# Patient Record
Sex: Male | Born: 1950 | Race: White | Hispanic: No | Marital: Married | Smoking: Former smoker
Health system: Southern US, Community
[De-identification: ages and names within clinical notes are randomized; demographics above are authoritative.]

## PROBLEM LIST (undated history)

## (undated) DIAGNOSIS — R011 Cardiac murmur, unspecified: Secondary | ICD-10-CM

## (undated) DIAGNOSIS — I10 Essential (primary) hypertension: Secondary | ICD-10-CM

## (undated) DIAGNOSIS — H269 Unspecified cataract: Secondary | ICD-10-CM

## (undated) DIAGNOSIS — I4891 Unspecified atrial fibrillation: Secondary | ICD-10-CM

## (undated) DIAGNOSIS — L409 Psoriasis, unspecified: Secondary | ICD-10-CM

## (undated) DIAGNOSIS — I4892 Unspecified atrial flutter: Secondary | ICD-10-CM

## (undated) DIAGNOSIS — G629 Polyneuropathy, unspecified: Secondary | ICD-10-CM

## (undated) DIAGNOSIS — G20 Parkinson's disease: Secondary | ICD-10-CM

## (undated) DIAGNOSIS — G4700 Insomnia, unspecified: Secondary | ICD-10-CM

## (undated) DIAGNOSIS — R600 Localized edema: Secondary | ICD-10-CM

## (undated) DIAGNOSIS — G20C Parkinsonism, unspecified: Secondary | ICD-10-CM

## (undated) DIAGNOSIS — R609 Edema, unspecified: Secondary | ICD-10-CM

## (undated) DIAGNOSIS — Z87440 Personal history of urinary (tract) infections: Secondary | ICD-10-CM

## (undated) DIAGNOSIS — E785 Hyperlipidemia, unspecified: Secondary | ICD-10-CM

## (undated) DIAGNOSIS — K649 Unspecified hemorrhoids: Secondary | ICD-10-CM

## (undated) DIAGNOSIS — N400 Enlarged prostate without lower urinary tract symptoms: Secondary | ICD-10-CM

## (undated) HISTORY — PX: TONSILLECTOMY: SUR1361

## (undated) HISTORY — DX: Parkinsonism, unspecified: G20.C

## (undated) HISTORY — DX: Unspecified atrial fibrillation: I48.91

## (undated) HISTORY — DX: Cardiac murmur, unspecified: R01.1

## (undated) HISTORY — PX: POLYPECTOMY: SHX149

## (undated) HISTORY — DX: Psoriasis, unspecified: L40.9

## (undated) HISTORY — DX: Unspecified atrial flutter: I48.92

## (undated) HISTORY — DX: Essential (primary) hypertension: I10

## (undated) HISTORY — DX: Polyneuropathy, unspecified: G62.9

## (undated) HISTORY — PX: TOOTH EXTRACTION: SUR596

## (undated) HISTORY — DX: Parkinson's disease: G20

## (undated) HISTORY — DX: Personal history of urinary (tract) infections: Z87.440

## (undated) HISTORY — DX: Hyperlipidemia, unspecified: E78.5

---

## 2000-01-01 ENCOUNTER — Encounter: Payer: Self-pay | Admitting: Internal Medicine

## 2003-06-07 ENCOUNTER — Encounter: Admission: RE | Admit: 2003-06-07 | Discharge: 2003-06-07 | Payer: Self-pay | Admitting: Family Medicine

## 2003-06-21 ENCOUNTER — Encounter: Admission: RE | Admit: 2003-06-21 | Discharge: 2003-06-21 | Payer: Self-pay | Admitting: Family Medicine

## 2003-06-28 ENCOUNTER — Encounter: Payer: Self-pay | Admitting: Internal Medicine

## 2003-07-02 ENCOUNTER — Encounter: Payer: Self-pay | Admitting: Internal Medicine

## 2004-11-18 ENCOUNTER — Emergency Department (HOSPITAL_COMMUNITY): Admission: EM | Admit: 2004-11-18 | Discharge: 2004-11-18 | Payer: Self-pay | Admitting: Emergency Medicine

## 2004-12-01 ENCOUNTER — Encounter: Admission: RE | Admit: 2004-12-01 | Discharge: 2004-12-01 | Payer: Self-pay | Admitting: Family Medicine

## 2006-12-03 ENCOUNTER — Ambulatory Visit: Payer: Self-pay | Admitting: Gastroenterology

## 2006-12-20 ENCOUNTER — Ambulatory Visit: Payer: Self-pay | Admitting: Gastroenterology

## 2006-12-20 ENCOUNTER — Encounter: Payer: Self-pay | Admitting: Gastroenterology

## 2006-12-20 ENCOUNTER — Encounter: Payer: Self-pay | Admitting: Internal Medicine

## 2008-02-04 ENCOUNTER — Encounter: Payer: Self-pay | Admitting: Internal Medicine

## 2008-02-04 LAB — CONVERTED CEMR LAB: HDL: 55 mg/dL

## 2008-06-07 ENCOUNTER — Encounter (INDEPENDENT_AMBULATORY_CARE_PROVIDER_SITE_OTHER): Payer: Self-pay | Admitting: *Deleted

## 2008-07-29 ENCOUNTER — Ambulatory Visit: Payer: Self-pay | Admitting: Internal Medicine

## 2008-07-29 DIAGNOSIS — J309 Allergic rhinitis, unspecified: Secondary | ICD-10-CM | POA: Insufficient documentation

## 2008-07-29 DIAGNOSIS — G20 Parkinson's disease: Secondary | ICD-10-CM

## 2008-07-29 DIAGNOSIS — E785 Hyperlipidemia, unspecified: Secondary | ICD-10-CM

## 2008-07-29 DIAGNOSIS — I421 Obstructive hypertrophic cardiomyopathy: Secondary | ICD-10-CM | POA: Insufficient documentation

## 2008-07-30 ENCOUNTER — Telehealth: Payer: Self-pay | Admitting: Internal Medicine

## 2008-07-30 ENCOUNTER — Telehealth (INDEPENDENT_AMBULATORY_CARE_PROVIDER_SITE_OTHER): Payer: Self-pay | Admitting: *Deleted

## 2008-08-12 ENCOUNTER — Telehealth: Payer: Self-pay | Admitting: Internal Medicine

## 2008-08-12 ENCOUNTER — Encounter: Payer: Self-pay | Admitting: Internal Medicine

## 2008-09-25 DIAGNOSIS — I4891 Unspecified atrial fibrillation: Secondary | ICD-10-CM

## 2008-09-25 DIAGNOSIS — I4892 Unspecified atrial flutter: Secondary | ICD-10-CM

## 2008-09-25 HISTORY — DX: Unspecified atrial flutter: I48.92

## 2008-09-25 HISTORY — DX: Unspecified atrial fibrillation: I48.91

## 2008-10-14 ENCOUNTER — Encounter: Payer: Self-pay | Admitting: Internal Medicine

## 2008-10-20 ENCOUNTER — Encounter: Payer: Self-pay | Admitting: Internal Medicine

## 2008-10-22 ENCOUNTER — Emergency Department (HOSPITAL_COMMUNITY): Admission: EM | Admit: 2008-10-22 | Discharge: 2008-10-22 | Payer: Self-pay | Admitting: Emergency Medicine

## 2008-10-22 ENCOUNTER — Ambulatory Visit: Payer: Self-pay | Admitting: Internal Medicine

## 2008-10-22 LAB — CONVERTED CEMR LAB
Glucose, Bld: 149 mg/dL
Hemoglobin: 15.3 g/dL

## 2008-10-28 ENCOUNTER — Encounter: Payer: Self-pay | Admitting: Internal Medicine

## 2008-10-29 ENCOUNTER — Ambulatory Visit: Payer: Self-pay | Admitting: Internal Medicine

## 2008-11-03 ENCOUNTER — Encounter: Payer: Self-pay | Admitting: Internal Medicine

## 2008-11-03 ENCOUNTER — Ambulatory Visit: Payer: Self-pay

## 2008-11-08 ENCOUNTER — Ambulatory Visit: Payer: Self-pay | Admitting: Internal Medicine

## 2008-11-08 DIAGNOSIS — I4891 Unspecified atrial fibrillation: Secondary | ICD-10-CM

## 2008-11-08 DIAGNOSIS — I4892 Unspecified atrial flutter: Secondary | ICD-10-CM

## 2008-11-17 ENCOUNTER — Ambulatory Visit: Payer: Self-pay

## 2008-12-22 ENCOUNTER — Ambulatory Visit: Payer: Self-pay | Admitting: Internal Medicine

## 2008-12-24 ENCOUNTER — Ambulatory Visit: Payer: Self-pay | Admitting: Cardiology

## 2008-12-27 ENCOUNTER — Telehealth: Payer: Self-pay | Admitting: Cardiology

## 2009-01-03 ENCOUNTER — Ambulatory Visit: Payer: Self-pay | Admitting: Cardiology

## 2009-01-03 ENCOUNTER — Encounter: Payer: Self-pay | Admitting: Cardiology

## 2009-01-03 LAB — CONVERTED CEMR LAB
Chloride: 107 meq/L (ref 96–112)
GFR calc non Af Amer: 72.85 mL/min (ref >60)
Glucose, Bld: 116 mg/dL — ABNORMAL HIGH (ref 70–99)
Potassium: 3.9 meq/L (ref 3.5–5.1)
Sodium: 143 meq/L (ref 135–145)

## 2009-02-01 ENCOUNTER — Ambulatory Visit: Payer: Self-pay

## 2009-02-01 ENCOUNTER — Ambulatory Visit: Payer: Self-pay | Admitting: Cardiology

## 2009-02-09 ENCOUNTER — Ambulatory Visit: Payer: Self-pay | Admitting: Cardiology

## 2009-02-14 ENCOUNTER — Ambulatory Visit: Payer: Self-pay | Admitting: Internal Medicine

## 2009-02-18 ENCOUNTER — Ambulatory Visit: Payer: Self-pay | Admitting: Internal Medicine

## 2009-02-22 ENCOUNTER — Encounter (INDEPENDENT_AMBULATORY_CARE_PROVIDER_SITE_OTHER): Payer: Self-pay | Admitting: *Deleted

## 2009-02-22 LAB — CONVERTED CEMR LAB
Cholesterol: 155 mg/dL (ref 0–200)
LDL Cholesterol: 96 mg/dL (ref 0–99)
Triglycerides: 83 mg/dL (ref 0.0–149.0)

## 2009-02-25 ENCOUNTER — Ambulatory Visit: Payer: Self-pay | Admitting: Internal Medicine

## 2009-03-16 ENCOUNTER — Ambulatory Visit: Payer: Self-pay | Admitting: Internal Medicine

## 2009-04-11 ENCOUNTER — Ambulatory Visit: Payer: Self-pay | Admitting: Family

## 2009-04-11 DIAGNOSIS — Z87898 Personal history of other specified conditions: Secondary | ICD-10-CM

## 2009-04-12 ENCOUNTER — Encounter: Payer: Self-pay | Admitting: Family

## 2009-04-18 ENCOUNTER — Ambulatory Visit: Payer: Self-pay | Admitting: Internal Medicine

## 2009-05-10 ENCOUNTER — Telehealth: Payer: Self-pay | Admitting: Internal Medicine

## 2009-07-29 ENCOUNTER — Ambulatory Visit: Payer: Self-pay | Admitting: Cardiology

## 2009-09-01 ENCOUNTER — Telehealth (INDEPENDENT_AMBULATORY_CARE_PROVIDER_SITE_OTHER): Payer: Self-pay | Admitting: *Deleted

## 2009-09-08 ENCOUNTER — Encounter: Payer: Self-pay | Admitting: Internal Medicine

## 2009-11-07 ENCOUNTER — Telehealth: Payer: Self-pay | Admitting: Cardiology

## 2010-02-14 ENCOUNTER — Ambulatory Visit: Payer: Self-pay | Admitting: Cardiology

## 2010-03-06 ENCOUNTER — Telehealth (INDEPENDENT_AMBULATORY_CARE_PROVIDER_SITE_OTHER): Payer: Self-pay | Admitting: *Deleted

## 2010-03-13 ENCOUNTER — Ambulatory Visit: Payer: Self-pay | Admitting: Internal Medicine

## 2010-04-04 ENCOUNTER — Ambulatory Visit: Payer: Self-pay | Admitting: Internal Medicine

## 2010-04-11 ENCOUNTER — Ambulatory Visit: Payer: Self-pay | Admitting: Internal Medicine

## 2010-04-17 LAB — CONVERTED CEMR LAB
AST: 32 units/L (ref 0–37)
Albumin: 4.1 g/dL (ref 3.5–5.2)
Alkaline Phosphatase: 48 units/L (ref 39–117)
CO2: 25 meq/L (ref 19–32)
Eosinophils Relative: 3.6 % (ref 0.0–5.0)
Glucose, Bld: 109 mg/dL — ABNORMAL HIGH (ref 70–99)
HDL: 49.8 mg/dL (ref >39.00)
Monocytes Relative: 9.9 % (ref 3.0–12.0)
Neutrophils Relative %: 54.8 % (ref 43.0–77.0)
PSA: 1.21 ng/mL (ref 0.10–4.00)
Platelets: 121 10*3/uL — ABNORMAL LOW (ref 150.0–400.0)
Potassium: 4 meq/L (ref 3.5–5.1)
Sodium: 138 meq/L (ref 135–145)
Total CHOL/HDL Ratio: 3
Total Protein: 6.1 g/dL (ref 6.0–8.3)
VLDL: 14.8 mg/dL (ref 0.0–40.0)
WBC: 5.3 10*3/uL (ref 4.5–10.5)

## 2010-06-27 NOTE — Assessment & Plan Note (Signed)
Summary: 6 MO F/U/CY  Medications Added NORPACE CR 150 MG XR12H-CAP (DISOPYRAMIDE PHOSPHATE) Take one capsule by mouth twice a day AMOXICILLIN 500 MG CAPS (AMOXICILLIN) 4 tabs pre dental work BETAMETHASONE DIPROPIONATE 0.05 % CREA (BETAMETHASONE DIPROPIONATE) as needed      Allergies Added: NKDA  Visit Type:  6 mo f/u Primary Provider:  Nolon Rod. Paz MD  CC:  no cardiac complaints today.  History of Present Illness: Jeff [name] comes in today for continued evaluation and management of his hypertrophic cardiomyopathy, history of atrial fibrillation/flutter.  He's had no breakthroughs since his last visit. The Norpace is no nausea. He has had no exertional palpitations, presyncope, or chest pain.  We performed an exercise treadmill in September. He had good exercise tolerance, appropriate increase in heart rate and blood pressure, and no arrhythmias.  Current Medications (verified): 1)  Selseb 2.25 % Sham (Selenium Sulf-Pyrithione-Urea) .... As Directed 2)  Westcort 0.2 % Oint (Hydrocortisone Valerate) .... Apply As Directed (Dr Earlene Plater) 3)  Simvastatin 40 Mg Tabs (Simvastatin) .Marland Kitchen.. 1 By Mouth At Bedtime 4)  Singulair 10 Mg Tabs (Montelukast Sodium) .... Once Daily 5)  Metoprolol Succinate 25 Mg Xr24h-Tab (Metoprolol Succinate) .... Take One Tablet By Mouth Daily 6)  Norpace Cr 150 Mg Xr12h-Cap (Disopyramide Phosphate) .... Take One Capsule By Mouth Twice A Day 7)  Aspirin Ec 325 Mg Tbec (Aspirin) .... Take One Tablet By Mouth Daily 8)  Amoxicillin 500 Mg Caps (Amoxicillin) .... 4 Tabs Pre Dental Work 9)  Betamethasone Dipropionate 0.05 % Crea (Betamethasone Dipropionate) .... As Needed  Allergies (verified): No Known Drug Allergies  Past History:  Past Medical History: Last updated: 04/18/2009 Hyperlipidemia hypertryphic obstructive cardio-myopathy ( Dx aprox 2000, was seen @  [facility],  had a ECHO, was offered a cath but declined) h/o Afib-A, h/o A Flutter ---09-2008 h/p  psoriais Allergic rhinitis  Past Surgical History: Last updated: 07/29/2008 Tonsillectomy  Family History: Last updated: 01/27/2009 CAD - no heart murmur--brother  HTN - no DM - no stroke - no colon Ca -no prostate Ca -no M - living F - living Alz  Social History: Last updated: 07/29/2008 Occupation: Runner, 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) , now works at [employer] and [employer] a bike  in [place] since 1974  Married no children  Risk Factors: Alcohol Use: 1 (07/29/2008)  Risk Factors: Smoking Status: current (07/29/2008)  Review of Systems       negative history of present illness  Vital Signs:  Patient profile:   60 year old male Height:      75.5 inches Weight:      223 pounds BMI:     27.60 Pulse rate:   76 / minute Pulse rhythm:   regular BP sitting:   92 / 60  (left arm)  Vitals Entered By: Jeff [name], CMA (July 29, 2009 10:16 AM)  Physical Exam  General:  Well developed, well nourished, in no acute distress. Head:  normocephalic and atraumatic Eyes:  PERRLA/EOM intact; conjunctiva and lids normal. Neck:  Neck supple, no JVD. No masses, thyromegaly or abnormal cervical nodes. Chest Tationa Stech:  no deformities or breast masses noted Lungs:  Clear bilaterally to auscultation and percussion. Heart:  PMI dynamic, systolic murmur along left sternal border, regular rate and rhythm, S2 split Abdomen:  Bowel sounds positive; abdomen soft and non-tender without masses, organomegaly, or hernias noted. No hepatosplenomegaly. Msk:  Back normal, normal gait. Muscle strength and tone normal. Pulses:  pulses normal in all 4 extremities Extremities:  No clubbing or cyanosis. Neurologic:  Alert and oriented x 3. Skin:  Intact without lesions or rashes. Psych:  Normal affect.   Impression & Recommendations:  Problem # 1:  ATRIAL FIBRILLATION (ICD-427.31) Assessment Improved  His updated medication list for this  problem includes:    Metoprolol Succinate 25 Mg Xr24h-tab (Metoprolol succinate) .Marland Kitchen... Take one tablet by mouth daily    Norpace Cr 150 Mg Xr12h-cap (Disopyramide phosphate) .Marland Kitchen... Take one capsule by mouth twice a day    Aspirin Ec 325 Mg Tbec (Aspirin) .Marland Kitchen... Take one tablet by mouth daily  Problem # 2:  ATRIAL FLUTTER (ICD-427.32) Assessment: Improved  His updated medication list for this problem includes:    Metoprolol Succinate 25 Mg Xr24h-tab (Metoprolol succinate) .Marland Kitchen... Take one tablet by mouth daily    Norpace Cr 150 Mg Xr12h-cap (Disopyramide phosphate) .Marland Kitchen... Take one capsule by mouth twice a day    Aspirin Ec 325 Mg Tbec (Aspirin) .Marland Kitchen... Take one tablet by mouth daily  Problem # 3:  HYPERTROPHIC OBSTRUCTIVE CARDIOMYOPATHY (ICD-425.1) Assessment: Unchanged  His updated medication list for this problem includes:    Metoprolol Succinate 25 Mg Xr24h-tab (Metoprolol succinate) .Marland Kitchen... Take one tablet by mouth daily    Norpace Cr 150 Mg Xr12h-cap (Disopyramide phosphate) .Marland Kitchen... Take one capsule by mouth twice a day    Aspirin Ec 325 Mg Tbec (Aspirin) .Marland Kitchen... Take one tablet by mouth daily  Patient Instructions: 1)  Your physician recommends that you schedule a follow-up appointment in: 6 months with dr Melaya Hoselton due sept 2)  Your physician recommends that you continue on your current medications as directed. Please refer to the Current Medication list given to you today.

## 2010-06-27 NOTE — Assessment & Plan Note (Signed)
Summary: cpx/kn   Vital Signs:  Patient profile:   60 year old male Height:      75.5 inches Weight:      225.13 pounds Pulse rate:   78 / minute Pulse rhythm:   regular BP sitting:   116 / 80  (left arm) Cuff size:   large  Vitals Entered By: Army Fossa CMA (April 04, 2010 1:15 PM) CC: CPX, not fasting Comments c/o right bicep pain x 3 weeks Tremors in right hand tingle in left leg [facility] [address] declines TD    History of Present Illness: CPX  R biceps pain x 3 weeks after he tried to reach across   tremor R hand is  increasing  tingling R leg , started a few months ago, symptom is now extending to the knee and is almost constant. Tingling described as the feeling one have  after a leg get sleepy  Preventive Screening-Counseling & Management  Caffeine-Diet-Exercise     Does Patient Exercise: yes     Times/week: 2  Current Medications (verified): 1)  Selseb 2.25 % Sham (Selenium Sulf-Pyrithione-Urea) .... As Directed 2)  Westcort 0.2 % Oint (Hydrocortisone Valerate) .... Apply As Directed (Dr Earlene Plater) 3)  Simvastatin 40 Mg Tabs (Simvastatin) .Marland Kitchen.. 1 By Mouth At Bedtime 4)  Singulair 10 Mg Tabs (Montelukast Sodium) .... Once Daily 5)  Metoprolol Succinate 25 Mg Xr24h-Tab (Metoprolol Succinate) .... Take One Tablet By Mouth Daily 6)  Norpace Cr 150 Mg Xr12h-Cap (Disopyramide Phosphate) .... Take One Capsule By Mouth Twice A Day 7)  Aspirin Ec 325 Mg Tbec (Aspirin) .... Take One Tablet By Mouth Daily 8)  Amoxicillin 500 Mg Caps (Amoxicillin) .... 4 Tabs Pre Dental Work 9)  Betamethasone Dipropionate 0.05 % Crea (Betamethasone Dipropionate) .... As Needed 10)  Dhs Zinc Shampoo  Allergies (verified): No Known Drug Allergies  Past History:  Past Medical History: Reviewed history from 04/18/2009 and no changes required. Hyperlipidemia hypertryphic obstructive cardio-myopathy ( Dx aprox 2000, was seen @  [facility],  had a ECHO, was offered a cath but  declined) h/o Afib-A, h/o A Flutter ---09-2008 h/p psoriais Allergic rhinitis  Past Surgical History: Reviewed history from 07/29/2008 and no changes required. Tonsillectomy  Family History: Reviewed history from 01/27/2009 and no changes required. 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 Resp:  Denies cough and shortness of breath. GI:  Denies bloody stools, diarrhea, nausea, and vomiting. GU:  Denies dysuria, hematuria, urinary frequency, and urinary hesitancy. Neuro:  no back pain No bladder or bowel incontinence No headaches No visual disturbances. Psych:  Denies anxiety and depression.  Physical Exam  General:  alert, well-developed, and well-nourished.   Lungs:  Normal respiratory effort, chest expands symmetrically. Lungs are clear to auscultation, no crackles or wheezes. Heart:  grade 2-3 systolic murmur noted Abdomen:  soft, non-tender, no distention, no masses, no guarding, and no rigidity.   Rectal:  No external abnormalities noted. Normal sphincter tone. No rectal masses or tenderness. Prostate:  Prostate  gland firm and smooth, no enlargement, nodularity, tenderness, mass, asymmetry or induration. Msk:  inspection and palpation of the upper extremities muscles normal Extremities:  No  lower  extremity edema Neurologic:  alert & oriented X3, cranial nerves II-XII intact (chronic, slt weaker L up aper eyelid ), strength normal in all extremities, sensation intact to light touch, sensation intact to  pinprick, gait normal, and DTRs symmetrical , slr decrease ankle jerk B  Psych:  not anxious appearing and not depressed appearing.     Impression & Recommendations:  Problem # 1:  ROUTINE GENERAL MEDICAL EXAM@HEALTH  CARE FACL (ICD-V70.0) Td  ~ 2009  (patient no sure) had a flu shot  Colonoscopy:   Date:  11/24/202007   Results:  polyp  Next colonoscopy per GI  diet, exercise discussed Labs  Problem # 2:  ESSENTIAL AND OTHER SPECIFIED FORMS OF TREMOR (ICD-333.1) tremor getting worse has an unexplained paresthesia in the  R  leg Plan   referral to neurology ([place])  Orders: Neurology Referral (Neuro)  Problem # 3:  right arm sprain rest, Motrin  Complete Medication List: 1)  Selseb 2.25 % Sham (Selenium sulf-pyrithione-urea) .... As directed 2)  Westcort 0.2 % Oint (Hydrocortisone valerate) .... Apply as directed (dr Earlene Plater) 3)  Simvastatin 40 Mg Tabs (Simvastatin) .Marland Kitchen.. 1 by mouth at bedtime 4)  Singulair 10 Mg Tabs (Montelukast sodium) .... Once daily 5)  Metoprolol Succinate 25 Mg Xr24h-tab (Metoprolol succinate) .... Take one tablet by mouth daily 6)  Norpace Cr 150 Mg Xr12h-cap (Disopyramide phosphate) .... Take one capsule by mouth twice a day 7)  Aspirin Ec 325 Mg Tbec (Aspirin) .... Take one tablet by mouth daily 8)  Amoxicillin 500 Mg Caps (Amoxicillin) .... 4 tabs pre dental work 9)  Betamethasone Dipropionate 0.05 % Crea (Betamethasone dipropionate) .... As needed 10)  Dhs Zinc Shampoo   Patient Instructions: 1)  come back fasting 2)  FLP, CBC, BMP, LFTs, TSH, PSA--- dx-- v70 3)  B12, folic acid, VDRL--- dx  paresthesia 4)  Please schedule a follow-up appointment in 6 months .  Prescriptions: SELSEB 2.25 % SHAM (SELENIUM SULF-PYRITHIONE-UREA) as directed  #120 Millilit x 5   Entered by:   Army Fossa CMA   Authorized by:   Nolon Rod. Paz MD   Signed by:   Nolon Rod. Paz MD on 04/05/2010   Method used:   Electronically to        [id number]  (retail)       [address] [place], [place]  [address]       Ph: [phone]       Fax: [phone]   RxID:   [id number] SIMVASTATIN 40 MG TABS (SIMVASTATIN) 1 by mouth at bedtime  #90 Each x 2   Entered by:   Army Fossa CMA   Authorized by:   Nolon Rod. Paz MD   Signed by:   Nolon Rod. Paz MD on 04/05/2010   Method used:   Electronically to        [id number]* (retail)       [address] [place], [place]  [address]       Ph: [phone]       Fax: [phone]   RxID:   [id number]    Orders Added: 1)  Neurology Referral [Neuro] 2)  Est. Patient age 53-64 [96]  +   Risk Factors:  Exercise:  yes    Times per week:  2

## 2010-06-27 NOTE — Progress Notes (Signed)
Summary: appt--lmom 10/12  Phone Note Outgoing Call   Summary of Call: Please call pt and have pt schedule a CPX.  Initial call taken by: Army Fossa CMA,  March 06, 2010 4:48 PM  Follow-up for Phone Call        LMOM TO CALL BACK TO SCHEDULE CPX.Marland KitchenMarland KitchenJerolyn [name]  March 08, 2010 12:16 PM  CPX SCHED FOR TUES 11/8 WITH DR PAZ.Marland KitchenMarland KitchenJerolyn [name]  March 21, 2010 9:15 AM

## 2010-06-27 NOTE — Progress Notes (Signed)
Summary: Prior Auth APPROVEDfor  singulair--[employer]  Phone Note Refill Request Message from:  Pharmacy on [facility] on [address] Fax #: [phone]  Refills Requested: Medication #1:  SINGULAIR 10 MG TABS once daily   Dosage confirmed as above?Dosage Confirmed   Supply Requested: 3 months   Last Refilled: 07/19/2009 Prior Auth [id number] ID#: [id number]  Next Appointment Scheduled: none Initial call taken by: Harold Barban,  September 01, 2009 11:56 AM  Follow-up for Phone Call        prior auth in process Case [id number] .Kandice Hams  September 02, 2009 3:55 PM  Left msg for pt to call  verify has every tried any other meds for allergies .Kandice Hams  September 06, 2009 9:44 AM  patient return called says he has tried zyrtec and allegra in the past and they didn't help says singulair worked best and if any additional infromation needed to give patient a call........Marland KitchenDoristine Devoid  September 07, 2009 4:34 PM    Additional Follow-up for Phone Call Additional follow up Details #1::        Prior auth APPROVED for singulair  from 08/18/09 until 09/08/2010.  Kandice Hams  September 09, 2009 9:22 AM  Additional Follow-up by: Kandice Hams,  September 09, 2009 9:22 AM

## 2010-06-27 NOTE — Assessment & Plan Note (Signed)
Summary: flu shot/kn   Nurse Visit  CC: flu shot./kb   Allergies: No Known Drug Allergies  Orders Added: 1)  Admin 1st Vaccine [90471] 2)  Flu Vaccine 62yrs + [66440]            Flu Vaccine Consent Questions     Do you have a history of severe allergic reactions to this vaccine? no    Any prior history of allergic reactions to egg and/or gelatin? no    Do you have a sensitivity to the preservative Thimersol? no    Do you have a past history of Guillan-Barre Syndrome? no    Do you currently have an acute febrile illness? no    Have you ever had a severe reaction to latex? no    Vaccine information given and explained to patient? yes    Are you currently pregnant? no    Lot Number:[id number]   Exp Date:11/25/2010   Site Given  Left Deltoid IMu

## 2010-06-27 NOTE — Medication Information (Signed)
Summary: Prior Authorization & Approval for Singulair/[employer]  Prior Authorization & Approval for Singulair/[employer]   Imported By: Lanelle Bal 09/14/2009 10:16:12  _____________________________________________________________________  External Attachment:    Type:   Image     Comment:   External Document

## 2010-06-27 NOTE — Progress Notes (Signed)
Summary: ? about medication; need Dr. Daleen Squibb to review  js   Phone Note Call from Patient Call back at Home Phone [phone]   Caller: Patient Summary of Call: Pt have question about medication (Norpace CR 150mg  ) Initial call taken by: Judie Grieve,  November 07, 2009 11:11 AM  Follow-up for Phone Call        Pt said the Norpace CR is really expensive and would like to know if there is a generic of it available or another medication that would do the same thing but would be cheaper.  He has 2 month's worth left right now but wanted  to ask about this before he runs out. I told him that Dr. Daleen Squibb isn't in the office today but is here tomorrow, 6/14, and I would pass this on to him and also someone who would make sure he sees this message.  I told him if we don't call him by Friday 6/17 to please call us back to see where things stand with this. He verbalized understanding of these instructions. Follow-up by: Minerva Areola, RN, BSN,  November 07, 2009 11:21 AM  Additional Follow-up for Phone Call Additional follow up Details #1::        He can but will have to take every 6 hours. Additional Follow-up by: Gaylord Shih, MD, [facility],  November 08, 2009 10:05 AM     Appended Document: ? about medication; need Dr. Daleen Squibb to review  js LMTCB.  Appended Document: ? about medication; need Dr. Daleen Squibb to review  js PER PT WILL CONT WITH NORPACE AT THIS TIME  DUE TO CONVENIENCE OF ONLY TAKING two times a day VERSES Q6HOURS . WILL CALL BACK IF WISHES TO CHANGE AT A LATER DATE.

## 2010-06-27 NOTE — Assessment & Plan Note (Signed)
Summary: 6 mo f/u ./cy      Allergies Added: NKDA  Visit Type:  6 mo f/u Primary Provider:  Nolon Rod. Paz MD  CC:  no cardiac complaints today....pt c/o right arm pain but states this feels muscular to him.  History of Present Illness: [name] returns today for management of his hypertrophic obstructive cardiomyopathy and paroxysmal atrial fibrillation.  He continues to note his yard and wall 2030 minutes on flat ground without any shortness of breath, presyncope, syncope or chest pain. He had a low risk exercise treadmill last year. His last echocardiogram showed a resting gradient of 100 with Sam and mild mitral regurgitation. He is moderate to severe concentric hypertrophy.  He has not had a recurrence of his atrial fib on Norpace.  His lipids were at goal on simvastatin with Dr.Paz. I reviewed this with the patient.  Current Medications (verified): 1)  Selseb 2.25 % Sham (Selenium Sulf-Pyrithione-Urea) .... As Directed 2)  Westcort 0.2 % Oint (Hydrocortisone Valerate) .... Apply As Directed (Dr Earlene Plater) 3)  Simvastatin 40 Mg Tabs (Simvastatin) .Marland Kitchen.. 1 By Mouth At Bedtime 4)  Singulair 10 Mg Tabs (Montelukast Sodium) .... Once Daily 5)  Metoprolol Succinate 25 Mg Xr24h-Tab (Metoprolol Succinate) .... Take One Tablet By Mouth Daily 6)  Norpace Cr 150 Mg Xr12h-Cap (Disopyramide Phosphate) .... Take One Capsule By Mouth Twice A Day 7)  Aspirin Ec 325 Mg Tbec (Aspirin) .... Take One Tablet By Mouth Daily 8)  Amoxicillin 500 Mg Caps (Amoxicillin) .... 4 Tabs Pre Dental Work 9)  Betamethasone Dipropionate 0.05 % Crea (Betamethasone Dipropionate) .... As Needed  Allergies (verified): No Known Drug Allergies  Past History:  Past Medical History: Last updated: 04/18/2009 Hyperlipidemia hypertryphic obstructive cardio-myopathy ( Dx aprox 2000, was seen @  [place],  had a ECHO, was offered a cath but declined) h/o Afib-A, h/o A Flutter ---09-2008 h/p psoriais Allergic rhinitis  Past  Surgical History: Last updated: 07/29/2008 Tonsillectomy  Family History: Last updated: 01/27/2009 CAD - no heart murmur--brother  HTN - no DM - no stroke - no colon Ca -no prostate Ca -no M - living F - living Alz  Social History: Last updated: 07/29/2008 Occupation: Runner, 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) , now works at [employer] and [employer] a bike  in [place] since 1974  Married no children  Risk Factors: Alcohol Use: 1 (07/29/2008)  Risk Factors: Smoking Status: current (07/29/2008)  Review of Systems       negative other than history of present illness  Vital Signs:  Patient profile:   60 year old male Height:      75.5 inches Weight:      226.8 pounds BMI:     28.07 Pulse rate:   55 / minute Pulse rhythm:   irregular BP sitting:   106 / 70  (left arm) Cuff size:   large  Vitals Entered By: Danielle Rankin, CMA (February 14, 2010 11:52 AM)  Physical Exam  General:  Well developed, well nourished, in no acute distress. Head:  normocephalic and atraumatic Eyes:  PERRLA/EOM intact; conjunctiva and lids normal. Neck:  Neck supple, no JVD. No masses, thyromegaly or abnormal cervical nodes. Chest Wall:  no deformities or breast masses noted Lungs:  Clear bilaterally to auscultation and percussion. Heart:  PMI dynamic and nondisplaced, systolic murmur unchanged, bisferiens carotid pulse, regular rate and rhythm, normal S1-S2 Msk:  Back normal, normal gait. Muscle strength and tone normal. Pulses:  pulses normal in all 4 extremities Extremities:  No clubbing or cyanosis. Neurologic:  Alert and oriented x 3.   EKG  Procedure date:  02/14/2010  Findings:      sinus bradycardia, left axis deviation, LVH with repolarization changes, QTC 0.46  Impression & Recommendations:  Problem # 1:  ATRIAL FIBRILLATION (ICD-427.31) Assessment Improved  He is doing well on Norpace. No change. His updated  medication list for this problem includes:    Metoprolol Succinate 25 Mg Xr24h-tab (Metoprolol succinate) .Marland Kitchen... Take one tablet by mouth daily    Norpace Cr 150 Mg Xr12h-cap (Disopyramide phosphate) .Marland Kitchen... Take one capsule by mouth twice a day    Aspirin Ec 325 Mg Tbec (Aspirin) .Marland Kitchen... Take one tablet by mouth daily  Orders: EKG w/ Interpretation (93000)  Problem # 2:  HYPERTROPHIC OBSTRUCTIVE CARDIOMYOPATHY (ICD-425.1) Assessment: Unchanged He is asymptomatic. Has a low risk by history and stress test. We'll continue current medications followup in one year. His updated medication list for this problem includes:    Metoprolol Succinate 25 Mg Xr24h-tab (Metoprolol succinate) .Marland Kitchen... Take one tablet by mouth daily    Norpace Cr 150 Mg Xr12h-cap (Disopyramide phosphate) .Marland Kitchen... Take one capsule by mouth twice a day    Aspirin Ec 325 Mg Tbec (Aspirin) .Marland Kitchen... Take one tablet by mouth daily  Problem # 3:  HYPERLIPIDEMIA (ICD-272.4)  His updated medication list for this problem includes:    Simvastatin 40 Mg Tabs (Simvastatin) .Marland Kitchen... 1 by mouth at bedtime  Patient Instructions: 1)  Your physician recommends that you schedule a follow-up appointment in: 12 months with dr wall 2)  Your physician recommends that you continue on your current medications as directed. Please refer to the Current Medication list given to you today.

## 2010-09-05 LAB — DIFFERENTIAL
Basophils Absolute: 0 10*3/uL (ref 0.0–0.1)
Basophils Relative: 0 % (ref 0–1)
Eosinophils Absolute: 0.1 10*3/uL (ref 0.0–0.7)
Eosinophils Relative: 1 % (ref 0–5)
Monocytes Absolute: 0.7 10*3/uL (ref 0.1–1.0)
Neutro Abs: 4.5 10*3/uL (ref 1.7–7.7)

## 2010-09-05 LAB — POCT CARDIAC MARKERS: Troponin i, poc: <0.05 ng/mL (ref 0.00–0.09)

## 2010-09-05 LAB — COMPREHENSIVE METABOLIC PANEL
ALT: 59 U/L — ABNORMAL HIGH (ref 0–53)
Albumin: 3.8 g/dL (ref 3.5–5.2)
Alkaline Phosphatase: 47 U/L (ref 39–117)
BUN: 14 mg/dL (ref 6–23)
Chloride: 106 mEq/L (ref 96–112)
Potassium: 4.8 mEq/L (ref 3.5–5.1)
Sodium: 137 mEq/L (ref 135–145)
Total Bilirubin: 0.8 mg/dL (ref 0.3–1.2)

## 2010-09-05 LAB — CK TOTAL AND CKMB (NOT AT ARMC): Relative Index: 9.5 — ABNORMAL HIGH (ref 0.0–2.5)

## 2010-09-05 LAB — CBC
HCT: 39.2 % (ref 39.0–52.0)
Hemoglobin: 13.3 g/dL (ref 13.0–17.0)
MCHC: 34 g/dL (ref 30.0–36.0)
RDW: 13.7 % (ref 11.5–15.5)

## 2010-09-24 ENCOUNTER — Other Ambulatory Visit: Payer: Self-pay | Admitting: Internal Medicine

## 2010-10-10 NOTE — H&P (Signed)
[name], [name]                ACCOUNT NO.:  [id number]   MEDICAL RECORD NO.:  [id number]          PATIENT TYPE:  EMS   LOCATION:  MAJO                         FACILITY:  [facility]   PHYSICIAN:  Bevelyn Buckles. Bensimhon, MDDATE OF BIRTH:  1950/08/02   DATE OF ADMISSION:  10/22/2008  DATE OF DISCHARGE:                              HISTORY & PHYSICAL   PRIMARY CARDIOLOGIST:  Jeff Fus C. Jeff Squibb, MD, [facility]   PRIMARY CARE:  Previously Jeff Skye. Kindl, MD, is now Jeff Ora, MD   HISTORY OF PRESENT ILLNESS:  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  Apparently, had a workup  with Dr. Daleen [name] back in 2005.  Initially in 2001, the patient had been  fairly asymptomatic with a hypertrophic obstructive cardiomyopathy.  It  was noted he had a fair amount of ventricular ectopy, which was also  asymptomatic according to Dr. Anola [name]' previous notes.  Back in 2005, the  patient had a stress Myoview that showed an ejection fraction of 34%  with question of anterior and lateral ischemia.  Cardiac catheterization  was recommended, but the patient declined at that time.  A 2D  echocardiogram was also done that showed 4 meters per second outflow  tract gradient, systolic anterior motion of the mitral wall.  Near  cavity obliteration of left ventricle with hyperdynamic left ventricular  function, and severe concentric left ventricular hypertrophy.  Left  atrium was also noted to be mildly dilated.  The patient states he did  quite well for a while and had no further problems.  Continued to remain  fairly active, does a lot of walking.  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  He was in his usual state of health this morning, got up, felt little  dizzy initially, but  went on to work.  En route to work, had some  dizziness that pulled over lasted 3-4 minutes, resolved.  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  States he felt this just  wave come through him that was intermittent for three episodes.  He  calls it a wave of dizziness and some galloping sensation in his chest  that spontaneously broke.  He had no discomfort with that and did not  have a syncopal episode.  He decided he probably need to be evaluated,  so he went ahead and came on into the emergency room.  Here in the ER,  he was noted to be in a fast heart rate, questionable SVT, questionable  atrial flutter.  He was given a low dose of Lopressor and broke in sinus  rhythm since we have been here to evaluate him.  He was given four baby  aspirin by mouth.  The patient did not share any symptoms with [place], but  in reviewing documentation that Dr. Drue [name] was graciously brought in with  the patient there at his office this morning, he complained of being  dizzy, cold, and clammy.  His blood pressure in their office was 80/60.  He also complained of being nauseated.  I suspect the patient has down-  played some of his symptoms and states he has not had any of those  problems now.   PAST MEDICAL HISTORY:  HOCM, ventricular ectopy, LV dysfunction with EF  previously documented 35% with abnormal stress Myoview with the patient  declining cardiac catheterization as stated back in 2001 or 2005.   SOCIAL HISTORY:  Lives in [place] with his wife.  He is a Therapist, sports.  Denies any tobacco use.  Has occasional beer.  Denies any  illicit substance use.  Does a lot of walking for exercise.   FAMILY HISTORY:  Mother alive and well.  Father had a history of HOCM  and has Alzheimer's and both his and sister and brother had been  diagnosed with HOCM.   REVIEW OF SYSTEMS:  Positive for dizziness and palpitation type symptoms  in his chest.  All other systems reviewed are  negative.   ALLERGIES:  No known drug allergies.   CURRENT MEDICATIONS:  Zocor 40, Singulair and amoxicillin p.r.n.   PHYSICAL EXAMINATION:  VITAL SIGNS:  Temperature 98.4; heart rate  initially 140s, now in 118 and he is in the 70s now; respirations are  18; blood pressures ranging from 99/64 to 116/95; sat 99% on 2 L.  GENERAL:  In no acute distress.  HEENT:  Unremarkable.  NECK:  Supple without lymphadenopathy, bruit, or JVD.  CARDIOVASCULAR:  S1-S2.  He has a 2/6 systolic ejection murmur noted,  also noted 2/6 murmur pronounced with Valsalva maneuver.  LUNGS:  Clear to auscultation bilaterally.  ABDOMEN:  Soft, nontender.  Positive bowel sounds.  EXTREMITIES:  Lower extremity without clubbing, cyanosis, or edema.  NEUROLOGIC:  Alert and oriented x3.   Chest x-ray showed no acute findings.  EKG currently sinus rhythm in the  70s.   LABORATORY DATA:  Pending.  I do have a hematocrit of 39.2.   IMPRESSION:  Supraventricular tachycardia, questionable atrial flutter;  however, broke with Lopressor, currently in sinus rhythm.  No further  episodes.  Also, history of dyslipidemia and HOCM with several family  members of same diagnosis.  Dr. Gala [name] has been in to evaluate the  patient.  We have reviewed his history, noted previous abnormal stress  Myoview with the patient declining catheterization.  Currently in sinus  rhythm with no further symptoms.  We are going to observe him for a  couple of hours.  If no further episodes of SVT, he can ago home given  him his prescription for Toprol-XL 50 mg daily.  The only outpatient  workup, I have spoken with the office and Jeff [name] has been gracious enough  to hang around in the office and set him up with an outpatient event  monitor and then I have scheduled him for a 2-D echocardiogram on June 9  at 11:30.  He will need an EP evaluation.  I have set him up with Dr.  Ladona [name] on June 14 at 2:30 and then last, but not least Dr. Daleen [name] on July   30, at 2:45 showed Jeff [name] has the prescription for the Toprol-XL to  take once a day.  I have also instructed him he continues it for p.r.n.  if needed.  Watch his blood pressure closely and I have cleared him to  return  to work without restrictions, but he is to avoid caffeine and if  this occurs again and he is symptomatic, he needs to go ahead and come  to the emergency room for further evaluation.  The patient and spouse  verbalized understanding, have all the followup discharge instructions.  Greater than 30 minutes spent educating the patient and getting him set  up outpatient.  I have also given him a copy of several of his EKG he  had done here in by EMS or by Dr. Drue [name]' office to bring to the office and  share with Jeff [name].      Dorian Pod, ACNP      Bevelyn Buckles. Bensimhon, MD  Electronically Signed   MB/MEDQ  D:  10/22/2008  T:  10/23/2008  Job:  [id number]

## 2010-11-08 ENCOUNTER — Telehealth: Payer: Self-pay | Admitting: Cardiology

## 2010-11-08 ENCOUNTER — Other Ambulatory Visit: Payer: Self-pay | Admitting: *Deleted

## 2010-11-08 ENCOUNTER — Other Ambulatory Visit: Payer: Self-pay | Admitting: Internal Medicine

## 2010-11-08 MED ORDER — DISOPYRAMIDE PHOSPHATE 150 MG PO CAPS: 150.0000 mg | ORAL_CAPSULE | Freq: Two times a day (BID) | ORAL | Status: DC

## 2010-11-08 NOTE — Telephone Encounter (Signed)
Spoke with Satya at [facility] on [address]. They do not have Norpace and are unable to get it from any other stores. Norpace is on back order until end of June.  Spoke with [employer] at [place] and they can get one bottle of 100 capsules for pt tomorrow. I spoke with pt and he would like to proceed with filling prescription at [facility]. He has enough capsules for 2 days in current supply. Prescription for Norpace CR 150 mg po twice daily called to [facility]Pt will call pharmacy tomorrow before picking up to make sure it has arrived in store.

## 2010-11-08 NOTE — Telephone Encounter (Signed)
Okay   1 months supply and 6 refills

## 2010-11-08 NOTE — Telephone Encounter (Signed)
Pt is out of Norpace ER 150mg  capsules BID and he went to [facility] on [address] and [address] and they are out and will not have any in for a week. Please call pt to advise what he can do

## 2010-11-13 ENCOUNTER — Telehealth: Payer: Self-pay | Admitting: Cardiology

## 2010-11-13 NOTE — Telephone Encounter (Signed)
Pt calls to make sure he can take the generic norpace. Okay to take generic.   Mylo Red RN

## 2010-11-13 NOTE — Telephone Encounter (Signed)
Discuss norpace medication.

## 2010-11-20 ENCOUNTER — Other Ambulatory Visit: Payer: Self-pay | Admitting: Cardiology

## 2011-01-05 ENCOUNTER — Encounter: Payer: Self-pay | Admitting: Cardiology

## 2011-01-14 ENCOUNTER — Other Ambulatory Visit: Payer: Self-pay | Admitting: Cardiology

## 2011-01-15 ENCOUNTER — Other Ambulatory Visit: Payer: Self-pay | Admitting: Cardiology

## 2011-02-01 ENCOUNTER — Ambulatory Visit (INDEPENDENT_AMBULATORY_CARE_PROVIDER_SITE_OTHER): Payer: BC Managed Care – PPO | Admitting: Cardiology

## 2011-02-01 ENCOUNTER — Encounter: Payer: Self-pay | Admitting: Cardiology

## 2011-02-01 DIAGNOSIS — I4891 Unspecified atrial fibrillation: Secondary | ICD-10-CM

## 2011-02-01 DIAGNOSIS — I4892 Unspecified atrial flutter: Secondary | ICD-10-CM

## 2011-02-01 DIAGNOSIS — I421 Obstructive hypertrophic cardiomyopathy: Secondary | ICD-10-CM

## 2011-02-01 NOTE — Progress Notes (Signed)
HPI Jeff [name] returns today for evaluation and management of his hypertrophic cardiomyopathy and paroxysmal A. Fib. He has been well controlled on metoprolol and Norpace. He is absolutely asymptomatic. Specifically denies any chest pain, palpitations, presyncope, dyspnea on exertion.  He's very compliant with his medications. Primary care falls his blood work.  His EKG shows sinus bradycardia with a first degree AV block. He has this lump on the branch block pattern stable.  We performed a treadmill 2 years ago. He did very well. Low-risk study. Past Medical History  Diagnosis Date  . Hyperlipidemia   . Hypertrophic obstructive cardiomyopathy   . Atrial fibrillation 09/2008    hx of  . Atrial flutter 09/2008    hx of  . Psoriasis   . Allergic rhinitis   . Hx of colonoscopy     Past Surgical History  Procedure Date  . Tonsillectomy     Family History  Problem Relation Age of Onset  . Coronary artery disease Neg Hx   . Heart murmur Brother   . Hypertension Neg Hx   . Diabetes Neg Hx   . Stroke Neg Hx   . Colon cancer Neg Hx   . Prostate cancer Neg Hx   . Alzheimer's disease Father     History   Social History  . Marital Status: Married    Spouse Name: N/A    Number of Children: 0  . Years of Education: N/A   Occupational History  . [employer] and [employer]    Social History Main Topics  . Smoking status: Former Games developer  . Smokeless tobacco: Not on file  . Alcohol Use: Yes     rare  . Drug Use: Not on file  . Sexually Active: Not on file   Other Topics Concern  . Not on file   Social History Narrative  . No narrative on file    No Known Allergies  Current Outpatient Prescriptions  Medication Sig Dispense Refill  . aspirin EC 325 MG tablet Take 325 mg by mouth daily.        . betamethasone dipropionate (DIPROLENE) 0.05 % cream Apply topically as needed.        . disopyramide (NORPACE) 150 MG capsule Take 1 capsule (150 mg total) by mouth  2 (two) times daily.  180 capsule  3  . hydrocortisone valerate (WEST-CORT) 0.2 % ointment Apply as directed (Dr. Earlene Plater)       . metoprolol succinate (TOPROL-XL) 25 MG 24 hr tablet TAKE 1 TABLET BY MOUTH DAILY  90 tablet  1  . selenium sulfide (SELSUN) 2.5 % shampoo USE AS DIRECTED  120 mL  5  . simvastatin (ZOCOR) 40 MG tablet Take 40 mg by mouth at bedtime.        Marland Kitchen SINGULAIR 10 MG tablet TAKE 1 TABLET BY MOUTH DAILY  90 tablet  0  . amoxicillin (AMOXIL) 500 MG capsule 4 tabs pre dental work         ROS Negative other than HPI.   PE General Appearance: well developed, well nourished in no acute distress HEENT: symmetrical face, PERRLA, good dentition  Neck: no JVD, thyromegaly, or adenopathy, trachea midline Chest: symmetric without deformity Cardiac: PMI non-displaced, RRR normal S1, paradoxical S2, no gallop, midsystolic murmur along the left sternal border, unchanged. Lung: clear to ausculation and percussion Vascular: all pulses full without bruits  Abdominal: nondistended, nontender, good bowel sounds, no HSM, no bruits Extremities: no cyanosis, clubbing or edema, no sign  of DVT, no varicosities  Skin: normal color, no rashes Neuro: alert and oriented x 3, non-focal Pysch: normal affect Filed Vitals:   02/01/11 1110  BP: 104/78  Pulse: 64  Height: 6\' 4"  (1.93 m)  Weight: 221 lb (100.245 kg)    EKG  Labs and Studies Reviewed.   Lab Results  Component Value Date   WBC 5.3 04/11/2010   HGB 13.9 04/11/2010   HCT 40.3 04/11/2010   MCV 88.6 04/11/2010   PLT 121.0* 04/11/2010      Chemistry      Component Value Date/Time   NA 138 04/11/2010 0945   K 4.0 04/11/2010 0945   CL 105 04/11/2010 0945   CO2 25 04/11/2010 0945   BUN 14 04/11/2010 0945   CREATININE 1.1 04/11/2010 0945      Component Value Date/Time   CALCIUM 8.8 04/11/2010 0945   ALKPHOS 48 04/11/2010 0945   AST 32 04/11/2010 0945   ALT 29 04/11/2010 0945   BILITOT 1.0 04/11/2010 0945       Lab  Results  Component Value Date   CHOL 161 04/11/2010   CHOL 155 02/18/2009   CHOL 154 02/04/2008   Lab Results  Component Value Date   HDL 49.80 04/11/2010   HDL 42.50 02/18/2009   HDL 55 02/04/2008   Lab Results  Component Value Date   LDLCALC 96 04/11/2010   LDLCALC 96 02/18/2009   LDLCALC 82 02/04/2008   Lab Results  Component Value Date   TRIG 74.0 04/11/2010   TRIG 83.0 02/18/2009   TRIG 83 02/04/2008   Lab Results  Component Value Date   CHOLHDL 3 04/11/2010   CHOLHDL 4 02/18/2009   No results found for this basename: HGBA1C   Lab Results  Component Value Date   ALT 29 04/11/2010   AST 32 04/11/2010   ALKPHOS 48 04/11/2010   BILITOT 1.0 04/11/2010   Lab Results  Component Value Date   TSH 1.82 04/11/2010

## 2011-02-01 NOTE — Patient Instructions (Signed)
Your physician recommends that you schedule a follow-up appointment in: 1 year with Dr. Wall  

## 2011-03-13 ENCOUNTER — Ambulatory Visit (INDEPENDENT_AMBULATORY_CARE_PROVIDER_SITE_OTHER): Payer: BC Managed Care – PPO | Admitting: *Deleted

## 2011-03-13 DIAGNOSIS — Z23 Encounter for immunization: Secondary | ICD-10-CM

## 2011-03-22 ENCOUNTER — Ambulatory Visit: Payer: BC Managed Care – PPO | Admitting: *Deleted

## 2011-03-22 DIAGNOSIS — Z23 Encounter for immunization: Secondary | ICD-10-CM

## 2011-03-25 ENCOUNTER — Other Ambulatory Visit: Payer: Self-pay | Admitting: Internal Medicine

## 2011-03-26 NOTE — Telephone Encounter (Signed)
Last OV 04/04/10. Last filled 12/24/10. Recommended to schedule 6 month f/u at last OV

## 2011-03-28 NOTE — Telephone Encounter (Signed)
Advise patient  Ok  #30 and 3 refills, also please arrange a OV

## 2011-05-04 ENCOUNTER — Other Ambulatory Visit: Payer: Self-pay | Admitting: Internal Medicine

## 2011-05-08 NOTE — Telephone Encounter (Signed)
Left message to call office

## 2011-05-08 NOTE — Telephone Encounter (Signed)
If he has a rash ok to call in: BETAMETHASONE DIPROPIONATE 0.05 % CREA twice a day as needed. #1 tube , no RF If rash is not improving or severe or he has other symptoms such as fever---->  he needs to be seen

## 2011-05-10 NOTE — Telephone Encounter (Signed)
Pt return call Left message to call office.  

## 2011-05-11 MED ORDER — BETAMETHASONE DIPROPIONATE 0.05 % EX CREA: TOPICAL_CREAM | Freq: Two times a day (BID) | CUTANEOUS | Status: DC | PRN

## 2011-05-11 NOTE — Telephone Encounter (Signed)
Discuss with patient, Rx sent. 

## 2011-06-13 ENCOUNTER — Ambulatory Visit: Payer: BC Managed Care – PPO | Admitting: Internal Medicine

## 2011-06-19 ENCOUNTER — Ambulatory Visit (INDEPENDENT_AMBULATORY_CARE_PROVIDER_SITE_OTHER): Payer: BC Managed Care – PPO | Admitting: Internal Medicine

## 2011-06-19 ENCOUNTER — Encounter: Payer: Self-pay | Admitting: Internal Medicine

## 2011-06-19 VITALS — BP 110/78 | HR 62 | Temp 98.3°F | Resp 16 | Wt 221.1 lb

## 2011-06-19 DIAGNOSIS — G250 Essential tremor: Secondary | ICD-10-CM

## 2011-06-19 DIAGNOSIS — L309 Dermatitis, unspecified: Secondary | ICD-10-CM | POA: Insufficient documentation

## 2011-06-19 DIAGNOSIS — R21 Rash and other nonspecific skin eruption: Secondary | ICD-10-CM

## 2011-06-19 NOTE — Progress Notes (Signed)
  Subjective:    Patient ID: Jeff [name], male    DOB: 21-Sep-1950, 61 y.o.   MRN: [id number]  HPI Here to discuss the following issues: Continue tremor in the right hand and right foot, mostly at rest, likes a second opinion in reference to the tremor. He was also prescribed a steroid cream for elbow redness, no improvement  As far as they cardiomyopathy, he saw cardiology, was stable, note reviewed.  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     Objective:   Physical Exam  Constitutional: He appears well-developed and well-nourished.  Cardiovascular:       Murmur noted. Regular rate and rhythm  Pulmonary/Chest: Effort normal and breath sounds normal. No respiratory distress. He has no wheezes. He has no rales.  Musculoskeletal: He exhibits no edema.  Skin:       Macular redness in both elbows without defined margins. Not scaly. Blanched with pressure. Skin at the eyelids and chest normal         Assessment & Plan:

## 2011-06-19 NOTE — Assessment & Plan Note (Signed)
Redness at  elbows does not seem to be psoriasis. We discussed referral to dermatology versus observation, he elected observation. Stop steroids

## 2011-06-19 NOTE — Assessment & Plan Note (Addendum)
Will refer to our neurologist for evaluation of   resting tremor

## 2011-06-19 NOTE — Patient Instructions (Signed)
Schedule a physical within 2 months

## 2011-06-20 ENCOUNTER — Encounter: Payer: Self-pay | Admitting: Neurology

## 2011-07-22 ENCOUNTER — Other Ambulatory Visit: Payer: Self-pay | Admitting: Cardiology

## 2011-07-27 ENCOUNTER — Encounter: Payer: Self-pay | Admitting: Neurology

## 2011-07-27 ENCOUNTER — Ambulatory Visit (INDEPENDENT_AMBULATORY_CARE_PROVIDER_SITE_OTHER): Payer: BC Managed Care – PPO | Admitting: Neurology

## 2011-07-27 VITALS — BP 96/60 | HR 64 | Ht 75.5 in | Wt 217.0 lb

## 2011-07-27 DIAGNOSIS — G20 Parkinson's disease: Secondary | ICD-10-CM

## 2011-07-27 MED ORDER — CARBIDOPA-LEVODOPA 25-100 MG PO TABS: 1.0000 | ORAL_TABLET | Freq: Three times a day (TID) | ORAL | Status: DC

## 2011-07-27 NOTE — Progress Notes (Signed)
Dear Dr. Drue Novel,  Thank you for having me see Jeff [name] in consultation today at [facility] for his problem with tremor.  As you may recall, he is a 61 y.o. year old male with a history of atrial fibrillation, HOCM, HLD who presents with gradual onset of worsening right hand and leg tremors.  He was initially seen at [facility] who diagnosed him with an essential tremor.  He was not started on any medication. The patient has not noticed any tremor on his left side. His wife first started noticing the tremor when he held a book. He's noticed a gradual worsening of his handwriting and that it has gotten "small". When he plays the guitar his leg tends to shake. He's not having any problems eating. He denies any changes in his gait or emotional expression. He denies any problems with dexterity.  He drinks alcohol rarely but has not noticed that it makes the tremor better.  His had not had any falls has not had any urinary incontinence.    Past Medical History  Diagnosis Date  . Hyperlipidemia   . Hypertrophic obstructive cardiomyopathy   . Atrial fibrillation 09/2008    hx of  . Atrial flutter 09/2008    hx of  . Psoriasis   . Allergic rhinitis   . Hx of colonoscopy     Past Surgical History  Procedure Date  . Tonsillectomy     History   Social History  . Marital Status: Married    Spouse Name: N/A    Number of Children: 0  . Years of Education: N/A   Occupational History  . [employer] and [employer]    Social History Main Topics  . Smoking status: Former Smoker    Quit date: 07/27/1970  . Smokeless tobacco: Never Used  . Alcohol Use: Yes     rare  . Drug Use: None  . Sexually Active: None   Other Topics Concern  . None   Social History Narrative  . None    Family History  Problem Relation Age of Onset  . Coronary artery disease Neg Hx   . Heart murmur Brother   . Hypertension Neg Hx   . Diabetes Neg Hx   . Stroke Neg Hx   . Colon cancer Neg Hx     . Prostate cancer Neg Hx   . Alzheimer's disease Father   - no history of Parkinson's - niece and nephew have "tremor".  Current Outpatient Prescriptions on File Prior to Visit  Medication Sig Dispense Refill  . amoxicillin (AMOXIL) 500 MG capsule 4 tabs pre dental work       . aspirin EC 325 MG tablet Take 325 mg by mouth daily.        . disopyramide (NORPACE) 150 MG capsule Take 1 capsule (150 mg total) by mouth 2 (two) times daily.  180 capsule  3  . metoprolol succinate (TOPROL-XL) 25 MG 24 hr tablet TAKE 1 TABLET BY MOUTH DAILY  90 tablet  1  . selenium sulfide (SELSUN) 2.5 % shampoo USE AS DIRECTED  120 mL  5  . simvastatin (ZOCOR) 40 MG tablet TAKE 1 TABLET BY MOUTH EVERY NIGHT AT BEDTIME  30 tablet  3    No Known Allergies    ROS:  13 systems were reviewed and are notable for burning pain in his right foot, no back pain..  All other review of systems are unremarkable.   Examination:  Filed Vitals:  07/27/11 1018  BP: 96/60  Pulse: 64  Height: 6' 3.5" (1.918 m)  Weight: 217 lb (98.431 kg)     In general, well appearing man.  Cardiovascular: The patient has a regular rate and rhythm and no carotid bruits.  Fundoscopy:  Disks are flat. Vessel caliber within normal limits.  Mental status:   The patient is oriented to person, place and time. Recent and remote memory are intact. Attention span and concentration are normal. Language including repetition, naming, following commands are intact. Fund of knowledge of current and historical events, as well as vocabulary are normal.  Cranial Nerves: Pupils are equally round and reactive to light. Visual fields full to confrontation. Extraocular movements are intact without nystagmus. Facial sensation and muscles of mastication are intact. Muscles of facial expression are symmetric. Obvious left ptosis.  Hearing intact to bilateral finger rub. Tongue protrusion, uvula, palate midline.  Shoulder shrug intact  Motor:  The  patient has mildly increased tone in his right arm. Clear resting and postural tremor on right, with mild postural tremor in right arm.  Resting tremor in right leg. Mild decreased amplitude of movements on right.  5/5 muscle strength bilaterally.  Reflexes:   Biceps  Triceps Brachioradialis Knee Ankle  Right 2+  2+  2+   2+ 1+  Left  2+  2+  2+   2+ 1+  Toes down  Coordination:  Normal finger to nose.  No dysdiadokinesia.  Sensation is may be decreased to temperature and vibration in both feet, perhaps more on right in length dep manner.  Gait reveals reduced arm swing on right, with exacerbation of tremor.  Romberg is negative   Impression/Recs:   1.  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  I am going to start him on Sinemet and titrate him to 100/25 tid.  We will continue to increase until we reach at least 1000mg  of Sinemet per day or he can't tolerate the dose. 2  Burning in right foot - possible radiculopathy/ neuropathy.  I will consider EMG/NCS at later date.  We will see the patient back in 6 weeks.  Thank you for having [place] see Jeff [name] in consultation.  Feel free to contact me with any questions.  Lupita Raider Modesto Charon, MD [facility], [place] [address] [place], [place] [address] Phone: [phone] Fax: [phone].

## 2011-07-27 NOTE — Patient Instructions (Signed)
Sinemet 100/25 tabs  take 1/2 tab for 5 days at dinner, then 1/2 tab twice a day for 5 days, then 1/2 tab three times a day for 5 days, then 1/2 tab in the am., 1/2 tab at noon, 1 tab at dinner for 5 days then 1 tab in the am. 1/2 tab at noon, 1 tab at dinner for 5 days, then 1 tab three times a day from then on.

## 2011-07-29 ENCOUNTER — Other Ambulatory Visit: Payer: Self-pay | Admitting: Internal Medicine

## 2011-07-30 NOTE — Telephone Encounter (Signed)
Refill done.  

## 2011-08-23 ENCOUNTER — Ambulatory Visit (INDEPENDENT_AMBULATORY_CARE_PROVIDER_SITE_OTHER): Payer: BC Managed Care – PPO | Admitting: Internal Medicine

## 2011-08-23 DIAGNOSIS — Z0000 Encounter for general adult medical examination without abnormal findings: Secondary | ICD-10-CM

## 2011-08-23 DIAGNOSIS — D696 Thrombocytopenia, unspecified: Secondary | ICD-10-CM

## 2011-08-23 LAB — CBC WITH DIFFERENTIAL/PLATELET
Basophils Absolute: 0 10*3/uL (ref 0.0–0.1)
Eosinophils Absolute: 0.3 10*3/uL (ref 0.0–0.7)
HCT: 43.6 % (ref 39.0–52.0)
Hemoglobin: 14.7 g/dL (ref 13.0–17.0)
Lymphs Abs: 1.5 10*3/uL (ref 0.7–4.0)
MCHC: 33.8 g/dL (ref 30.0–36.0)
Neutro Abs: 2.7 10*3/uL (ref 1.4–7.7)
RDW: 13.6 % (ref 11.5–14.6)

## 2011-08-23 LAB — COMPREHENSIVE METABOLIC PANEL
ALT: 10 U/L (ref 0–53)
AST: 31 U/L (ref 0–37)
Calcium: 9.5 mg/dL (ref 8.4–10.5)
Chloride: 104 mEq/L (ref 96–112)
Creatinine, Ser: 1.1 mg/dL (ref 0.4–1.5)
Sodium: 139 mEq/L (ref 135–145)
Total Bilirubin: 0.6 mg/dL (ref 0.3–1.2)

## 2011-08-23 LAB — TSH: TSH: 1.37 u[IU]/mL (ref 0.35–5.50)

## 2011-08-23 LAB — PSA: PSA: 1.39 ng/mL (ref 0.10–4.00)

## 2011-08-23 NOTE — Assessment & Plan Note (Signed)
Td ~ 2009  (patient no sure) Shingles shot discussed  Colonoscopy:   11-2006, adenomatous polyp, next 11-2011, pt aware , to call GI if no recalled diet, exercise discussed Labs Also concderned about some issues ref his wife-- counseled

## 2011-08-23 NOTE — Patient Instructions (Signed)
Check the  blood pressure 2 or 3 times a week, be sure it is less than 135/85. If it is consistently higher, let me know You are due for a colonoscopy 11-2011

## 2011-08-23 NOTE — Progress Notes (Signed)
  Subjective:    Patient ID: Jeff [name], male    DOB: 31-Jul-1950, 61 y.o.   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  Respiratory: Negative for cough and shortness of breath.   Cardiovascular: Negative for chest pain and leg swelling.  Gastrointestinal: Negative for abdominal pain and blood in stool.  Genitourinary: Negative for dysuria and hematuria.  Psychiatric/Behavioral:       No depression or anxiety        Objective:   Physical Exam  General:  alert, well-developed, and well-nourished.   Neck, no thyromegaly, normal carotid pulses Lungs:  Normal respiratory effort, chest expands symmetrically. Lungs are clear to auscultation, no crackles or wheezes. Heart:  Soft systolic murmur noted Abdomen:  soft, non-tender, no distention, no masses, no guarding, and no rigidity.   Rectal:  No external abnormalities noted. Normal sphincter tone. No rectal masses or tenderness. Brown stools, Hemoccult negative Prostate:  Prostate gland firm and smooth, no enlargement, nodularity, tenderness, mass, asymmetry or induration. Msk:  inspection and palpation of the  upper extremities muscles normal Extremities:  No  lower  extremity edema Psych:  not anxious appearing and not depressed appearing.       Assessment & Plan:

## 2011-08-25 ENCOUNTER — Encounter: Payer: Self-pay | Admitting: Internal Medicine

## 2011-08-27 ENCOUNTER — Encounter: Payer: Self-pay | Admitting: *Deleted

## 2011-08-27 NOTE — Progress Notes (Signed)
Addended by: Edwena Felty T on: 08/27/2011 04:47 PM   Modules accepted: Orders

## 2011-08-30 ENCOUNTER — Telehealth: Payer: Self-pay | Admitting: Hematology & Oncology

## 2011-08-30 NOTE — Telephone Encounter (Signed)
Left pt message to call and schedule appointment °

## 2011-09-03 ENCOUNTER — Telehealth: Payer: Self-pay | Admitting: Hematology & Oncology

## 2011-09-03 ENCOUNTER — Encounter: Payer: Self-pay | Admitting: Internal Medicine

## 2011-09-03 NOTE — Telephone Encounter (Signed)
Talked to patient he said he would call back to schedule appointment

## 2011-09-03 NOTE — Telephone Encounter (Signed)
Pt called made 10-01-11 appointment

## 2011-09-11 ENCOUNTER — Encounter: Payer: Self-pay | Admitting: Neurology

## 2011-09-11 ENCOUNTER — Ambulatory Visit (INDEPENDENT_AMBULATORY_CARE_PROVIDER_SITE_OTHER): Payer: BC Managed Care – PPO | Admitting: Neurology

## 2011-09-11 DIAGNOSIS — G20 Parkinson's disease: Secondary | ICD-10-CM

## 2011-09-11 NOTE — Progress Notes (Signed)
Dear Dr. 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  As you may recall, he is a 61 y.o. year old male with a history of atrial fibrillation and HOCM who I felt had tremor predominant parkinson's affecting his right arm and leg at his last visit.  I started him on a titration of Sinemet to 100/25 tid.  He has had some "gas" but no stomach upset or dizziness from the Sinemet.  He does not feel it helps him.    He has had some mild imbalance, as well as what sounds like difficulty completely emptying his bladder.  Although this is unclear if these started with the Sinemet dosing.  The tremor only bothers him mildly socially and does not cause significant dysfunction.  Medical history, social history, and family history were reviewed and have not changed since the last clinic visit.  Current Outpatient Prescriptions on File Prior to Visit  Medication Sig Dispense Refill  . amoxicillin (AMOXIL) 500 MG capsule 4 tabs pre dental work       . aspirin EC 325 MG tablet Take 325 mg by mouth daily.        . carbidopa-levodopa (SINEMET) 25-100 MG per tablet Take 1 tablet by mouth 3 (three) times daily.  90 tablet  3  . disopyramide (NORPACE) 150 MG capsule Take 1 capsule (150 mg total) by mouth 2 (two) times daily.  180 capsule  3  . metoprolol succinate (TOPROL-XL) 25 MG 24 hr tablet TAKE 1 TABLET BY MOUTH DAILY  90 tablet  1  . selenium sulfide (SELSUN) 2.5 % shampoo USE AS DIRECTED  120 mL  5  . simvastatin (ZOCOR) 40 MG tablet TAKE ONE TABLET BY MOUTH EVERY NIGHT AT BEDTIME  30 tablet  6    No Known Allergies  ROS:  13 systems were reviewed and are notable for mild imbalance as above when he turns.  All other review of systems are unremarkable.  Exam: . Filed Vitals:   09/11/11 1155  BP: 108/78  Pulse: 62  Weight: 219 lb (99.338 kg)    In general, mildly masked facies.  Mental status:   The patient is  oriented to person, place and time. Recent and remote memory are intact. Attention span and concentration are normal. Language including repetition, naming, following commands are intact. Fund of knowledge of current and historical events, as well as vocabulary are normal.  Cranial Nerves: Saccades normal.  Motor:  Mild cogwheeling right arm, with rest and postural tremor that is coarse.  Also leg involved on right at rest.  Reflexes:  Symmetric, 2+ except absent ankles.  Coordination:  Normal finger to nose  Gait:  Normal gait and station turns well.  No retropulsion.  + tremor accentuated on right with walking.  Impression/Recommendations: 1.  Tremor predominant Parkinson's disease - patient would like to try being off medication for now as he has very little dysfunction.  If he notices worsening then he will call me and we may consider higher doses of the Sinemet.  Amantadine and zonisamide are other possible choices.    Lupita Raider Modesto Charon, MD [facility], 

## 2011-09-13 ENCOUNTER — Encounter: Payer: Self-pay | Admitting: Gastroenterology

## 2011-09-26 ENCOUNTER — Ambulatory Visit: Payer: BC Managed Care – PPO | Admitting: Hematology & Oncology

## 2011-09-26 ENCOUNTER — Ambulatory Visit: Payer: BC Managed Care – PPO

## 2011-09-26 ENCOUNTER — Other Ambulatory Visit: Payer: BC Managed Care – PPO | Admitting: Lab

## 2011-09-28 ENCOUNTER — Encounter: Payer: Self-pay | Admitting: Gastroenterology

## 2011-10-01 ENCOUNTER — Ambulatory Visit (HOSPITAL_BASED_OUTPATIENT_CLINIC_OR_DEPARTMENT_OTHER): Payer: BC Managed Care – PPO

## 2011-10-01 ENCOUNTER — Telehealth: Payer: Self-pay | Admitting: Neurology

## 2011-10-01 ENCOUNTER — Ambulatory Visit (HOSPITAL_BASED_OUTPATIENT_CLINIC_OR_DEPARTMENT_OTHER): Payer: BC Managed Care – PPO | Admitting: Hematology & Oncology

## 2011-10-01 ENCOUNTER — Other Ambulatory Visit (HOSPITAL_BASED_OUTPATIENT_CLINIC_OR_DEPARTMENT_OTHER): Payer: BC Managed Care – PPO | Admitting: Lab

## 2011-10-01 VITALS — BP 132/79 | HR 64 | Temp 98.3°F | Ht 74.0 in | Wt 222.0 lb

## 2011-10-01 DIAGNOSIS — D696 Thrombocytopenia, unspecified: Secondary | ICD-10-CM

## 2011-10-01 LAB — CBC WITH DIFFERENTIAL (CANCER CENTER ONLY)
BASO%: 0.2 % (ref 0.0–2.0)
Eosinophils Absolute: 0.3 10*3/uL (ref 0.0–0.5)
LYMPH#: 1.8 10*3/uL (ref 0.9–3.3)
MCV: 88 fL (ref 82–98)
MONO#: 1 10*3/uL — ABNORMAL HIGH (ref 0.1–0.9)
Platelets: 123 10*3/uL — ABNORMAL LOW (ref 145–400)
RBC: 4.89 10*6/uL (ref 4.20–5.70)
RDW: 12.9 % (ref 11.1–15.7)
WBC: 8.6 10*3/uL (ref 4.0–10.0)

## 2011-10-01 LAB — TECHNOLOGIST REVIEW CHCC SATELLITE

## 2011-10-01 LAB — CHCC SATELLITE - SMEAR

## 2011-10-01 NOTE — Telephone Encounter (Signed)
Called and spoke with the patient. He states he and Dr. Modesto Charon decided at his last appointment (April 16th) that he would stop the Sinemet 25/100 he was taking for his tremor as he didn't think it was helping all that much. He is calling today in hopes of restarting the Sinemet as he feels his tremor may be a little worse. He goes on to say that he doesn't want to do like he did last time (weaning up slowly) but just wants to start taking one TID. He states it was annoying to have to break these pills. I explained that the slow titration was to hopefully avoid unpleasant side effects. The patient just wants to take one pill a day for a few days then two pills a day for a few days then three pills a day thereafter. I told him I would check with Dr. Modesto Charon and we would be in touch with any additional information. The patient was ok with this plan. He reports that he already has plenty of medication on hand. **Dr. Modesto Charon, please advise.

## 2011-10-01 NOTE — Telephone Encounter (Signed)
Pt is interested in restarting the "meds" that Dr. Modesto Charon told him to quit and he needs instructions how to restart them. He never said which medication he needed to restart, or why he needed to restart it.

## 2011-10-01 NOTE — Telephone Encounter (Signed)
He can do as he plans qd, then bid, then tid.

## 2011-10-01 NOTE — Progress Notes (Signed)
This office note has been dictated.

## 2011-10-02 ENCOUNTER — Telehealth: Payer: Self-pay | Admitting: Hematology & Oncology

## 2011-10-02 NOTE — Progress Notes (Signed)
CC:   Jeff Ora, MD Jeff Meek, MD Jeff [name]. 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  He is followed by Dr. Willow [name].  There is some question of him having early Parkinson's.  He saw Dr. Modesto Charon of Neurology.  Dr. Modesto Charon went ahead and put him on Sinemet.  From what Dr. Nash Dimmer note says, he has tremor predominant Parkinson's.  This was started I think back in early March.  Dr. Drue Novel noted that Dr. Feliz [name] has had thrombocytopenia going back to 2011.  In November 2011, his platelet count was 121.  He was not anemic or leukocytopenic.  Back in May 2010, his platelet count was 142.  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  He has never had surgery.  He does have, I guess paroxysmal atrial flutter and is on Norpace for this.  He also takes aspirin.  Dr. Drue Novel felt that a hematologic evaluation was indicated.  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  He has had no epistaxis.  There is no gingival bleeding.  He has had no hemoptysis. There have been no change in bowel or bladder habits.  He has not had any rashes.  There have been no joint issues.  PAST MEDICAL HISTORY: 1. Remarkable for the atrial flutter/fibrillation. 2. Hypertrophic obstructive cardiomyopathy. 3. Psoriasis. 4. Parkinson's. 5. Hyperlipidemia.  ALLERGIES:  None.  MEDICATIONS:  Aspirin 325 mg p.o. daily, Norpace 150 mg p.o. daily, metoprolol-XL 25 mg p.o. daily, Zocor 40 mg p.o. daily and Sinemet (25/100) one p.o. t.i.d.  SOCIAL HISTORY:  Negative for tobacco use.  He has rare alcohol use. 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  Also does some teaching over at [employer].  FAMILY HISTORY:  Noncontributory.  REVIEW OF SYSTEMS:  As stated in the history of present illness.  No additional  findings are noted on a 12 system review.  PHYSICAL EXAM:  This is a well-developed, well-nourished white gentleman in no obvious distress.  Vital signs:  Temperature 98.3, pulse 64, respiratory rate 20, blood pressure 132/79, weight is 222.  Head/neck: A normocephalic, atraumatic skull.  There are no ocular or oral lesions. There is no palatal petechia.  There is no adenopathy in the neck. Thyroid is not palpable.  Lungs:  Clear bilaterally.  There are no rales, wheezes or rhonchi.  Cardiac:  Regular rate and rhythm with a normal S1, S2.  He has a 2/6 systolic ejection murmur consistent with his cardiomyopathy.  Abdomen:  Soft with good bowel sounds.  There is no palpable abdominal mass.  There is no fluid wave.  There is no palpable hepatosplenomegaly.  Back:  No tenderness over the spine, ribs, or hips. Extremities:  No clubbing, cyanosis or edema.  Skin:  No rashes, ecchymosis or petechia.  Neurological:  No focal neurological deficits.  LABORATORY STUDIES:  White cell count 8.6, hemoglobin 15, hematocrit 43, platelet count 123.  MCV is 88.  Peripheral blood smear shows a normochromic, normocytic population of red blood cells.  There is no nucleated red blood cells.  I see no schistocytes.  There are no teardrop cells.  There is no rouleaux formation.  I see no target cells.  White cells appear normal in morphology and maturation.  There are no immature, myeloid  or lymphoid forms.  There is no hypersegmented polys.  I see no atypical lymphocytes.  Platelets are minimally decreased in number.  He has several large platelets.  He has a few giant platelets.  Platelets are well granulated.  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  I must say, I had a great time talking to him.  He is a big baseball fan.  He comes in wearing a [employer]. As such, we talked baseball quite a bit.  By his blood smear, one would have to suspect that  this thrombocytopenia is a consumptive or destructive type process.  One might think this is an immune type thrombocytopenia.  I thought that there might be a role with this hypertrophic obstructive cardiomyopathy.  However, I would have thought that I would have also seen some schistocytes that would be causing red cells to be destroyed. However, I suppose that the thrombocytopenia might be related to his HOCM.  This thrombocytopenia dated back to 2011.  As such, it would not be from the Sinemet.  I suppose that it might be the statin drug that he is on, but I would not stop this.  I see absolutely no indication for an underlying hematologic malignancy.  I do not think that we need to do a bone marrow test on him.  I really believe that we can just watch him and follow him up in about 4 months.  Again, he has had this for over 2 years.  He is asymptomatic. The fact that his platelets are large indicate that they are young platelets and young platelets are very functional.  I spent an hour or so with he and his wife.  Again, it was nice to talk to him about baseball.  It was also nice talking about [employer] where my daughter just graduated from this past weekend.    ______________________________ Josph Macho, M.D. PRE/MEDQ  D:  10/01/2011  T:  10/02/2011  Job:  [id number]

## 2011-10-02 NOTE — Telephone Encounter (Signed)
Mailed 9-6 schedule

## 2011-10-02 NOTE — Telephone Encounter (Signed)
Called and spoke with the patient. Will restart the Sinemet 25/100 once a day x 5 days the bid x 5 days then tid thereafter. Asked that he call if any problems. He states he will.

## 2011-10-05 ENCOUNTER — Encounter: Payer: Self-pay | Admitting: Gastroenterology

## 2011-10-05 ENCOUNTER — Ambulatory Visit (AMBULATORY_SURGERY_CENTER): Payer: BC Managed Care – PPO

## 2011-10-05 VITALS — Ht 77.0 in | Wt 219.3 lb

## 2011-10-05 DIAGNOSIS — Z86010 Personal history of colonic polyps: Secondary | ICD-10-CM

## 2011-10-05 DIAGNOSIS — Z1211 Encounter for screening for malignant neoplasm of colon: Secondary | ICD-10-CM

## 2011-10-05 MED ORDER — PEG-KCL-NACL-NASULF-NA ASC-C 100 G PO SOLR: 1.0000 | Freq: Once | ORAL | Status: AC

## 2011-10-17 ENCOUNTER — Telehealth: Payer: Self-pay | Admitting: Gastroenterology

## 2011-10-17 NOTE — Telephone Encounter (Signed)
Charge. 

## 2011-10-19 ENCOUNTER — Other Ambulatory Visit: Payer: BC Managed Care – PPO | Admitting: Gastroenterology

## 2011-10-30 ENCOUNTER — Telehealth: Payer: Self-pay | Admitting: Neurology

## 2011-10-30 NOTE — Telephone Encounter (Signed)
Pt LM asking if he could increase his Sinemet from 3x a day to 4x a day? He did not say why he wanted to increase the dose. He also would like to know how to increase the meds if he can. Please call home number and advise.

## 2011-10-30 NOTE — Telephone Encounter (Signed)
Patient returned my call. Currently taking Sinemet 25/100 mg TID. He would like to increase the medication if Dr. Modesto Charon is in agreement. He states that this was discussed at this office visit and that he could potentially increase to 5 pills a day. He is still having symptoms and would like to try a higher dose. He also wants to know how to increase the medication and what time of the day to take it. I told him that Dr. Modesto Charon was out of the office until next Monday. The  patient is in no hurry to do this. He will just wait to hear back from [place] with the instructions. **Dr. Modesto Charon please advise. Thank you.

## 2011-10-30 NOTE — Telephone Encounter (Signed)
I left a message for the patient to call me.

## 2011-11-01 NOTE — Telephone Encounter (Signed)
I would increase to 2 pills of the 100/25 tid.  Have him add one pill to the a.m. dose for 5 days(2/1/1) then one pill to the afternoon dose for 5 days (2/2/1) and then finally one pill to the evening dose (2/2/2).

## 2011-11-05 ENCOUNTER — Other Ambulatory Visit: Payer: Self-pay | Admitting: Neurology

## 2011-11-05 MED ORDER — CARBIDOPA-LEVODOPA 25-100 MG PO TABS: 2.0000 | ORAL_TABLET | Freq: Three times a day (TID) | ORAL | Status: DC

## 2011-11-05 NOTE — Telephone Encounter (Signed)
Called and spoke with the patient. Information provided as per Dr. Modesto Charon below. Pt understands to titrate up one pill q 5 days of the Sinemet 25/100. Will call in a refill for him at the [facility] on [address]. No other issues at this time.

## 2011-12-30 ENCOUNTER — Other Ambulatory Visit: Payer: Self-pay | Admitting: Cardiology

## 2012-01-29 ENCOUNTER — Other Ambulatory Visit: Payer: Self-pay | Admitting: Cardiology

## 2012-02-01 ENCOUNTER — Ambulatory Visit (HOSPITAL_BASED_OUTPATIENT_CLINIC_OR_DEPARTMENT_OTHER): Payer: BC Managed Care – PPO | Admitting: Hematology & Oncology

## 2012-02-01 ENCOUNTER — Other Ambulatory Visit (HOSPITAL_BASED_OUTPATIENT_CLINIC_OR_DEPARTMENT_OTHER): Payer: BC Managed Care – PPO | Admitting: Lab

## 2012-02-01 VITALS — BP 116/72 | HR 62 | Temp 97.9°F | Resp 20 | Ht 74.0 in | Wt 217.0 lb

## 2012-02-01 DIAGNOSIS — D696 Thrombocytopenia, unspecified: Secondary | ICD-10-CM

## 2012-02-01 LAB — CBC WITH DIFFERENTIAL (CANCER CENTER ONLY)
BASO%: 0.4 % (ref 0.0–2.0)
Eosinophils Absolute: 0.2 10*3/uL (ref 0.0–0.5)
LYMPH#: 1.5 10*3/uL (ref 0.9–3.3)
LYMPH%: 29.4 % (ref 14.0–48.0)
MCV: 87 fL (ref 82–98)
MONO#: 0.5 10*3/uL (ref 0.1–0.9)
NEUT#: 3 10*3/uL (ref 1.5–6.5)
Platelets: 135 10*3/uL — ABNORMAL LOW (ref 145–400)
RBC: 4.9 10*6/uL (ref 4.20–5.70)
RDW: 12.8 % (ref 11.1–15.7)
WBC: 5.1 10*3/uL (ref 4.0–10.0)

## 2012-02-01 LAB — CHCC SATELLITE - SMEAR

## 2012-02-01 NOTE — Progress Notes (Signed)
This office note has been dictated.

## 2012-02-02 NOTE — Progress Notes (Signed)
CC:   [name], MD [name], MD 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  CURRENT THERAPY:  Observation.  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  He is doing fairly well.  We initially saw him back in May.  At that point in time, all of those studies really came back normal with respect to his thrombocytopenia.  As such, I felt that there was likely an immune component, based on his blood smear.  He has had no problem with bleeding or bruising.  He has had a good summer.  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  PHYSICAL EXAMINATION:  This is a well-developed, well-nourished white gentleman in no obvious distress.  Vital signs SHOW A temperature of 97.9, pulse 62, respiratory rate 20, blood pressure 116/72.  Weight is 217.  Head and neck:  Normocephalic, atraumatic skull.  There are no ocular or oral lesions.  There are no palpable cervical or supraclavicular lymph nodes.  Lungs:  Clear bilaterally.  Cardiac: Regular rate and rhythm with a normal S1 and S2.  I really do not detect any obvious atrial fibrillation issues.  He has a 2/6 systolic ejection murmur which is chronic.  Abdomen:  Soft with good bowel sounds.  There is no palpable abdominal mass.  There is no fluid wave.  There is no palpable hepatosplenomegaly.  Back:  No tenderness over the spine, ribs, or hips.  Extremities:  No clubbing, cyanosis or edema.  Skin:  No rashes, ecchymoses or petechia.  LABORATORY STUDIES:  White cell count is 5.1, hemoglobin 14.8, hematocrit 42.6, platelet count 135.  Peripheral smear shows a normochromic, normocytic population of red blood cells.  There were no nucleated red blood cells.  There was no rouleaux formation.  There were no schistocytes or spherocytes.  White cells are with no hypersegmented polys.  There are no immature myeloid cells.  Platelets are minimally decreased in number.  Platelets are  well- granulated.  He has several large platelets.  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  Again, I have to suspect that this is immune- mediated.  I have not seen anything with respect to his blood smear or with his labs that would suggest any other issue.  I suppose that this might be medication-related but, again, I would not have him change medications.  I think we need him back in 6 months' time.  I think if his platelets are stable in 6 months, we can let him go from the office.  As always, it is fun talking to Dr. [name] about astronomy.  He is an incredibly interesting guy with a wealth of knowledge about the stars.    ______________________________ Josph Macho, M.D. PRE/MEDQ  D:  02/01/2012  T:  02/02/2012  Job:  [id number]

## 2012-02-25 ENCOUNTER — Ambulatory Visit (INDEPENDENT_AMBULATORY_CARE_PROVIDER_SITE_OTHER): Payer: BC Managed Care – PPO | Admitting: Internal Medicine

## 2012-02-25 ENCOUNTER — Encounter: Payer: Self-pay | Admitting: Internal Medicine

## 2012-02-25 VITALS — BP 112/72 | HR 59 | Temp 98.2°F | Wt 220.0 lb

## 2012-02-25 DIAGNOSIS — G20 Parkinson's disease: Secondary | ICD-10-CM

## 2012-02-25 DIAGNOSIS — D696 Thrombocytopenia, unspecified: Secondary | ICD-10-CM | POA: Insufficient documentation

## 2012-02-25 DIAGNOSIS — L309 Dermatitis, unspecified: Secondary | ICD-10-CM

## 2012-02-25 DIAGNOSIS — Z23 Encounter for immunization: Secondary | ICD-10-CM

## 2012-02-25 DIAGNOSIS — G20A1 Parkinson's disease without dyskinesia, without mention of fluctuations: Secondary | ICD-10-CM

## 2012-02-25 DIAGNOSIS — Z0000 Encounter for general adult medical examination without abnormal findings: Secondary | ICD-10-CM

## 2012-02-25 DIAGNOSIS — E785 Hyperlipidemia, unspecified: Secondary | ICD-10-CM

## 2012-02-25 DIAGNOSIS — L259 Unspecified contact dermatitis, unspecified cause: Secondary | ICD-10-CM

## 2012-02-25 MED ORDER — CLOBETASOL PROPIONATE 0.05 % EX CREA: TOPICAL_CREAM | Freq: Two times a day (BID) | CUTANEOUS | Status: DC | PRN

## 2012-02-25 NOTE — Assessment & Plan Note (Signed)
We reviewed his labs, cholesterol well-controlled, no change

## 2012-02-25 NOTE — Assessment & Plan Note (Signed)
Tremor, Parkinson's? Taking medication as recommended, the patient noticed no major improvement.

## 2012-02-25 NOTE — Assessment & Plan Note (Addendum)
Scaly rash  betwen the eyebrows and behind the ear likely eczema, previous creams (diprolene and west cort) didn't help much, trial w/ temovate

## 2012-02-25 NOTE — Assessment & Plan Note (Signed)
Chart review, saw hematology, thrombocytopenia believed to immuno mediated, last CBC  stable.

## 2012-02-25 NOTE — Progress Notes (Signed)
  Subjective:    Patient ID: Jeff [name], male    DOB: 04/18/1951, 61 y.o.   MRN: [id number]  HPI Routine office visit Thrombocytopenia, chart reviewed, saw hematology, stable. Tremors,? Parkinson disease, neurology note reviewed, currently on Sinemet, has not seen any major improvement. Hyperlipidemia, good medication compliance, no apparent side effects Complains of a rash between the eyebrows and behind the ears, sx on and off. Previously  , steroid creams did not work.  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     Objective:   Physical Exam  General: alert, well-developed, and well-nourished.  Lungs: Normal respiratory effort, chest expands symmetrically. Lungs are clear to auscultation, no crackles or wheezes.  Heart: Soft systolic murmur noted  Extremities: No lower extremity edema  Psych: not anxious appearing and not depressed appearing.     Assessment & Plan:

## 2012-02-25 NOTE — Assessment & Plan Note (Addendum)
Flu shot provided, patient wonders about the pneumonia shot, I recommend to have it at the 64. Was unable to do a colonoscopy this year but plans to do it next year.

## 2012-03-02 ENCOUNTER — Other Ambulatory Visit: Payer: Self-pay | Admitting: Internal Medicine

## 2012-03-03 NOTE — Telephone Encounter (Signed)
Refill done.  

## 2012-03-27 ENCOUNTER — Other Ambulatory Visit: Payer: Self-pay | Admitting: Neurology

## 2012-03-27 MED ORDER — CARBIDOPA-LEVODOPA 25-100 MG PO TABS: 2.0000 | ORAL_TABLET | Freq: Three times a day (TID) | ORAL | Status: DC

## 2012-04-04 ENCOUNTER — Ambulatory Visit (INDEPENDENT_AMBULATORY_CARE_PROVIDER_SITE_OTHER): Payer: BC Managed Care – PPO | Admitting: Neurology

## 2012-04-04 ENCOUNTER — Encounter: Payer: Self-pay | Admitting: Neurology

## 2012-04-04 VITALS — BP 108/72 | HR 64 | Temp 97.6°F | Resp 16 | Wt 218.0 lb

## 2012-04-04 DIAGNOSIS — G20 Parkinson's disease: Secondary | ICD-10-CM

## 2012-04-04 DIAGNOSIS — E538 Deficiency of other specified B group vitamins: Secondary | ICD-10-CM

## 2012-04-04 DIAGNOSIS — G20A1 Parkinson's disease without dyskinesia, without mention of fluctuations: Secondary | ICD-10-CM

## 2012-04-04 LAB — BASIC METABOLIC PANEL
Calcium: 9 mg/dL (ref 8.4–10.5)
GFR: 73.61 mL/min (ref >60.00)
Glucose, Bld: 78 mg/dL (ref 70–99)
Potassium: 4 mEq/L (ref 3.5–5.1)
Sodium: 141 mEq/L (ref 135–145)

## 2012-04-04 LAB — VITAMIN B12: Vitamin B-12: 276 pg/mL (ref 211–911)

## 2012-04-04 MED ORDER — PRAMIPEXOLE DIHYDROCHLORIDE 0.5 MG PO TABS: 0.5000 mg | ORAL_TABLET | Freq: Three times a day (TID) | ORAL | Status: DC

## 2012-04-04 MED ORDER — PRAMIPEXOLE DIHYDROCHLORIDE 0.125 MG PO TABS: ORAL_TABLET | ORAL | Status: DC

## 2012-04-04 MED ORDER — CARBIDOPA-LEVODOPA 25-100 MG PO TABS: 2.0000 | ORAL_TABLET | Freq: Three times a day (TID) | ORAL | Status: DC

## 2012-04-04 NOTE — Progress Notes (Signed)
Jeff [name] was seen today in the movement disorders clinic for neurologic f/u at the request of Dr. Modesto Charon.  The consultation is for the evaluation of PD.  Since Dr. Modesto Charon is no longer with the practice, I am now seeing the patient.  I reviewed Dr. Nash Dimmer notes.  This patient is accompanied in the office by his spouse who supplements the history.  The patient was last seen by Dr. Modesto Charon on 09/01/2011.  At that time, Dr. Modesto Charon discontinued the patient's carbidopa levodopa as the patient did not think that it was helpful.  The patient called here not long thereafter (because of worsening of tremor in R hand and R foot) and restarted the medication, and then eventually called in June and the medication was increased.  He is currently taking carbidopa/levodopa 25/100, two tablets 3 times a day.  He did note some improvement in tremor but he thinks that he would like to try 3 po tid.  He states with the increase in sinemet the lightheadedness did not get worse.  They have not noted dyskinesia.  The patient reports that first symptoms was tremor.  He noted that when he would hold the hymnal at church, the R hand would shake.  He had no tremor on the L.  His wife reports that tremor began intermittently 10 years ago but PD was dx by Dr. Modesto Charon on 07/27/11.  The patient stated that he quit using the laser pointer in the R hand over the last year b/c of tremor.  His wife would note that tremor would occur at rest.  Tremor does not interfere with guitar playing and he does not notice if it he is using the Polyakov.  He has noted that his R foot feels odd and stiff.  It is not painful.    Specific Symptoms:  Tremor: yes Voice: no changes Sleep: sleeps well except nocturia x 2  Vivid Dreams:  no  Acting out dreams:  no Wet Pillows: yes Postural symptoms:  yes...bumps into wall little more used to  Falls?  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 amoxicillin (AMOXIL) 500 MG capsule 4 tabs pre dental work       . aspirin EC 325 MG tablet Take 325 mg by mouth daily.        . clobetasol cream (TEMOVATE) 0.05 % Apply topically 2 (two) times daily as needed.  30 g  1  . disopyramide (NORPACE) 150 MG capsule TAKE 1 CAPSULE BY MOUTH TWICE DAILY  180 capsule  1  . metoprolol succinate (TOPROL-XL) 25 MG 24 hr tablet TAKE 1 TABLET BY MOUTH DAILY  90 tablet  1  . selenium sulfide (SELSUN) 2.5 % shampoo USE AS DIRECTED  120 mL  5  . simvastatin (ZOCOR) 40 MG tablet TAKE ONE TABLET BY MOUTH EVERY NIGHT AT BEDTIME  30 tablet  6  . [DISCONTINUED] carbidopa-levodopa (SINEMET IR) 25-100 MG per tablet Take  2 tablets by mouth 3 (three) times daily.  180 tablet  0  . pramipexole (MIRAPEX) 0.5 MG tablet Take 1 tablet (0.5 mg total) by mouth 3 (three) times daily.  90 tablet  2    PAST MEDICAL HISTORY:   Past Medical History  Diagnosis Date  . Hyperlipidemia   . Hypertrophic obstructive cardiomyopathy   . Atrial fibrillation 09/2008    hx of  . Atrial flutter 09/2008    hx of  . Psoriasis   . Allergic rhinitis   . Hx of colonoscopy     PAST SURGICAL HISTORY:   Past Surgical History  Procedure Date  . Tonsillectomy   . Colonoscopy   . Polypectomy     SOCIAL HISTORY:   History   Social History  . Marital Status: Married    Spouse Name: N/A    Number of Children: 0  . Years of Education: N/A   Occupational History  . [employer] and [employer] professor   Social History Main Topics  . Smoking status: Former Smoker    Types: Cigarettes    Quit date: 07/27/1970  . Smokeless tobacco: Never Used  . Alcohol Use: 1.5 oz/week    3 drink(s) per week     Comment: 2 beers a week (at most), 1 vodka every other week  . Drug Use: Yes     Comment: Smokes marijuana once a month  . Sexually Active: Not on file   Other Topics Concern  . Not on file   Social History Narrative  . No narrative on file    FAMILY HISTORY:   Family Status  Relation Status Death Age  . Father Deceased     AD, pneumonia  . Mother Alive     healthy  . Brother Alive     2, HIV positive  . Sister Alive     healthy    ROS:  A complete 10 system review of systems was obtained and was unremarkable apart from what is mentioned above.  PHYSICAL EXAMINATION:    VITALS:   Filed Vitals:   04/04/12 1024  BP: 108/72  Pulse: 64  Temp: 97.6 F (36.4 C)  Resp: 16  Weight: 218 lb (98.884 kg)    GEN:  The patient appears stated age and is in NAD. HEENT:  Normocephalic, atraumatic.  The mucous membranes are moist. The superficial temporal arteries are without ropiness or tenderness. CV:  RRR with 3/6 SEM Lungs:  CTAB Neck/HEME:  There are no carotid bruits bilaterally.  Neurological examination:  Orientation: The patient is alert and oriented x3. Fund of knowledge is appropriate.  Recent and remote memory are intact.  Attention and concentration are normal.    Able to name objects and repeat phrases. Cranial nerves: There is good facial symmetry. There is minor facial hypomimia.  Pupils are equal round and reactive to light bilaterally. Fundoscopic exam reveals clear margins bilaterally. Extraocular muscles are intact. The visual fields are full to confrontational testing. The speech is fluent and clear. Soft palate rises symmetrically and there is no tongue deviation. Hearing is intact to conversational  tone. Sensation: Sensation is intact to light and pinprick throughout (facial, trunk, extremities). Vibration is intact at the bilateral big toe. There is no extinction with double simultaneous stimulation. There is no sensory dermatomal level identified. Motor: Strength is 5/5 in the bilateral upper and lower extremities.   Shoulder shrug is equal and symmetric.  There is  no pronator drift. Deep tendon reflexes: Deep tendon reflexes are 2+/4 at the bilateral biceps, triceps, brachioradialis, patella and achilles. Plantar responses are downgoing bilaterally.  Movement examination: Tone: There is increased tone in the R upper and lower extremities.  The tone in the L upper and lower extremities is normal.  Abnormal movements: There is just the slightest dyskinesia very rarely, primarily in the left shoulder region. Coordination:  There is no decremation with RAM's, including with alternating supination and pronation, hand opening and closing, heel taps, finger taps and toe taps. Gait and Station: The patient has no difficulty arising out of a deep-seated chair without the use of the Reaney. The patient's stride length is perhaps just slightly decreased but there is definitely decreased arm swing on the right.  Tremor slightly increased on the right with ambulation..  The patient has a negative pull test.      LABS:  Lab Results  Component Value Date   WBC 5.1 02/01/2012   HGB 14.8 02/01/2012   HCT 42.6 02/01/2012   MCV 87 02/01/2012   PLT 135* 02/01/2012     Chemistry      Component Value Date/Time   NA 139 08/23/2011 1044   K 4.1 08/23/2011 1044   CL 104 08/23/2011 1044   CO2 29 08/23/2011 1044   BUN 20 08/23/2011 1044   CREATININE 1.1 08/23/2011 1044      Component Value Date/Time   CALCIUM 9.5 08/23/2011 1044   ALKPHOS 52 08/23/2011 1044   AST 31 08/23/2011 1044   ALT 10 08/23/2011 1044   BILITOT 0.6 08/23/2011 1044     Lab Results  Component Value Date   VITAMINB12 247 04/11/2010   Lab  Results  Component Value Date   TSH 1.37 08/23/2011     ASSESSMENT:   1.  idiopathic, tremor predominant Parkinson's disease.  This is fairly early in onset.  This is evidenced by rigidity, tremor and minor bradykinesia.  He really has no postural instability.  I am seeing some minor motor fluctuations today, as evidenced by dyskinesia (very rare and minor). 2.  B12 deficiency.  This was last assessed in 2011 and B12 was at the lower end of normal.   PLAN:   1.  We discussed the diagnosis as well as pathophysiology of the disease.  We discussed treatment options as well as prognostic indicators.  Patient education was provided.  Greater than 50% of the 45 minute visit was spent in counseling with the patient and his wife. 2.  I gave the patient an invitation to our PD 101 lecture that we are giving next Thursday. 3.  I'm going to start him on a dopamine agonist, pramipexole.  I think that this is more likely to control tremor, and hopefully help with the rigidity as well.  He already has some minor dyskinesia and I do not want to increase the carbidopa/levodopa any further at this point.  Risks, benefits, side effects and alternative therapies were discussed.  The opportunity to ask questions was given and they were answered to the best of my ability.  The patient expressed understanding and willingness to follow the outlined treatment protocols.  We did talk about the risk of compulsions that compulsive behavior.  He will let me know if this is a problem.  We also talked about the risks of sleep attacks. 4.  I am going to do a B12 and chemistry panel on the patient today. 5.  Return in about 2 months (around  06/10/2012).      

## 2012-04-04 NOTE — Patient Instructions (Addendum)
1.  Start mirapex - 0.125 mg three times a day for a week, then 2 tablets three times a day for a week and then start the mirapex, 0.5 mg tablet three times per day. 2.  Continue carbidopa/levodopa, 2 tabs three times per day. 3.  We would love for you to attend our parkinsons lecture at the hospital.

## 2012-04-07 ENCOUNTER — Telehealth: Payer: Self-pay | Admitting: Neurology

## 2012-04-07 NOTE — Telephone Encounter (Signed)
Left a message for the patient to return my call.  

## 2012-04-07 NOTE — Telephone Encounter (Signed)
Message copied by Benay Spice on Mon Apr 07, 2012  8:48 AM ------      Message from: TAT, REBECCA S      Created: Mon Apr 07, 2012  8:13 AM       Tiffany,            Please tell pt that his B12 is a little low.  Have him start oral B12 - daily and we will recheck in the future.

## 2012-04-08 NOTE — Telephone Encounter (Signed)
Pt aware.

## 2012-06-04 ENCOUNTER — Other Ambulatory Visit: Payer: Self-pay | Admitting: *Deleted

## 2012-06-04 MED ORDER — DISOPYRAMIDE PHOSPHATE 150 MG PO CAPS: 150.0000 mg | ORAL_CAPSULE | Freq: Two times a day (BID) | ORAL | Status: DC

## 2012-06-05 ENCOUNTER — Encounter: Payer: Self-pay | Admitting: Internal Medicine

## 2012-06-05 ENCOUNTER — Ambulatory Visit (INDEPENDENT_AMBULATORY_CARE_PROVIDER_SITE_OTHER): Payer: BC Managed Care – PPO | Admitting: Internal Medicine

## 2012-06-05 VITALS — BP 110/68 | HR 62 | Temp 98.2°F | Wt 218.6 lb

## 2012-06-05 DIAGNOSIS — J209 Acute bronchitis, unspecified: Secondary | ICD-10-CM

## 2012-06-05 DIAGNOSIS — J069 Acute upper respiratory infection, unspecified: Secondary | ICD-10-CM

## 2012-06-05 DIAGNOSIS — H9319 Tinnitus, unspecified ear: Secondary | ICD-10-CM

## 2012-06-05 DIAGNOSIS — H9313 Tinnitus, bilateral: Secondary | ICD-10-CM

## 2012-06-05 MED ORDER — FLUTICASONE PROPIONATE 50 MCG/ACT NA SUSP: 1.0000 | Freq: Two times a day (BID) | NASAL | Status: DC | PRN

## 2012-06-05 MED ORDER — DOXYCYCLINE HYCLATE 100 MG PO TABS: 100.0000 mg | ORAL_TABLET | Freq: Two times a day (BID) | ORAL | Status: DC

## 2012-06-05 NOTE — Progress Notes (Signed)
  Subjective:    Patient ID: Jeff [name], male    DOB: 09/25/1950, 62 y.o.   MRN: [id number]  HPI The respiratory tract symptoms began 1 week ago as sore throat & head congestion with clear secretions.   He has residual head congestion, tinnitus, and dizziness. There's been no hearing loss.   Sputum was described as clear.     Treatment with  Mucinex DM, vitamin C , Echinacea, & ASA was partially effective  There is no history of asthma. The patient had  quit smoking in 1972                Review of Systems Symptoms not present included frontal headache, facial pain, dental pain, sore throat now, nasal purulence, earache , and otic discharge  Fever ,chills and sweats not present    Cough was not associated with shortness of breath or wheezing  Itchy , watery eyes & sneezing were not noted.       Objective:   Physical Exam General appearance:good health ;well nourished; no acute distress or increased work of breathing is present.  No  lymphadenopathy about the head, neck, or axilla noted.  Eyes: No conjunctival inflammation or lid edema is present. There is no nystagmus.Ptosis OS (followed by Neurology) Ears:  External ear exam shows no significant lesions or deformities.  Otoscopic examination reveals clear canals, tympanic membranes are intact bilaterally without bulging, retraction, inflammation or discharge. Whisper heard at 6 feet bilaterally. Tuning fork exam is normal Nose:  External nasal examination shows no deformity or inflammation. Nasal mucosa are eryhthematous without lesions or exudates. No septal dislocation or deviation.No obstruction to airflow.  Oral exam: Dental hygiene is good; lips and gums are healthy appearing.There is no oropharyngeal erythema or exudate noted.  Neck:  No deformities,  masses, or tenderness noted.   Supple with full range of motion without pain.  Heart:  Normal rate and regular rhythm. S1 and S2 normal without gallop,  click,  rub or other extra sounds.Grade 1-1.5/6 systolic murmur   LSB Lungs:Chest clear to auscultation; no wheezes, rhonchi,rales ,or rubs present.No increased work of breathing.   Extremities:  No cyanosis, edema, or clubbing  noted  Skin: Warm & dry .          Assessment & Plan:  #1 bronchitis and upper respiratory tract symptoms without frank rhinosinusitis. Associated tinnitus.  Plan: See orders and recommendations

## 2012-06-05 NOTE — Patient Instructions (Addendum)
Plain Mucinex for thick secretions ;force NON dairy fluids . Use a Neti pot daily as needed for sinus congestion; going from open side to congested side . Nasal cleansing in the shower as discussed. Make sure that all residual soap is removed to prevent irritation. Fluticasone 1 spray in each nostril twice a day as needed. Use the "crossover" technique as discussed.

## 2012-06-09 ENCOUNTER — Ambulatory Visit (INDEPENDENT_AMBULATORY_CARE_PROVIDER_SITE_OTHER): Payer: BC Managed Care – PPO | Admitting: Internal Medicine

## 2012-06-09 VITALS — BP 116/64 | HR 82 | Temp 101.1°F | Wt 217.0 lb

## 2012-06-09 DIAGNOSIS — N390 Urinary tract infection, site not specified: Secondary | ICD-10-CM | POA: Insufficient documentation

## 2012-06-09 LAB — POCT URINALYSIS DIPSTICK
Glucose, UA: NEGATIVE
Nitrite, UA: NEGATIVE
Urobilinogen, UA: 0.2

## 2012-06-09 MED ORDER — TAMSULOSIN HCL 0.4 MG PO CAPS: 0.4000 mg | ORAL_CAPSULE | Freq: Every day | ORAL | Status: DC

## 2012-06-09 MED ORDER — CIPROFLOXACIN HCL 500 MG PO TABS: 500.0000 mg | ORAL_TABLET | Freq: Two times a day (BID) | ORAL | Status: DC

## 2012-06-09 NOTE — Progress Notes (Signed)
  Subjective:    Patient ID: Jeff [name], male    DOB: 07-02-1950, 62 y.o.   MRN: [id number]  HPI Acute visit Was seen last week with URI type of symptoms, was recommended doxycycline, Flonase. He developed some diarrhea , thinks related to doxycycline.  Urinary symptoms started 06/07/2011: Initially he has urgency and couldn't hold his urine,   the next day, he cont w/  Urgency, frequency and very small amount of urine with each visit to the bathroom. He's not taking any OTC decongestants. Other than  the antibiotic and Flonase he isn't taking any new medication, started Mirapex about 3 months ago.   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 Denies any change in the color of the urine, actual dysuria or flank pain. No  nausea or vomiting. No blood in the stools.     Objective:   Physical Exam General -- alert, well-developed, and well-nourished.  Nontoxic appearing Lungs -- normal respiratory effort, no intercostal retractions, no accessory muscle use, and normal breath sounds.   Heart-- normal rate, regular rhythm, no murmur, and no gallop.   Abdomen--soft, non-tender, no distention, no masses, no HSM, no  guarding, and no rigidity.   Rectal-- No external abnormalities noted. Normal sphincter tone. No rectal masses or tenderness. Brown stool. Prostate:  Prostate gland firm and smooth, no enlargement, nodularity, tenderness, mass, asymmetry or induration. Neurologic-- alert & oriented X3 and strength normal in all extremities. Psych-- Cognition and judgment appear intact. Alert and cooperative with normal attention span and concentration.  not anxious appearing and not depressed appearing.        Assessment & Plan:

## 2012-06-09 NOTE — Assessment & Plan Note (Signed)
Symptoms consistent with a UTI, DRE failed to demonstrate a  tender or enlarged prostate. He is febrile but nontoxic appearing. Plan: Discontinue doxycycline Start  Cipro Flomax Office visit in 3 weeks

## 2012-06-09 NOTE — Patient Instructions (Addendum)
Drink plenty of clear  fluids, Tylenol as needed for fever. Start ciprofloxacin for 10 days Start Flomax one at night Call anytime if you have high fever, increased urinary symptoms, severe chills. Please come back in 3 weeks for another urine test (UA, urine culture--- dx  UTI )

## 2012-06-10 ENCOUNTER — Encounter: Payer: Self-pay | Admitting: Internal Medicine

## 2012-06-11 LAB — URINE CULTURE: Colony Count: >=100000

## 2012-06-15 ENCOUNTER — Telehealth: Payer: Self-pay | Admitting: Internal Medicine

## 2012-06-15 MED ORDER — AMPICILLIN 500 MG PO CAPS: 500.0000 mg | ORAL_CAPSULE | Freq: Four times a day (QID) | ORAL | Status: DC

## 2012-06-15 NOTE — Telephone Encounter (Signed)
Received a note from [facility] alerting me about a interaction of cipro and norpace. Called pt, LMOM  D/c cipro, ampicillin x 10 days, rx called in  [place], please call pt to be sure he got the message

## 2012-06-17 NOTE — Telephone Encounter (Signed)
Spoke to pt & he states that he did get the msg & he appreciated the phone call from Dr. Drue Novel.

## 2012-06-23 ENCOUNTER — Telehealth: Payer: Self-pay | Admitting: Hematology & Oncology

## 2012-06-23 ENCOUNTER — Telehealth: Payer: Self-pay | Admitting: Internal Medicine

## 2012-06-23 NOTE — Telephone Encounter (Signed)
According to our records he got the zoster last year but we do not have any records of a Tdap.

## 2012-06-23 NOTE — Telephone Encounter (Signed)
Pt offered appt using mychart

## 2012-06-23 NOTE — Telephone Encounter (Signed)
Please review and advise if patient needs the following vaccines & I Can schedule  Appointment Request From: Maurine Minister Bokhari  With Provider: [name], MD [-Primary Care Physician-]  Preferred Date Range: From 07/03/2012 To 07/09/2012  Preferred Times: Mon Morning, Tues Morning, Wed Morning, Thur Morning, Tues Afternoon  Reason: To address the following health maintenance concerns. Zostavax Tetanus/Tdap  Comments: also follow-up for UTI

## 2012-06-23 NOTE — Telephone Encounter (Signed)
Pt moved 3-6 to 3-27 °

## 2012-07-01 ENCOUNTER — Ambulatory Visit (INDEPENDENT_AMBULATORY_CARE_PROVIDER_SITE_OTHER): Payer: BC Managed Care – PPO | Admitting: *Deleted

## 2012-07-01 ENCOUNTER — Other Ambulatory Visit (INDEPENDENT_AMBULATORY_CARE_PROVIDER_SITE_OTHER): Payer: BC Managed Care – PPO

## 2012-07-01 DIAGNOSIS — Z23 Encounter for immunization: Secondary | ICD-10-CM

## 2012-07-01 DIAGNOSIS — N390 Urinary tract infection, site not specified: Secondary | ICD-10-CM

## 2012-07-01 LAB — POCT URINALYSIS DIPSTICK
Blood, UA: NEGATIVE
Glucose, UA: NEGATIVE
Ketones, UA: NEGATIVE
Spec Grav, UA: >=1.03
Urobilinogen, UA: 0.2

## 2012-07-11 ENCOUNTER — Encounter: Payer: Self-pay | Admitting: Internal Medicine

## 2012-07-20 ENCOUNTER — Other Ambulatory Visit: Payer: Self-pay | Admitting: Neurology

## 2012-07-21 ENCOUNTER — Telehealth: Payer: Self-pay | Admitting: Neurology

## 2012-07-21 ENCOUNTER — Other Ambulatory Visit: Payer: Self-pay | Admitting: Neurology

## 2012-07-21 MED ORDER — PRAMIPEXOLE DIHYDROCHLORIDE 0.5 MG PO TABS: 0.5000 mg | ORAL_TABLET | Freq: Three times a day (TID) | ORAL | Status: DC

## 2012-07-21 NOTE — Telephone Encounter (Signed)
Called and spoke with the patient. Scheduled f/u appointment for him with Dr. Arbutus Leas on 02/26 as he was over due and needed a med refill. Did refill the Mirapex 0.5 one TID  #90 with RF x 2.

## 2012-07-21 NOTE — Telephone Encounter (Signed)
The patient called to request refill of generic Mirapex 0.5 mg one tablet TID.  The patient may be reached at [phone].

## 2012-07-23 ENCOUNTER — Ambulatory Visit (INDEPENDENT_AMBULATORY_CARE_PROVIDER_SITE_OTHER): Payer: BC Managed Care – PPO | Admitting: Neurology

## 2012-07-23 ENCOUNTER — Encounter: Payer: Self-pay | Admitting: Neurology

## 2012-07-23 VITALS — BP 120/78 | HR 72 | Temp 98.1°F | Resp 16 | Wt 221.0 lb

## 2012-07-23 DIAGNOSIS — E538 Deficiency of other specified B group vitamins: Secondary | ICD-10-CM

## 2012-07-23 DIAGNOSIS — R209 Unspecified disturbances of skin sensation: Secondary | ICD-10-CM

## 2012-07-23 DIAGNOSIS — G20A1 Parkinson's disease without dyskinesia, without mention of fluctuations: Secondary | ICD-10-CM

## 2012-07-23 DIAGNOSIS — G20 Parkinson's disease: Secondary | ICD-10-CM

## 2012-07-23 DIAGNOSIS — R202 Paresthesia of skin: Secondary | ICD-10-CM

## 2012-07-23 MED ORDER — CARBIDOPA-LEVODOPA 25-100 MG PO TABS: 2.0000 | ORAL_TABLET | Freq: Three times a day (TID) | ORAL | Status: DC

## 2012-07-23 NOTE — Progress Notes (Signed)
Jeff [name] was seen today in the movement disorders clinic for neurologic f/u for PD.  His wife reports that tremor began intermittently 10 years ago but PD was dx by Dr. Modesto Charon on 07/27/11.  The patient stated that he quit using the laser pointer in the R hand over the last year b/c of tremor.  His wife would note that tremor would occur at rest.  Tremor does not interfere with guitar playing and he does not notice if it he is using the [place].  He has noted that his R foot feels odd and stiff.  It is not painful.  Last visit, I added Mirapex to the Levodopa he was on.  He is on carbidopa/levodopa 25/102 tablets 3 times a day.  His Mirapex is 0.5 mg 3 times a day.  He states that he is doing very well, although he does not know if he is noticed a big difference with the addition of Mirapex.  Wearing off:  no  How long before next dose:  n/a Falls:   no N/V:  no Hallucinations:  no  visual distortions: no (some flashing lights, eye exam summer 2013 negative) N/V:  no Lightheaded:  no  Syncope: no Dyskinesia:  no       His B12 was somewhat last visit and I asked him to start on oral B12, 1000 mcg daily.  He is doing this.  He does complain of some intermittent paresthesias in the posterior aspect of the right leg and behind the right knee and occasionally will have a severe shooting pain into the right great toe.  He has no back pain with this.    PREVIOUS MEDICATIONS: Sinemet  ALLERGIES:  No Known Allergies  CURRENT MEDICATIONS:  Current Outpatient Prescriptions on File Prior to Visit  Medication Sig Dispense Refill  . amoxicillin (AMOXIL) 500 MG capsule 4 tabs pre dental work       . ampicillin (PRINCIPEN) 500 MG capsule Take 1 capsule (500 mg total) by mouth 4 (four) times daily.  40 capsule  0  . aspirin EC 325 MG tablet Take 325 mg by mouth daily.        . carbidopa-levodopa (SINEMET IR) 25-100 MG per tablet Take 2 tablets by mouth 3 (three) times daily.  180 tablet  3  .  clobetasol cream (TEMOVATE) 0.05 % Apply topically 2 (two) times daily as needed.  30 g  1  . disopyramide (NORPACE) 150 MG capsule Take 1 capsule (150 mg total) by mouth 2 (two) times daily.  180 capsule  1  . fluticasone (FLONASE) 50 MCG/ACT nasal spray Place 1 spray into the nose 2 (two) times daily as needed for rhinitis.  16 g  2  . metoprolol succinate (TOPROL-XL) 25 MG 24 hr tablet TAKE 1 TABLET BY MOUTH DAILY  90 tablet  1  . pramipexole (MIRAPEX) 0.5 MG tablet Take 1 tablet (0.5 mg total) by mouth 3 (three) times daily.  90 tablet  2  . selenium sulfide (SELSUN) 2.5 % shampoo USE AS DIRECTED  120 mL  5  . simvastatin (ZOCOR) 40 MG tablet TAKE ONE TABLET BY MOUTH EVERY NIGHT AT BEDTIME  30 tablet  6  . Tamsulosin HCl (FLOMAX) 0.4 MG CAPS Take 1 capsule (0.4 mg total) by mouth daily.  30 capsule  3  . vitamin B-12 (CYANOCOBALAMIN) 1000 MCG tablet Take 1,000 mcg by mouth daily.       No current facility-administered medications on file prior to visit.  PAST MEDICAL HISTORY:   Past Medical History  Diagnosis Date  . Hyperlipidemia   . Hypertrophic obstructive cardiomyopathy   . Atrial fibrillation 09/2008    hx of  . Atrial flutter 09/2008    hx of  . Psoriasis   . Allergic rhinitis   . Hx of colonoscopy     PAST SURGICAL HISTORY:   Past Surgical History  Procedure Laterality Date  . Tonsillectomy    . Colonoscopy    . Polypectomy      SOCIAL HISTORY:   History   Social History  . Marital Status: Married    Spouse Name: N/A    Number of Children: 0  . Years of Education: N/A   Occupational History  . [employer] and [employer] professor   Social History Main Topics  . Smoking status: Former Smoker    Types: Cigarettes    Quit date: 07/27/1970  . Smokeless tobacco: Never Used  . Alcohol Use: 1.5 oz/week    3 drink(s) per week     Comment: 2 beers a week (at most), 1 vodka every other week  . Drug Use: Yes     Comment: Smokes  marijuana once a month  . Sexually Active: Not on file   Other Topics Concern  . Not on file   Social History Narrative  . No narrative on file    FAMILY HISTORY:   Family Status  Relation Status Death Age  . Father Deceased     AD, pneumonia  . Mother Alive     healthy  . Brother Alive     2, HIV positive  . Sister Alive     healthy    ROS:  A complete 10 system review of systems was obtained and was unremarkable apart from what is mentioned above.  PHYSICAL EXAMINATION:    VITALS:   There were no vitals filed for this visit.  GEN:  The patient appears stated age and is in NAD. HEENT:  Normocephalic, atraumatic.  The mucous membranes are moist. The superficial temporal arteries are without ropiness or tenderness. CV:  RRR with 3/6 SEM Lungs:  CTAB Neck/HEME:  There are no carotid bruits bilaterally.  Neurological examination:  Orientation: The patient is alert and oriented x3. Fund of knowledge is appropriate.  Recent and remote memory are intact.  Attention and concentration are normal.    Able to name objects and repeat phrases. Cranial nerves: There is good facial symmetry. There is minor facial hypomimia.  Pupils are equal round and reactive to light bilaterally. Fundoscopic exam reveals clear margins bilaterally. Extraocular muscles are intact. The visual fields are full to confrontational testing. The speech is fluent and clear. Soft palate rises symmetrically and there is no tongue deviation. Hearing is intact to conversational tone. Sensation: Sensation is intact to light and pinprick throughout (facial, trunk, extremities). Vibration is intact at the bilateral big toe. There is no extinction with double simultaneous stimulation. There is no sensory dermatomal level identified. Motor: Strength is 5/5 in the bilateral upper and lower extremities.   Shoulder shrug is equal and symmetric.  There is no pronator drift. Deep tendon reflexes: Deep tendon reflexes are 2+/4  at the bilateral biceps, triceps, brachioradialis, patella and achilles. Plantar responses are downgoing bilaterally.  Movement examination: Tone: There is mild increased tone in the right upper extremity (improved since last visit) these and normal tone in the right lower extremity.  The tone in the  L upper and lower extremities is normal.  Abnormal movements: There is just the slightest dyskinesia very rarely, primarily in the left shoulder region.  There is rare intermittent tremor of the right upper tremor the. Coordination:  There is no decremation with RAM's, including with alternating supination and pronation, hand opening and closing, heel taps, finger taps and toe taps. Gait and Station: The patient has no difficulty arising out of a deep-seated chair without the use of the Mccandlish. The patient's stride length is normal today and arm swing on the R is improved c/t last visit.    The patient has a negative pull test.      LABS:  Lab Results  Component Value Date   WBC 5.1 02/01/2012   HGB 14.8 02/01/2012   HCT 42.6 02/01/2012   MCV 87 02/01/2012   PLT 135* 02/01/2012     Chemistry      Component Value Date/Time   NA 141 04/04/2012 1132   K 4.0 04/04/2012 1132   CL 108 04/04/2012 1132   CO2 27 04/04/2012 1132   BUN 20 04/04/2012 1132   CREATININE 1.1 04/04/2012 1132      Component Value Date/Time   CALCIUM 9.0 04/04/2012 1132   ALKPHOS 52 08/23/2011 1044   AST 31 08/23/2011 1044   ALT 10 08/23/2011 1044   BILITOT 0.6 08/23/2011 1044     Lab Results  Component Value Date   VITAMINB12 276 04/04/2012   Lab Results  Component Value Date   TSH 1.37 08/23/2011     ASSESSMENT/PLAN:  1.  Idiopathic, tremor predominant Parkinson's disease.  This is fairly early in onset, dx 07/2011.  This is evidenced by rigidity, tremor and minor bradykinesia.  He really has no postural instability.  I am seeing some minor motor fluctuations today, as evidenced by dyskinesia (very rare and minor).  -he will  remain on his carbidopa/levodopa 25/102 tablets 3 times a day.  - He will remain on Mirapex 0.5 mg.  Clinically, he has improved on the Mirapex objectively.  He has had no compulsive behaviors.  -Risks, benefits, side effects and alternative therapies were discussed.  The opportunity to ask questions was given and they were answered to the best of my ability.  The patient expressed understanding and willingness to follow the outlined treatment protocols.  -He had several questions about these symptoms potentially in the future and we discussed this today.  Patient education was provided. 2.  B12 deficiency.    -he is on oral B12 supplementation and I am going to recheck his B12 level today. 3.  Right leg paresthesias.  This sounds like it could be potentially sciatica.  -I am going to have him move his wallet from the right back pocket to the left.  If that does not help, he is to call me and we can consider an EMG.  He has no associated back pain.   4.    Other problems of potential neurologic relevance Patient Active Problem List   Diagnosis Date Noted  . UTI (lower urinary tract infection) 06/09/2012  . Thrombocytopenia 02/25/2012  . Annual physical exam 08/23/2011  . Paralysis agitans 07/27/2011  . Eczema 06/19/2011  . BENIGN PROSTATIC HYPERTROPHY, MILD, HX OF 04/11/2009  . ATRIAL FIBRILLATION 11/08/2008  . ATRIAL FLUTTER 11/08/2008  . HYPERLIPIDEMIA 07/29/2008  . Parkinson disease 07/29/2008  . HYPERTROPHIC OBSTRUCTIVE CARDIOMYOPATHY 07/29/2008  . ALLERGIC RHINITIS 07/29/2008   5.  F/u 4-5 months.

## 2012-07-23 NOTE — Patient Instructions (Addendum)
We will check your B12 level today.  We will see you back in about five months.

## 2012-07-25 ENCOUNTER — Telehealth: Payer: Self-pay | Admitting: Internal Medicine

## 2012-07-25 MED ORDER — FLUTICASONE PROPIONATE 50 MCG/ACT NA SUSP: 2.0000 | Freq: Every day | NASAL | Status: DC

## 2012-07-25 NOTE — Telephone Encounter (Signed)
Pt requesting refill for flonase that Dr. Alwyn Ren prescribed him in January. Patient with appt on 07/29/12 for uti and head congestion. Pt uses [facility] on [address]

## 2012-07-25 NOTE — Telephone Encounter (Signed)
Ok x 6 months , OV if sx severe or not better

## 2012-07-25 NOTE — Telephone Encounter (Signed)
Ok to go ahead & refill flonase?

## 2012-07-25 NOTE — Telephone Encounter (Signed)
Refill done.  Pt has appt scheduled 3.4.14

## 2012-07-29 ENCOUNTER — Ambulatory Visit (INDEPENDENT_AMBULATORY_CARE_PROVIDER_SITE_OTHER): Payer: BC Managed Care – PPO | Admitting: Internal Medicine

## 2012-07-29 ENCOUNTER — Encounter: Payer: Self-pay | Admitting: Internal Medicine

## 2012-07-29 VITALS — BP 124/84 | HR 60 | Temp 97.9°F | Wt 220.0 lb

## 2012-07-29 DIAGNOSIS — N390 Urinary tract infection, site not specified: Secondary | ICD-10-CM

## 2012-07-29 LAB — POCT URINALYSIS DIPSTICK
Bilirubin, UA: NEGATIVE
Glucose, UA: NEGATIVE
Ketones, UA: NEGATIVE
Leukocytes, UA: NEGATIVE
pH, UA: 5

## 2012-07-29 NOTE — Assessment & Plan Note (Signed)
Recently diagnosed with UTI, status post Cipro, currently asymptomatic. No previous documented UTI. Plan:  Recheck a urine culture. No further workup unless he has another UTI or urine culture shows infection again. Use Flomax as needed.

## 2012-07-29 NOTE — Patient Instructions (Addendum)
Next visit in 3-4 months for a physical 

## 2012-07-29 NOTE — Progress Notes (Signed)
  Subjective:    Patient ID: Jeff [name], male    DOB: December 20, 1950, 62 y.o.   MRN: [id number]  HPI Followup from previous visit Was diagnosed with a UTI, status post Cipro, symptoms quickly resolved.  Past Medical History  Diagnosis Date  . Hyperlipidemia   . Hypertrophic obstructive cardiomyopathy   . Atrial fibrillation 09/2008    hx of  . Atrial flutter 09/2008    hx of  . Psoriasis   . Allergic rhinitis   . Parkinsonism     Past Surgical History  Procedure Laterality Date  . Tonsillectomy    . Colonoscopy    . Polypectomy      Review of Systems Currently asymptomatic, denies any dysuria, difficulty urinating or gross hematuria. Was not taking Flomax prior to the UTI.    Objective:   Physical Exam General -- alert, well-developed Abdomen--soft, non-tender, no distention, no masses, no HSM, no guarding, and no rigidity.  No CVA tenderness Extremities-- no pretibial edema bilaterally  Neurologic-- alert & oriented X3 and strength normal in all extremities. Psych-- Cognition and judgment appear intact. Alert and cooperative with normal attention span and concentration.  not anxious appearing and not depressed appearing.      Assessment & Plan:

## 2012-07-31 ENCOUNTER — Ambulatory Visit: Payer: BC Managed Care – PPO | Admitting: Hematology & Oncology

## 2012-07-31 ENCOUNTER — Other Ambulatory Visit: Payer: BC Managed Care – PPO | Admitting: Lab

## 2012-08-11 ENCOUNTER — Telehealth: Payer: Self-pay | Admitting: Hematology & Oncology

## 2012-08-11 ENCOUNTER — Telehealth: Payer: Self-pay | Admitting: Internal Medicine

## 2012-08-11 ENCOUNTER — Encounter: Payer: Self-pay | Admitting: Hematology & Oncology

## 2012-08-11 NOTE — Telephone Encounter (Signed)
Left message moved 3-27 to 4-1

## 2012-08-11 NOTE — Telephone Encounter (Signed)
Pt moved 4-1 to 4-18. 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 I offered him multiple days and he chose 4-18.

## 2012-08-11 NOTE — Telephone Encounter (Signed)
Refill- metoprolol er succinate 25mg  tab. Take one tablet by mouth daily. Qty 90 last fill 12.2.13

## 2012-08-11 NOTE — Telephone Encounter (Signed)
According to pt's med list Dr. Daleen Squibb has been refilling this med. OK to refill? Please advise.

## 2012-08-12 MED ORDER — METOPROLOL SUCCINATE ER 25 MG PO TB24: ORAL_TABLET | ORAL | Status: DC

## 2012-08-12 NOTE — Telephone Encounter (Signed)
Refill done.  

## 2012-08-12 NOTE — Telephone Encounter (Signed)
Ok RF x 1 year 

## 2012-08-15 ENCOUNTER — Ambulatory Visit (INDEPENDENT_AMBULATORY_CARE_PROVIDER_SITE_OTHER): Payer: BC Managed Care – PPO | Admitting: Internal Medicine

## 2012-08-15 VITALS — BP 128/82 | HR 54 | Temp 98.2°F | Wt 219.0 lb

## 2012-08-15 DIAGNOSIS — J309 Allergic rhinitis, unspecified: Secondary | ICD-10-CM

## 2012-08-15 MED ORDER — BECLOMETHASONE DIPROPIONATE 80 MCG/ACT NA AERS: 2.0000 | INHALATION_SPRAY | Freq: Every day | NASAL | Status: DC

## 2012-08-15 MED ORDER — FEXOFENADINE HCL 60 MG PO TABS: 60.0000 mg | ORAL_TABLET | Freq: Two times a day (BID) | ORAL | Status: DC | PRN

## 2012-08-15 MED ORDER — PREDNISONE 10 MG PO TABS: ORAL_TABLET | ORAL | Status: DC

## 2012-08-15 NOTE — Patient Instructions (Addendum)
Saline irrigation Qnasl daily Prednisone x few days allegra as needed only

## 2012-08-15 NOTE — Progress Notes (Signed)
  Subjective:    Patient ID: Jeff [name], male    DOB: February 23, 1951, 62 y.o.   MRN: [id number]  HPI Chief complaint today is head congestion, sinus and nose congestion, going on since January. Flonase and Claritin helped  Temporarily. Symptoms are increased lately.   Past Medical History  Diagnosis Date  . Hyperlipidemia   . Hypertrophic obstructive cardiomyopathy   . Atrial fibrillation 09/2008    hx of  . Atrial flutter 09/2008    hx of  . Psoriasis   . Allergic rhinitis   . Parkinsonism    Past Surgical History  Procedure Laterality Date  . Tonsillectomy    . Colonoscopy    . Polypectomy      Review of Systems  no fever chills. Denies itchy eyes or nose. No sneezing. No chest congestion Some cough, he thinks related to postnasal dripping. Nasal discharge is clear and sometimes sees blood particularly after he uses Flonase. Occasional ear ringing.     Objective:   Physical Exam  General -- alert, well-developed  HEENT -- TMs normal, throat w/o redness, face symmetric and not tender to palpation, nose slt increased   Lungs -- normal respiratory effort, no intercostal retractions, no accessory muscle use, and normal breath sounds.   Heart-- normal rate, regular rhythm, no murmur, and no gallop.   Neurologic-- alert & oriented X3 and strength normal in all extremities. Psych-- Cognition and judgment appear intact. Alert and cooperative with normal attention span and concentration.  not anxious appearing and not depressed appearing.      Assessment & Plan:

## 2012-08-16 ENCOUNTER — Encounter: Payer: Self-pay | Admitting: Internal Medicine

## 2012-08-16 NOTE — Assessment & Plan Note (Signed)
symptoms most likely related to allergies. Recommend nasal irrigations, change nasal steroids to qnasl, change antihistaminics to Allegra, prednisone for temporary relief. Patient will call if not better.

## 2012-08-21 ENCOUNTER — Ambulatory Visit: Payer: BC Managed Care – PPO | Admitting: Hematology & Oncology

## 2012-08-21 ENCOUNTER — Other Ambulatory Visit: Payer: BC Managed Care – PPO | Admitting: Lab

## 2012-08-26 ENCOUNTER — Ambulatory Visit: Payer: BC Managed Care – PPO | Admitting: Hematology & Oncology

## 2012-08-26 ENCOUNTER — Other Ambulatory Visit: Payer: BC Managed Care – PPO | Admitting: Lab

## 2012-09-05 ENCOUNTER — Encounter: Payer: Self-pay | Admitting: Gastroenterology

## 2012-09-05 ENCOUNTER — Encounter: Payer: Self-pay | Admitting: Neurology

## 2012-09-11 ENCOUNTER — Encounter: Payer: Self-pay | Admitting: Gastroenterology

## 2012-09-12 ENCOUNTER — Other Ambulatory Visit (HOSPITAL_BASED_OUTPATIENT_CLINIC_OR_DEPARTMENT_OTHER): Payer: BC Managed Care – PPO | Admitting: Lab

## 2012-09-12 ENCOUNTER — Ambulatory Visit (HOSPITAL_BASED_OUTPATIENT_CLINIC_OR_DEPARTMENT_OTHER): Payer: BC Managed Care – PPO | Admitting: Hematology & Oncology

## 2012-09-12 VITALS — BP 128/74 | HR 59 | Temp 97.9°F | Resp 18 | Ht 74.0 in | Wt 219.0 lb

## 2012-09-12 DIAGNOSIS — D696 Thrombocytopenia, unspecified: Secondary | ICD-10-CM

## 2012-09-12 LAB — CBC WITH DIFFERENTIAL (CANCER CENTER ONLY)
BASO%: 0.3 % (ref 0.0–2.0)
EOS%: 2.7 % (ref 0.0–7.0)
HCT: 43.2 % (ref 38.7–49.9)
LYMPH%: 23.6 % (ref 14.0–48.0)
MCH: 29.7 pg (ref 28.0–33.4)
MCHC: 34.3 g/dL (ref 32.0–35.9)
MCV: 87 fL (ref 82–98)
MONO#: 0.6 10*3/uL (ref 0.1–0.9)
MONO%: 8.9 % (ref 0.0–13.0)
NEUT%: 64.5 % (ref 40.0–80.0)
RDW: 13.2 % (ref 11.1–15.7)

## 2012-09-12 NOTE — Progress Notes (Signed)
This office note has been dictated.

## 2012-09-15 NOTE — Progress Notes (Signed)
CC:   Jeff Ora, MD [name], MD 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  CURRENT THERAPY:  Observation.  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  It is always so much fun talking to him.  He is an Occupational hygienist at [employer].  He actually brought in a piece of meteorite that hit the earth back in [place] that is 57 billion years old.  He is doing well.  He has had no complaints at all.  There is no bleeding or bruising.  He has had no fevers, sweats, or chills.  He has had no nausea or vomiting.  There has been no change in his medications.  PHYSICAL EXAMINATION:  General:  This is a well-developed, well- nourished white gentleman in no obvious distress.  Vital signs: Temperature of 97.8, pulse 59, respiratory rate 18, blood pressure 128/74.  Weight is 219.  Head and neck:  Normocephalic, atraumatic skull.  There are no ocular or oral lesions.  There are no palpable cervical or supraclavicular lymph nodes.  Lungs:  Clear to percussion and auscultation bilaterally.  Cardiac:  Regular rate and rhythm, with a normal S1 and S2.  There are no murmurs, rubs or bruits.  Abdomen:  Soft with good bowel sounds.  There is no palpable abdominal mass.  There is no fluid wave.  There is no palpable hepatosplenomegaly.  Back:  No tenderness over the spine, ribs, or hips.  Extremities:  Show no clubbing, cyanosis or edema.  Skin:  No rash, ecchymosis, or petechia.  LABORATORY STUDIES:  White cell count is 6.3, hemoglobin 14.8, hematocrit 43.2, platelet count 145,000.  MCV is 87.  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 His platelet count continues to trend upward.  His platelet count really has not been all that bad ever since we first saw him about a year ago.  At this point in time, I think we can probably get him back in about 8 months.  I think if everything looks good in 8 months,  then we can likely let him go from the practice.  I do not see a need for a bone marrow biopsy.  Of note, his blood smear shows his platelets to be well formed.  He has several large platelets.  Platelets are well granulated.  There are no nucleated red blood cells.  He has no schistocytes.  White cells are with no hypersegmented polys.  There are no immature myeloid cells.    ______________________________ Jeff [name], M.D. PRE/MEDQ  D:  09/12/2012  T:  09/13/2012  Job:  [id number]

## 2012-09-30 ENCOUNTER — Other Ambulatory Visit: Payer: Self-pay | Admitting: Internal Medicine

## 2012-09-30 NOTE — Telephone Encounter (Signed)
Refill done.  Pt is due for labs.

## 2012-09-30 NOTE — Telephone Encounter (Signed)
Refill done.  Pt is due for lab work.  

## 2012-10-01 ENCOUNTER — Telehealth: Payer: Self-pay

## 2012-10-01 NOTE — Telephone Encounter (Signed)
Pt requesting refill of pramipexole 0.5mg , also can he have a 90 day supply?  [facility] [address]

## 2012-10-01 NOTE — Telephone Encounter (Signed)
Yes.  Please give 90 day supply with 1 refill

## 2012-10-02 ENCOUNTER — Other Ambulatory Visit: Payer: Self-pay | Admitting: Neurology

## 2012-10-02 MED ORDER — PRAMIPEXOLE DIHYDROCHLORIDE 0.5 MG PO TABS: 0.5000 mg | ORAL_TABLET | Freq: Three times a day (TID) | ORAL | Status: DC

## 2012-10-02 NOTE — Telephone Encounter (Signed)
Done

## 2012-10-06 ENCOUNTER — Telehealth: Payer: Self-pay | Admitting: Cardiology

## 2012-10-06 NOTE — Telephone Encounter (Signed)
New problem   Pt would like to thank Dr Daleen Squibb for the letter and thank him for all work he has done to keep him alive. THANK YOU THANK YOU THANK YOU

## 2012-10-26 HISTORY — PX: COLONOSCOPY: SHX174

## 2012-10-28 ENCOUNTER — Other Ambulatory Visit: Payer: Self-pay | Admitting: Internal Medicine

## 2012-10-28 NOTE — Telephone Encounter (Signed)
Spoke to pt, he needs lab work. Pt stated he would call back today to schedule lab work.  Refill done.

## 2012-10-29 ENCOUNTER — Other Ambulatory Visit: Payer: Self-pay | Admitting: *Deleted

## 2012-10-29 ENCOUNTER — Other Ambulatory Visit (INDEPENDENT_AMBULATORY_CARE_PROVIDER_SITE_OTHER): Payer: BC Managed Care – PPO

## 2012-10-29 DIAGNOSIS — D696 Thrombocytopenia, unspecified: Secondary | ICD-10-CM

## 2012-10-29 NOTE — Progress Notes (Signed)
A lab order was released today by another office that was to go towards the pt's office visit on 05/15/13.  Re-ordered CBC with smear.

## 2012-10-29 NOTE — Progress Notes (Signed)
Labs Only

## 2012-11-06 ENCOUNTER — Ambulatory Visit (AMBULATORY_SURGERY_CENTER): Payer: BC Managed Care – PPO | Admitting: *Deleted

## 2012-11-06 ENCOUNTER — Encounter: Payer: Self-pay | Admitting: Gastroenterology

## 2012-11-06 VITALS — Ht 77.0 in | Wt 224.2 lb

## 2012-11-06 DIAGNOSIS — Z1211 Encounter for screening for malignant neoplasm of colon: Secondary | ICD-10-CM

## 2012-11-12 ENCOUNTER — Other Ambulatory Visit: Payer: Self-pay

## 2012-11-12 MED ORDER — SIMVASTATIN 40 MG PO TABS: ORAL_TABLET | ORAL | Status: DC

## 2012-11-12 NOTE — Telephone Encounter (Signed)
Lipid/Hep 272.4/995.20  

## 2012-11-13 ENCOUNTER — Other Ambulatory Visit: Payer: Self-pay | Admitting: *Deleted

## 2012-11-13 MED ORDER — DISOPYRAMIDE PHOSPHATE 150 MG PO CAPS: 150.0000 mg | ORAL_CAPSULE | Freq: Two times a day (BID) | ORAL | Status: DC

## 2012-11-20 ENCOUNTER — Ambulatory Visit (AMBULATORY_SURGERY_CENTER): Payer: BC Managed Care – PPO | Admitting: Gastroenterology

## 2012-11-20 ENCOUNTER — Encounter: Payer: Self-pay | Admitting: Gastroenterology

## 2012-11-20 VITALS — BP 138/84 | HR 64 | Temp 96.8°F | Resp 56 | Ht 77.0 in | Wt 224.0 lb

## 2012-11-20 DIAGNOSIS — Z1211 Encounter for screening for malignant neoplasm of colon: Secondary | ICD-10-CM

## 2012-11-20 DIAGNOSIS — D126 Benign neoplasm of colon, unspecified: Secondary | ICD-10-CM

## 2012-11-20 DIAGNOSIS — Z86010 Personal history of colonic polyps: Secondary | ICD-10-CM

## 2012-11-20 MED ORDER — SODIUM CHLORIDE 0.9 % IV SOLN: 500.0000 mL | INTRAVENOUS | Status: DC

## 2012-11-20 NOTE — Progress Notes (Signed)
Called to room to assist during endoscopic procedure.  Patient ID and intended procedure confirmed with present staff. Received instructions for my participation in the procedure from the performing physician. ewm 

## 2012-11-20 NOTE — Op Note (Signed)
[facility] [address] [place], [address]   COLONOSCOPY PROCEDURE REPORT  PATIENT: Jeff [name], Jeff [name]  MR#: [id number] BIRTHDATE: June 14, 1950 , 61  yrs. old GENDER: Male ENDOSCOPIST: Meryl Dare, MD, [facility] PROCEDURE DATE:  11/20/2012 PROCEDURE:   Colonoscopy with snare polypectomy ASA CLASS:   Class II INDICATIONS:Patient's personal history of adenomatous colon polyps and Last colonoscopy performed 5 years ago. MEDICATIONS: MAC sedation, administered by CRNA and propofol (Diprivan) 200mg  IV DESCRIPTION OF PROCEDURE:   After the risks benefits and alternatives of the procedure were thoroughly explained, informed consent was obtained.  A digital rectal exam revealed no abnormalities of the rectum.   The LB [id number]  endoscope was introduced through the anus and advanced to the cecum, which was identified by both the appendix and ileocecal valve. No adverse events experienced.   The quality of the prep was adequate, using MoviPrep  The instrument was then slowly withdrawn as the colon was fully examined.  COLON FINDINGS: Two sessile polyps measuring 6-7 mm in size were found in the transverse colon.  A polypectomy was performed.  The resection was complete and the polyp tissue was completely retrieved.   Mild diverticulosis was noted in the sigmoid colon. The colon was otherwise normal.  There was no diverticulosis, inflammation, polyps or cancers unless previously stated. Retroflexed views revealed small internal hemorrhoids. The time to cecum=3 minutes 20 seconds.  Withdrawal time=13 minutes 16 seconds. The scope was withdrawn and the procedure completed. COMPLICATIONS: There were no complications.  ENDOSCOPIC IMPRESSION: 1.   Two sessile polyps measuring 6-7 mm in the transverse colon; polypectomy performed 2.   Mild diverticulosis was noted in the sigmoid colon 3.   Small internal hemorrhoids  RECOMMENDATIONS: 1.  Await pathology results 2.   High fiber diet with liberal fluid intake. 3.  Repeat Colonoscopy in 5 years.  eSigned:  Meryl Dare, MD, [facility] 11/20/2012 10:43 AM

## 2012-11-20 NOTE — Progress Notes (Signed)
Patient did not experience any of the following events: a burn prior to discharge; a fall within the facility; wrong site/side/patient/procedure/implant event; or a hospital transfer or hospital admission upon discharge from the facility. (G8907) Patient did not have preoperative order for IV antibiotic SSI prophylaxis. ([id number])  

## 2012-11-20 NOTE — Progress Notes (Signed)
Lidocaine-40mg IV prior to Propofol InductionPropofol given over incremental dosages 

## 2012-11-20 NOTE — Patient Instructions (Signed)
YOU HAD AN ENDOSCOPIC PROCEDURE TODAY AT THE [facility]: Refer to the procedure report that was given to you for any specific questions about what was found during the examination.  If the procedure report does not answer your questions, please call your gastroenterologist to clarify.  If you requested that your care partner not be given the details of your procedure findings, then the procedure report has been included in a sealed envelope for you to review at your convenience later.  YOU SHOULD EXPECT: Some feelings of bloating in the abdomen. Passage of more gas than usual.  Walking can help get rid of the air that was put into your GI tract during the procedure and reduce the bloating. If you had a lower endoscopy (such as a colonoscopy or flexible sigmoidoscopy) you may notice spotting of blood in your stool or on the toilet paper. If you underwent a bowel prep for your procedure, then you may not have a normal bowel movement for a few days.  DIET: Your first meal following the procedure should be a light meal and then it is ok to progress to your normal diet.  A half-sandwich or bowl of soup is an example of a good first meal.  Heavy or fried foods are harder to digest and may make you feel nauseous or bloated.  Likewise meals heavy in dairy and vegetables can cause extra gas to form and this can also increase the bloating.  Drink plenty of fluids but you should avoid alcoholic beverages for 24 hours.  ACTIVITY: Your care partner should take you home directly after the procedure.  You should plan to take it easy, moving slowly for the rest of the day.  You can resume normal activity the day after the procedure however you should NOT DRIVE or use heavy machinery for 24 hours (because of the sedation medicines used during the test).    SYMPTOMS TO REPORT IMMEDIATELY: A gastroenterologist can be reached at any hour.  During normal business hours, 8:30 AM to 5:00 PM Monday through Friday,  call [phone].  After hours and on weekends, please call the GI answering service at [phone] who will take a message and have the physician on call contact you.   Following lower endoscopy (colonoscopy or flexible sigmoidoscopy):  Excessive amounts of blood in the stool  Significant tenderness or worsening of abdominal pains  Swelling of the abdomen that is new, acute  Fever of 100F or higher    FOLLOW UP: If any biopsies were taken you will be contacted by phone or by letter within the next 1-3 weeks.  Call your gastroenterologist if you have not heard about the biopsies in 3 weeks.  Our staff will call the home number listed on your records the next business day following your procedure to check on you and address any questions or concerns that you may have at that time regarding the information given to you following your procedure. This is a courtesy call and so if there is no answer at the home number and we have not heard from you through the emergency physician on call, we will assume that you have returned to your regular daily activities without incident.  SIGNATURES/CONFIDENTIALITY: You and/or your care partner have signed paperwork which will be entered into your electronic medical record.  These signatures attest to the fact that that the information above on your After Visit Summary has been reviewed and is understood.  Full responsibility of the confidentiality   of this discharge information lies with you and/or your care-partner.     

## 2012-11-21 ENCOUNTER — Telehealth: Payer: Self-pay

## 2012-11-21 NOTE — Telephone Encounter (Signed)
  Follow up Call-  Call back number 11/20/2012  Post procedure Call Back phone  # [phone]  Permission to leave phone message Yes     Patient questions:  Do you have a fever, pain , or abdominal swelling? no Pain Score  0 *  Have you tolerated food without any problems? yes  Have you been able to return to your normal activities? yes  Do you have any questions about your discharge instructions: Diet   no Medications  no Follow up visit  no  Do you have questions or concerns about your Care? no  Actions: * If pain score is 4 or above: No action needed, pain <4.

## 2012-11-24 ENCOUNTER — Ambulatory Visit (INDEPENDENT_AMBULATORY_CARE_PROVIDER_SITE_OTHER): Payer: BC Managed Care – PPO | Admitting: Neurology

## 2012-11-24 ENCOUNTER — Encounter: Payer: Self-pay | Admitting: Neurology

## 2012-11-24 ENCOUNTER — Ambulatory Visit: Payer: BC Managed Care – PPO | Admitting: Neurology

## 2012-11-24 VITALS — BP 130/72 | HR 60 | Temp 97.6°F | Resp 16 | Wt 224.0 lb

## 2012-11-24 DIAGNOSIS — R279 Unspecified lack of coordination: Secondary | ICD-10-CM

## 2012-11-24 DIAGNOSIS — G20 Parkinson's disease: Secondary | ICD-10-CM

## 2012-11-24 DIAGNOSIS — G249 Dystonia, unspecified: Secondary | ICD-10-CM

## 2012-11-24 MED ORDER — PRAMIPEXOLE DIHYDROCHLORIDE 0.5 MG PO TABS: 0.5000 mg | ORAL_TABLET | Freq: Three times a day (TID) | ORAL | Status: DC

## 2012-11-24 MED ORDER — CARBIDOPA-LEVODOPA 25-100 MG PO TABS: 2.0000 | ORAL_TABLET | Freq: Three times a day (TID) | ORAL | Status: DC

## 2012-11-24 NOTE — Progress Notes (Signed)
Jeff [name] was seen today in the movement disorders clinic for neurologic f/u for PD.  His wife reports that tremor began intermittently 10 years ago but PD was dx by Dr. Modesto Charon on 07/27/11.  The patient stated that he quit using the laser pointer in the R hand over the last year b/c of tremor.  His wife would note that tremor would occur at rest.  Tremor does not interfere with guitar playing and he does not notice if it he is using the [place].  He has noted that his R foot feels odd and stiff.  It is not painful.  11/24/2012 update:   He is on carbidopa/levodopa 25/100, 2 tablets 3 times a day.  His Mirapex is 0.5 mg 3 times a day.  He states that he is doing very well, although he does not know if he has noticed a big difference with the addition of Mirapex.  He is noting that he has an ache in the R hand.  He is learning to play guitar and feels that there is a tightness in it.  The dexterity in it seems okay.  He keeps time with the R foot and if he does not, he will note that it moves on its own.  He has some cramping in the R great toe but is unsure if it is related to dosing.    Wearing off:  no  How long before next dose:  n/a Falls:   no N/V:  no Hallucinations:  no  visual distortions: no (some flashing lights, eye exam summer 2013 negative) N/V:  no Lightheaded:  no  Syncope: no Dyskinesia:  no  He does have a history of B12 deficiency.  His B12 was checked since last visit and it was 497.  He is on oral supplementation.    PREVIOUS MEDICATIONS: Sinemet  ALLERGIES:  No Known Allergies  CURRENT MEDICATIONS:  Current Outpatient Prescriptions on File Prior to Visit  Medication Sig Dispense Refill  . amoxicillin (AMOXIL) 500 MG capsule 4 tabs pre dental work       . aspirin EC 325 MG tablet Take 325 mg by mouth daily.        . carbidopa-levodopa (SINEMET IR) 25-100 MG per tablet Take 2 tablets by mouth 3 (three) times daily.  540 tablet  1  . clobetasol cream (TEMOVATE) 0.05 % Apply  topically 2 (two) times daily as needed.  30 g  1  . disopyramide (NORPACE) 150 MG capsule Take 1 capsule (150 mg total) by mouth 2 (two) times daily.  30 capsule  0  . fexofenadine (ALLEGRA) 60 MG tablet Take 1 tablet (60 mg total) by mouth 2 (two) times daily as needed.  60 tablet  3  . fluticasone (FLONASE) 50 MCG/ACT nasal spray Place 1 spray into the nose daily.       . metoprolol succinate (TOPROL-XL) 25 MG 24 hr tablet TAKE 1 TABLET BY MOUTH DAILY  90 tablet  3  . pramipexole (MIRAPEX) 0.5 MG tablet Take 1 tablet (0.5 mg total) by mouth 3 (three) times daily.  270 tablet  1  . simvastatin (ZOCOR) 40 MG tablet LABS OVERDUE, 1 by mouth daily at bedtime  30 tablet  0  . vitamin B-12 (CYANOCOBALAMIN) 1000 MCG tablet Take 1,000 mcg by mouth daily.       No current facility-administered medications on file prior to visit.    PAST MEDICAL HISTORY:   Past Medical History  Diagnosis Date  . Hyperlipidemia   .  Hypertrophic obstructive cardiomyopathy   . Atrial fibrillation 09/2008    hx of  . Atrial flutter 09/2008    hx of  . Psoriasis   . Allergic rhinitis   . Parkinsonism     PAST SURGICAL HISTORY:   Past Surgical History  Procedure Laterality Date  . Tonsillectomy    . Colonoscopy    . Polypectomy      SOCIAL HISTORY:   History   Social History  . Marital Status: Married    Spouse Name: N/A    Number of Children: 0  . Years of Education: N/A   Occupational History  . [employer] and [employer] professor   Social History Main Topics  . Smoking status: Former Smoker    Types: Cigarettes    Quit date: 07/27/1970  . Smokeless tobacco: Never Used  . Alcohol Use: 1.5 oz/week    3 drink(s) per week     Comment: 2 beers a week (at most), 1 vodka every other week  . Drug Use: Yes     Comment: Smokes marijuana once a month  . Sexually Active: Not on file   Other Topics Concern  . Not on file   Social History Narrative  . No narrative on  file    FAMILY HISTORY:   Family Status  Relation Status Death Age  . Father Deceased     AD, pneumonia  . Mother Alive     healthy  . Brother Alive     2, HIV positive  . Sister Alive     healthy    ROS:  A complete 10 system review of systems was obtained and was unremarkable apart from what is mentioned above.  PHYSICAL EXAMINATION:    VITALS:   There were no vitals filed for this visit.  GEN:  The patient appears stated age and is in NAD. HEENT:  Normocephalic, atraumatic.  The mucous membranes are moist. The superficial temporal arteries are without ropiness or tenderness. CV:  RRR with 3/6 SEM Lungs:  CTAB Neck/HEME:  There are no carotid bruits bilaterally.  Neurological examination:  Orientation: The patient is alert and oriented x3. Fund of knowledge is appropriate.  Recent and remote memory are intact.  Attention and concentration are normal.    Able to name objects and repeat phrases. Cranial nerves: There is good facial symmetry. There is minor facial hypomimia.  Pupils are equal round and reactive to light bilaterally. Fundoscopic exam reveals clear margins bilaterally. Extraocular muscles are intact. The visual fields are full to confrontational testing. The speech is fluent and clear. Soft palate rises symmetrically and there is no tongue deviation. Hearing is intact to conversational tone. Sensation: Sensation is intact to light and pinprick throughout (facial, trunk, extremities). Vibration is intact at the bilateral big toe. There is no extinction with double simultaneous stimulation. There is no sensory dermatomal level identified. Motor: Strength is 5/5 in the bilateral upper and lower extremities.   Shoulder shrug is equal and symmetric.  There is no pronator drift. Deep tendon reflexes: Deep tendon reflexes are 2+/4 at the bilateral biceps, triceps, brachioradialis, patella and achilles. Plantar responses are downgoing bilaterally.  Movement  examination: Tone: There is mild increased tone in the right upper extremity (improved since last visit) these and normal tone in the right lower extremity.  The tone in the L upper and lower extremities is normal.  Abnormal movements: There is just the slightest dyskinesia very rarely, primarily  in the right toes (prior L shoulder).  There is rare intermittent tremor of the right upper extremity. Coordination:  There is no decremation with RAM's, including with alternating supination and pronation, hand opening and closing, heel taps, finger taps and toe taps. Gait and Station: The patient has no difficulty arising out of a deep-seated chair without the use of the [place]. The patient's stride length is normal today and arm swing on the R is improved c/t last visit.    The patient has a negative pull test.      LABS:  Lab Results  Component Value Date   WBC 6.3 09/12/2012   HGB 14.8 09/12/2012   HCT 43.2 09/12/2012   MCV 87 09/12/2012   PLT 145 09/12/2012     Chemistry      Component Value Date/Time   NA 141 04/04/2012 1132   K 4.0 04/04/2012 1132   CL 108 04/04/2012 1132   CO2 27 04/04/2012 1132   BUN 20 04/04/2012 1132   CREATININE 1.1 04/04/2012 1132      Component Value Date/Time   CALCIUM 9.0 04/04/2012 1132   ALKPHOS 52 08/23/2011 1044   AST 31 08/23/2011 1044   ALT 10 08/23/2011 1044   BILITOT 0.6 08/23/2011 1044     Lab Results  Component Value Date   VITAMINB12 497 07/23/2012   Lab Results  Component Value Date   TSH 1.37 08/23/2011     ASSESSMENT/PLAN:  1.  Idiopathic, tremor predominant Parkinson's disease.  This is fairly early in onset, dx 07/2011.  This is evidenced by rigidity, tremor and minor bradykinesia.  He really has no postural instability.  I am seeing some minor motor fluctuations today, as evidenced by dyskinesia (very rare and minor).  -he will remain on his carbidopa/levodopa 25/100, 2 tablets 3 times a day.  - He will remain on Mirapex 0.5 mg.  Clinically, he  has improved on the Mirapex objectively.  He has had no compulsive behaviors.  -Risks, benefits, side effects and alternative therapies were discussed.  The opportunity to ask questions was given and they were answered to the best of my ability.  The patient expressed understanding and willingness to follow the outlined treatment protocols.  -I encouraged CV exercise. 2.  B12 deficiency.    -he is on oral B12 supplementation and I am going to recheck his B12 level today. 3.  Right leg paresthesias.  This sounds like it could be potentially sciatica.  -I am going to have him move his wallet from the right back pocket to the left.  He does not wish to proceed with further testing right now.   4.    Other problems of potential neurologic relevance Patient Active Problem List   Diagnosis Date Noted  . Vitamin B 12 deficiency 07/23/2012  . UTI (lower urinary tract infection) 06/09/2012  . Thrombocytopenia 02/25/2012  . Annual physical exam 08/23/2011  . Paralysis agitans 07/27/2011  . Eczema 06/19/2011  . BENIGN PROSTATIC HYPERTROPHY, MILD, HX OF 04/11/2009  . ATRIAL FIBRILLATION 11/08/2008  . ATRIAL FLUTTER 11/08/2008  . HYPERLIPIDEMIA 07/29/2008  . Parkinson disease 07/29/2008  . HYPERTROPHIC OBSTRUCTIVE CARDIOMYOPATHY 07/29/2008  . ALLERGIC RHINITIS 07/29/2008   5.  F/u 5-6 months.

## 2012-12-01 ENCOUNTER — Encounter: Payer: Self-pay | Admitting: Gastroenterology

## 2012-12-28 ENCOUNTER — Other Ambulatory Visit: Payer: Self-pay | Admitting: Internal Medicine

## 2012-12-29 ENCOUNTER — Telehealth: Payer: Self-pay | Admitting: Internal Medicine

## 2012-12-29 NOTE — Telephone Encounter (Signed)
Ok #30 and one refill. Please schedule a physical.

## 2012-12-29 NOTE — Telephone Encounter (Signed)
Discussed with patient, and appointment scheduled.

## 2012-12-29 NOTE — Telephone Encounter (Signed)
Ok to refill? Last OV 3.21.14 Last filled 6.18.14 for 1 month supply, requested OV at this time.  Last lipid check 3.28.13.  I have attempted to contact pt. To schedule OV.

## 2012-12-29 NOTE — Telephone Encounter (Signed)
Refill done per orders. Will mail letter requesting OV as well.

## 2012-12-29 NOTE — Telephone Encounter (Signed)
Patient left vm on triage line stating he is returning Stephanie's call.

## 2013-01-18 ENCOUNTER — Other Ambulatory Visit: Payer: Self-pay | Admitting: Cardiology

## 2013-01-26 ENCOUNTER — Other Ambulatory Visit: Payer: Self-pay | Admitting: Cardiology

## 2013-01-27 ENCOUNTER — Other Ambulatory Visit: Payer: Self-pay | Admitting: Cardiology

## 2013-01-27 NOTE — Telephone Encounter (Signed)
Rx has been refilled by Dr. Daleen Squibb.

## 2013-01-30 ENCOUNTER — Encounter: Payer: Self-pay | Admitting: Internal Medicine

## 2013-02-06 ENCOUNTER — Encounter: Payer: Self-pay | Admitting: Internal Medicine

## 2013-02-06 ENCOUNTER — Ambulatory Visit (INDEPENDENT_AMBULATORY_CARE_PROVIDER_SITE_OTHER): Payer: BC Managed Care – PPO | Admitting: Internal Medicine

## 2013-02-06 VITALS — BP 142/93 | HR 65 | Temp 98.6°F | Ht 76.3 in | Wt 223.0 lb

## 2013-02-06 DIAGNOSIS — G20A1 Parkinson's disease without dyskinesia, without mention of fluctuations: Secondary | ICD-10-CM

## 2013-02-06 DIAGNOSIS — N390 Urinary tract infection, site not specified: Secondary | ICD-10-CM

## 2013-02-06 DIAGNOSIS — R0989 Other specified symptoms and signs involving the circulatory and respiratory systems: Secondary | ICD-10-CM

## 2013-02-06 DIAGNOSIS — G20 Parkinson's disease: Secondary | ICD-10-CM

## 2013-02-06 DIAGNOSIS — B351 Tinea unguium: Secondary | ICD-10-CM

## 2013-02-06 DIAGNOSIS — Z0000 Encounter for general adult medical examination without abnormal findings: Secondary | ICD-10-CM

## 2013-02-06 DIAGNOSIS — E538 Deficiency of other specified B group vitamins: Secondary | ICD-10-CM

## 2013-02-06 DIAGNOSIS — Z23 Encounter for immunization: Secondary | ICD-10-CM

## 2013-02-06 DIAGNOSIS — G4700 Insomnia, unspecified: Secondary | ICD-10-CM

## 2013-02-06 DIAGNOSIS — I421 Obstructive hypertrophic cardiomyopathy: Secondary | ICD-10-CM

## 2013-02-06 NOTE — Assessment & Plan Note (Signed)
Having occasional paresthesias in the left toes ,recommend to discuss with neurology

## 2013-02-06 NOTE — Assessment & Plan Note (Signed)
Repeated urine culture was negative.

## 2013-02-06 NOTE — Patient Instructions (Signed)
Please come back fasting: CMP, FLP, TSH, PSA--- dx v70 Check the  blood pressure 2 or 3 times a week, be sure it is between 110/60 and 140/85. If it is consistently higher or lower, let me know Next visit in 6 months

## 2013-02-06 NOTE — Progress Notes (Signed)
Subjective:    Patient ID: Jeff [name], male    DOB: [birth year], 62 y.o.   MRN: [id number]  HPI Complete physical exam He also had a number of other issues, see assessment and plan.  Past Medical History  Diagnosis Date  . Hyperlipidemia   . Hypertrophic obstructive cardiomyopathy   . Atrial fibrillation 09/2008    hx of  . Atrial flutter 09/2008    hx of  . Psoriasis   . Allergic rhinitis   . Parkinsonism    Past Surgical History  Procedure Laterality Date  . Tonsillectomy    . Colonoscopy    . Polypectomy     History   Social History  . Marital Status: Married    Spouse Name: N/A    Number of Children: 0  . Years of Education: N/A   Occupational History  . [employer] and [employer] professor   Social History Main Topics  . Smoking status: Former Smoker    Types: Cigarettes    Quit date: 07/27/1970  . Smokeless tobacco: Never Used  . Alcohol Use: 1.5 oz/week    3 drink(s) per week     Comment: 2 beers a week (at most), 1 vodka every other week  . Drug Use: Yes     Comment: Smokes marijuana once a week  . Sexual Activity: Not on file   Other Topics Concern  . Not on file   Social History Narrative  . No narrative on file   Family History  Problem Relation Age of Onset  . Hypertension Neg Hx   . Stroke Neg Hx   . Colon cancer Neg Hx   . Prostate cancer Neg Hx   . Heart murmur Brother   . Alzheimer's disease Father   . Diabetes Father   . Heart disease Father       Review of Systems No chest pain or shortness or breath No  nausea, vomiting, diarrhea or blood in the stools. No dysuria gross hematuria. + Insomnia, see assessment and plan    Objective:   Physical Exam BP 142/93  Pulse 65  Temp(Src) 98.6 F (37 C)  Ht 6' 4.3" (1.938 m)  Wt 223 lb (101.152 kg)  BMI 26.93 kg/m2  SpO2 99% General -- alert, well-developed, NAD.  Neck --no thyromegaly , normal carotid pulse , no LAD  Lungs -- normal respiratory  effort, no intercostal retractions, no accessory muscle use, and normal breath sounds.  Heart-- normal rate, regular rhythm, + soft syst  murmur.  Abdomen-- Not distended, good bowel sounds,soft, non-tender. Mild bruit all quadrants, no mass  Rectal-- No external abnormalities noted. Normal sphincter tone. No rectal masses or tenderness. Brown stool  Prostate: Prostate gland firm and smooth, no enlargement, nodularity, tenderness, mass, asymmetry or induration. Extremities-- no pretibial edema bilaterally  Neurologic-- alert & oriented X3.    Psych-- Cognition and judgment appear intact. Alert and cooperative with normal attention span and concentration. not anxious appearing and not depressed appearing.       Assessment & Plan:  Other issues: Insomnia, waking up early unable to go back to sleep for the last few months. Denies any  usual distress, occasionally afternoon caffeine intake.No new medications. Information about good sleep habits provided.  Onychomycosis, has a third left toe nail dystrophy, using OTC antifungals, no improvement. We talk about possible po medication & liver toxicity, if he decides to go for treatment we'll have to do a nail culture  first.  BP slightly elevated today, recommend self-monitoring.  Spent  15 min in addition to the CPX discussing other issues

## 2013-02-06 NOTE — Assessment & Plan Note (Signed)
On supplements, last B12 few months ago normal

## 2013-02-06 NOTE — Assessment & Plan Note (Signed)
Last visit with cardiology 2 years ago, he is asymptomatic, refer  to cardiology for a checkup,

## 2013-02-06 NOTE — Assessment & Plan Note (Addendum)
Td ~ 2014 Shingles shot --02-2011 Flu shot today Colonoscopy:   11-2006, adenomatous polyp, cscope again 10-2012 diet, exercise discussed Abdominal bruit , see physical exam, will check an aorta and renal artery ultrasound. Labs

## 2013-02-11 ENCOUNTER — Encounter (HOSPITAL_COMMUNITY): Payer: BC Managed Care – PPO

## 2013-02-11 ENCOUNTER — Other Ambulatory Visit: Payer: BC Managed Care – PPO

## 2013-02-13 ENCOUNTER — Other Ambulatory Visit (INDEPENDENT_AMBULATORY_CARE_PROVIDER_SITE_OTHER): Payer: BC Managed Care – PPO

## 2013-02-13 DIAGNOSIS — Z0000 Encounter for general adult medical examination without abnormal findings: Secondary | ICD-10-CM

## 2013-02-13 LAB — COMPREHENSIVE METABOLIC PANEL
ALT: 19 U/L (ref 0–53)
AST: 36 U/L (ref 0–37)
Albumin: 4.3 g/dL (ref 3.5–5.2)
CO2: 30 mEq/L (ref 19–32)
Calcium: 9.2 mg/dL (ref 8.4–10.5)
Chloride: 104 mEq/L (ref 96–112)
Creatinine, Ser: 1 mg/dL (ref 0.4–1.5)
GFR: 82.11 mL/min (ref >60.00)
Potassium: 4.2 mEq/L (ref 3.5–5.1)

## 2013-02-13 LAB — LIPID PANEL: Total CHOL/HDL Ratio: 3

## 2013-02-16 ENCOUNTER — Encounter (INDEPENDENT_AMBULATORY_CARE_PROVIDER_SITE_OTHER): Payer: BC Managed Care – PPO

## 2013-02-16 DIAGNOSIS — R0989 Other specified symptoms and signs involving the circulatory and respiratory systems: Secondary | ICD-10-CM

## 2013-02-24 ENCOUNTER — Telehealth: Payer: Self-pay | Admitting: Internal Medicine

## 2013-02-24 ENCOUNTER — Ambulatory Visit (INDEPENDENT_AMBULATORY_CARE_PROVIDER_SITE_OTHER): Payer: BC Managed Care – PPO | Admitting: Family Medicine

## 2013-02-24 ENCOUNTER — Encounter: Payer: Self-pay | Admitting: Family Medicine

## 2013-02-24 VITALS — BP 108/64 | HR 80 | Temp 99.1°F | Wt 223.0 lb

## 2013-02-24 DIAGNOSIS — K4090 Unilateral inguinal hernia, without obstruction or gangrene, not specified as recurrent: Secondary | ICD-10-CM

## 2013-02-24 DIAGNOSIS — R1031 Right lower quadrant pain: Secondary | ICD-10-CM

## 2013-02-24 LAB — POCT URINALYSIS DIPSTICK
Bilirubin, UA: NEGATIVE
Glucose, UA: NEGATIVE
Ketones, UA: NEGATIVE
Spec Grav, UA: 1.015

## 2013-02-24 NOTE — Patient Instructions (Signed)

## 2013-02-24 NOTE — Progress Notes (Signed)
  Subjective:     Jeff [name] is a 62 y.o. male who presents for evaluation of abdominal pain. Onset was 1 week ago. Symptoms have been gradually worsening. The pain is described as sharp and tearing, and is 10/10 in intensity at its worst--now 1/10.Marland Kitchen Pain is located in the RLQ without radiation.  Aggravating factors: activity and standing.  Alleviating factors: resting. Associated symptoms: none. The patient denies anorexia, arthralagias, belching, chills, constipation, diarrhea, dysuria, fever, flatus, frequency, headache, hematochezia, hematuria, melena, myalgias, nausea, sweats and vomiting.  The patient's history has been marked as reviewed and updated as appropriate. Review of Systems Pertinent items are noted in HPI.     Objective:    BP 108/64  Pulse 80  Temp(Src) 99.1 F (37.3 C) (Oral)  Wt 223 lb (101.152 kg)  BMI 26.93 kg/m2  SpO2 96% General appearance: alert, cooperative, appears stated age and no distress Abdomen: abnormal findings:  inguinal herni on R --  no pain, reducible    Assessment:    Abdominal pain, likely secondary to inguinal hernia .    Plan:    Referral to General Surgery.  Pt instructed to go to the ER if pain increases

## 2013-02-24 NOTE — Telephone Encounter (Signed)
Encounter closed due to pt being scheduled with [facility].

## 2013-02-24 NOTE — Telephone Encounter (Signed)
Patient Information:  Caller Name: [place]  Phone: [phone]  Patient: Jeff [name], Jeff [name]  Gender: Male  DOB: 1950/06/02  Age: 62 Years  PCP: [name]  Office Follow Up:  Does the office need to follow up with this patient?: No  Instructions For The Office: N/A  RN Note:  spoke with Beth in office due to only 15 minute appt with Dr. Laury Axon. Assistance with scheduling. Care advice and call back parameters reviwed. Understanding expressed. encouraged to call back for questions changes or concerns.  Symptoms  Reason For Call & Symptoms: Patient states he is having a  Intermittent sharp pain on lower Right Quadrant. Onset middle of last week 02/18/13. Denies injury.  He had an episode of "something warm poured down the right quadrant "leaking" but there is nothing there , a tearing feeling "something torn". Denies any urinary issues. Worse with standing. and walking makes it worse.  Describes Moderate discomfort at times Severe. Abdomen is not tender , soft , no bloating or distention.. Last BM last night and normal  Reviewed Health History In EMR: Yes  Reviewed Medications In EMR: Yes  Reviewed Allergies In EMR: Yes  Reviewed Surgeries / Procedures: Yes  Date of Onset of Symptoms: 02/18/2013  Guideline(s) Used:  Abdominal Pain - Male  Disposition Per Guideline:   See Today in Office  Reason For Disposition Reached:   Age > 60 years  Advice Given:  Rest:  Lie down and rest until you feel better.  Fluids:  Sip clear fluids only (e.g., water, flat soft drinks or half-strength fruit juice) until the pain has been gone for over 2 hours. Then slowly return to a regular diet.  Diet:  Slowly advance diet from clear liquids to a bland diet  Avoid alcohol or caffeinated beverages  Avoid greasy or fatty foods  Pass A BM:  Sit on the toilet and try to pass a bowel movement (BM). Do not strain. This may relieve pain if it is due to constipation or impending diarrhea.  Avoid NSAIDS and  Aspirin  : Avoid any drug that can irritate the stomach lining and make the pain worse (especially aspirin and NSAIDs like ibuprofen).  Call Back If:  Abdominal pain is constant and present for more than 2 hours  Abdominal pains come and go and are present for more than 24 hours  You become worse.  Patient Will Follow Care Advice:  YES  Appointment Scheduled:  02/24/2013 16:00:00 Appointment Scheduled Provider:  Lelon Perla.

## 2013-02-27 ENCOUNTER — Ambulatory Visit (INDEPENDENT_AMBULATORY_CARE_PROVIDER_SITE_OTHER): Payer: BC Managed Care – PPO | Admitting: General Surgery

## 2013-02-27 ENCOUNTER — Encounter (INDEPENDENT_AMBULATORY_CARE_PROVIDER_SITE_OTHER): Payer: Self-pay | Admitting: General Surgery

## 2013-02-27 ENCOUNTER — Ambulatory Visit: Payer: BC Managed Care – PPO | Admitting: Cardiology

## 2013-02-27 VITALS — BP 110/68 | HR 68 | Temp 98.8°F | Resp 14 | Ht 77.0 in | Wt 223.6 lb

## 2013-02-27 DIAGNOSIS — K429 Umbilical hernia without obstruction or gangrene: Secondary | ICD-10-CM

## 2013-02-27 DIAGNOSIS — K4090 Unilateral inguinal hernia, without obstruction or gangrene, not specified as recurrent: Secondary | ICD-10-CM

## 2013-02-27 NOTE — Progress Notes (Signed)
Patient ID: Jeff [name], male   DOB: 10-Apr-1951, 62 y.o.   MRN: [id number]  Chief Complaint  Patient presents with  . New Evaluation    eval RIH    HPI Jeff [name] is a 62 y.o. male.  This patient is referred by Dr. Laury Axon for evaluation of a symptomatic right inguinal hernia. He was in his usual state of health until about one week ago when he began having some right-sided groin pain. He describes this as a cramping feeling and then last Friday when he bent over to reach for an object he felt a "tearing" pain in his right groin. He says that the pain is worse with standing but it is relieved with sitting or lying down. He has noticed a slight bulge in the area of but denies any obstructive symptoms such as nausea and vomiting. He says that his bowels are normal and he is currently on his colonoscopy. He denies any cardiac problems although he recently was referred for cardiac evaluation for a heart murmur and he tells me that the workup was uneventful from this.  HPI  Past Medical History  Diagnosis Date  . Hyperlipidemia   . Hypertrophic obstructive cardiomyopathy   . Atrial fibrillation 09/2008    hx of  . Atrial flutter 09/2008    hx of  . Psoriasis   . Allergic rhinitis   . Parkinsonism   . Heart murmur     Past Surgical History  Procedure Laterality Date  . Tonsillectomy    . Colonoscopy    . Polypectomy      Family History  Problem Relation Age of Onset  . Hypertension Neg Hx   . Stroke Neg Hx   . Colon cancer Neg Hx   . Prostate cancer Neg Hx   . Heart murmur Brother   . Alzheimer's disease Father   . Diabetes Father   . Heart disease Father     Social History History  Substance Use Topics  . Smoking status: Former Smoker    Types: Cigarettes    Quit date: 07/27/1970  . Smokeless tobacco: Never Used  . Alcohol Use: 1.5 oz/week    3 drink(s) per week     Comment: 2 beers a week (at most), 1 vodka every other week    No Known Allergies  Current  Outpatient Prescriptions  Medication Sig Dispense Refill  . amoxicillin (AMOXIL) 500 MG capsule Take 500 mg by mouth 3 (three) times daily.      Marland Kitchen aspirin EC 325 MG tablet Take 325 mg by mouth daily.        . carbidopa-levodopa (SINEMET IR) 25-100 MG per tablet Take 2 tablets by mouth 3 (three) times daily.  540 tablet  1  . clobetasol cream (TEMOVATE) 0.05 % Apply topically 2 (two) times daily as needed.  30 g  1  . disopyramide (NORPACE) 150 MG capsule Take 1 capsule (150 mg total) by mouth 2 (two) times daily.  30 capsule  0  . metoprolol succinate (TOPROL-XL) 25 MG 24 hr tablet TAKE 1 TABLET BY MOUTH DAILY  90 tablet  3  . pramipexole (MIRAPEX) 0.5 MG tablet Take 1 tablet (0.5 mg total) by mouth 3 (three) times daily.  270 tablet  1  . simvastatin (ZOCOR) 40 MG tablet TAKE 1 TABLET BY MOUTH DAILY AT BEDTIME** LABS OVERDUE**  30 tablet  1  . vitamin B-12 (CYANOCOBALAMIN) 1000 MCG tablet Take 1,000 mcg by mouth daily.  No current facility-administered medications for this visit.    Review of Systems Review of Systems All other review of systems negative or noncontributory except as stated in the HPI  Blood pressure 110/68, pulse 68, temperature 98.8 F (37.1 C), temperature source Temporal, resp. rate 14, height 6\' 5"  (1.956 m), weight 223 lb 9.6 oz (101.424 kg).  Physical Exam Physical Exam Physical Exam  Vitals reviewed. Constitutional: He is oriented to person, place, and time. He appears well-developed and well-nourished. No distress.  HENT:  Head: Normocephalic and atraumatic.  Mouth/Throat: No oropharyngeal exudate.  Eyes: Conjunctivae and EOM are normal. Pupils are equal, round, and reactive to light. Right eye exhibits no discharge. Left eye exhibits no discharge. No scleral icterus.  Neck: Normal range of motion. No tracheal deviation present.  Cardiovascular: Normal rate, regular rhythm , he does have a heart murmur   Pulmonary/Chest: Effort normal and breath sounds  normal. No stridor. No respiratory distress. He has no wheezes. He has no rales. He exhibits no tenderness.  Abdominal: Soft. Bowel sounds are normal. He exhibits no distension and no mass. There is no tenderness. There is no rebound and no guarding. small umbilical hernia.  Reducible RIH, no LIH Musculoskeletal: Normal range of motion. He exhibits no edema and no tenderness.  Neurological: He is alert and oriented to person, place, and time.  Skin: Skin is warm and dry. No rash noted. He is not diaphoretic. No erythema. No pallor.  Psychiatric: He has a normal mood and affect. His behavior is normal. Judgment and thought content normal.    Data Reviewed   Assessment    Reducible right inguinal hernia-symptomatic Umbilical hernia-asymptomatic  He does have a reducible but symptomatic right inguinal hernia on exam as well as a small asymptomatic umbilical hernia. I discussed with him the options of watchful waiting versus inguinal hernia repair both open and laparoscopic. We discussed the pros and cons of each option and he would like to proceed with laparoscopic right inguinal hernia repair with mesh and umbilical hernia repair. Because he has the umbilical hernia as well I think that this is a good option for him as we can repair both hernias through the same incisions. I did discuss with him the procedure and its risks including the risks of infection, bleeding, pain, scarring, recurrence, nerve injury and chronic pain, need for open surgery, and bowel injury and he expressed understanding and would like to proceed with laparoscopic right inguinal hernia repair with mesh and umbilical hernia repair    Plan    We will set him up for laparoscopic right inguinal hernia repair with mesh and umbilical hernia repair when convenient        Rockwell Zentz DAVID 02/27/2013, 10:56 AM

## 2013-03-03 ENCOUNTER — Telehealth: Payer: Self-pay | Admitting: *Deleted

## 2013-03-03 NOTE — Telephone Encounter (Signed)
Mr. Brandt called and said that he saw Dr. Myles Lipps regarding his hernia removal and he will not be able to do the surgery until November or December. The patient would like to be referred to a different surgeon if possible to get the surgery done sooner. 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

## 2013-03-08 ENCOUNTER — Other Ambulatory Visit: Payer: Self-pay | Admitting: Internal Medicine

## 2013-03-09 ENCOUNTER — Telehealth: Payer: Self-pay | Admitting: Hematology & Oncology

## 2013-03-09 NOTE — Telephone Encounter (Signed)
rx refilled per protocol. DJR  

## 2013-03-09 NOTE — Telephone Encounter (Signed)
Patient called and cx 05/15/13 apt and stated he would call back to resch

## 2013-03-09 NOTE — Telephone Encounter (Signed)
Patient called back and sch apt for 04/22/13

## 2013-03-13 ENCOUNTER — Encounter: Payer: Self-pay | Admitting: Cardiology

## 2013-03-13 ENCOUNTER — Ambulatory Visit (INDEPENDENT_AMBULATORY_CARE_PROVIDER_SITE_OTHER): Payer: BC Managed Care – PPO | Admitting: Cardiology

## 2013-03-13 VITALS — BP 124/84 | HR 62 | Ht 76.0 in | Wt 226.0 lb

## 2013-03-13 DIAGNOSIS — I421 Obstructive hypertrophic cardiomyopathy: Secondary | ICD-10-CM

## 2013-03-13 DIAGNOSIS — I4891 Unspecified atrial fibrillation: Secondary | ICD-10-CM

## 2013-03-13 NOTE — Assessment & Plan Note (Signed)
Patient apparently has had documented atrial fibrillation and flutter in the distant past. He has no embolic risk factors. His risk of embolic event is most likely increased because of hypertrophic retinopathy. However given CHADS score 0, will treat with ASA. Continue disopyramide.

## 2013-03-13 NOTE — Assessment & Plan Note (Signed)
Plan continue beta blocker. He is asymptomatic. He does not have risk factors for sudden death including no family history of sudden death and no history of syncope. Plan repeat echocardiogram. Arrange exercise treadmill to evaluate systolic blood pressure with exercise. Schedule 48 hour Holter monitor to rule out nonsustained ventricular tachycardia. Patient has been instructed that his siblings need to be screened. He has no children.

## 2013-03-13 NOTE — Patient Instructions (Signed)
Your physician has requested that you have an exercise tolerance test. Please also follow instruction sheet, as given.  Your physician has recommended that you wear an 48HR event monitor. Event monitors are medical devices that record the heart's electrical activity. Doctors most often [place] these monitors to diagnose arrhythmias. Arrhythmias are problems with the speed or rhythm of the heartbeat. The monitor is a small, portable device. You can wear one while you do your normal daily activities. This is usually used to diagnose what is causing palpitations/syncope (passing out).  Your physician has requested that you have an echocardiogram. Echocardiography is a painless test that uses sound waves to create images of your heart. It provides your doctor with information about the size and shape of your heart and how well your heart's chambers and valves are working. This procedure takes approximately one hour. There are no restrictions for this procedure.  Your physician wants you to follow-up in: 1 YEAR WITH DR. CRENSHAW You will receive a reminder letter in the mail two months in advance. If you don't receive a letter, please call our office to schedule the follow-up appointment.

## 2013-03-13 NOTE — Progress Notes (Signed)
HPI: 62 year old male previously followed by Dr. Daleen Squibb for followup of hypertrophic obstructive cardiomyopathy and atrial fibrillation. Last echocardiogram in June of 2010 showed moderate to severe left ventricular hypertrophy and normal LV function. There is now flow gradient of 100 mm of mercury. There was mild aortic and mitral regurgitation. There was moderate left atrial enlargement. Renal Dopplers in September 2014 normal. Patient also apparently found to have atrial fibrillation on previous monitor. Since he was last seen there is no dyspnea on exertion, orthopnea, PND, pedal edema, palpitations, syncope or chest pain.  Current Outpatient Prescriptions  Medication Sig Dispense Refill  . amoxicillin (AMOXIL) 500 MG capsule Take 500 mg by mouth 3 (three) times daily.      Marland Kitchen aspirin EC 325 MG tablet Take 325 mg by mouth daily.        . carbidopa-levodopa (SINEMET IR) 25-100 MG per tablet Take 2 tablets by mouth 3 (three) times daily.  540 tablet  1  . clobetasol cream (TEMOVATE) 0.05 % Apply topically 2 (two) times daily as needed.  30 g  1  . disopyramide (NORPACE) 150 MG capsule Take 1 capsule (150 mg total) by mouth 2 (two) times daily.  30 capsule  0  . metoprolol succinate (TOPROL-XL) 25 MG 24 hr tablet TAKE 1 TABLET BY MOUTH DAILY  90 tablet  3  . pramipexole (MIRAPEX) 0.5 MG tablet Take 1 tablet (0.5 mg total) by mouth 3 (three) times daily.  270 tablet  1  . simvastatin (ZOCOR) 40 MG tablet TAKE 1 TABLET BY MOUTH DAILY AT BEDTIME  30 tablet  5  . vitamin B-12 (CYANOCOBALAMIN) 1000 MCG tablet Take 1,000 mcg by mouth daily.       No current facility-administered medications for this visit.     Past Medical History  Diagnosis Date  . Hyperlipidemia   . Hypertrophic obstructive cardiomyopathy   . Atrial fibrillation 09/2008    hx of  . Atrial flutter 09/2008    hx of  . Psoriasis   . Allergic rhinitis   . Parkinsonism   . Heart murmur     Past Surgical History    Procedure Laterality Date  . Tonsillectomy    . Colonoscopy    . Polypectomy      History   Social History  . Marital Status: Married    Spouse Name: N/A    Number of Children: 0  . Years of Education: N/A   Occupational History  . [employer] professor   Social History Main Topics  . Smoking status: Former Smoker    Types: Cigarettes    Quit date: 07/27/1970  . Smokeless tobacco: Never Used  . Alcohol Use: 1.5 oz/week    3 drink(s) per week     Comment: 2 beers a week (at most), 1 vodka every other week  . Drug Use: Yes     Comment: Smokes marijuana once a week  . Sexual Activity: Not on file   Other Topics Concern  . Not on file   Social History Narrative  . No narrative on file    ROS: no fevers or chills, productive cough, hemoptysis, dysphasia, odynophagia, melena, hematochezia, dysuria, hematuria, rash, seizure activity, orthopnea, PND, pedal edema, claudication. Remaining systems are negative.  Physical Exam: Well-developed well-nourished in no acute distress.  Skin is warm and dry.  HEENT is normal.  Neck is supple.  Chest is clear to auscultation with  normal expansion.  Cardiovascular exam is regular rate and rhythm. 3/6 systolic murmur left sternal border Abdominal exam nontender or distended. No masses palpated. Extremities show no edema. neuro grossly intact  ECG sinus rhythm with first degree AV block. Probable limb lead reversal. Left bundle branch block.

## 2013-03-16 ENCOUNTER — Telehealth: Payer: Self-pay | Admitting: Hematology & Oncology

## 2013-03-16 ENCOUNTER — Telehealth: Payer: Self-pay | Admitting: *Deleted

## 2013-03-16 ENCOUNTER — Ambulatory Visit: Payer: BC Managed Care – PPO | Admitting: Cardiology

## 2013-03-16 NOTE — Telephone Encounter (Signed)
Spoke with pt, received a message through my chart the pt had a question regarding swelling in his right foot. He reports there is only a small amount. The right side is the side that is effected by tremors from his parkinson's. Explained to pt if related to his heart usually will be general not localized. He agreed and is going to talk with his parkinson's doctor about the edema.

## 2013-03-16 NOTE — Telephone Encounter (Signed)
Pt moved 11-26 to 2-6 left RN message

## 2013-03-27 ENCOUNTER — Ambulatory Visit (HOSPITAL_COMMUNITY): Payer: BC Managed Care – PPO | Attending: Internal Medicine

## 2013-03-27 DIAGNOSIS — I4891 Unspecified atrial fibrillation: Secondary | ICD-10-CM | POA: Insufficient documentation

## 2013-03-27 DIAGNOSIS — I379 Nonrheumatic pulmonary valve disorder, unspecified: Secondary | ICD-10-CM | POA: Insufficient documentation

## 2013-03-27 DIAGNOSIS — I059 Rheumatic mitral valve disease, unspecified: Secondary | ICD-10-CM | POA: Insufficient documentation

## 2013-03-27 DIAGNOSIS — E785 Hyperlipidemia, unspecified: Secondary | ICD-10-CM | POA: Insufficient documentation

## 2013-03-27 DIAGNOSIS — G20 Parkinson's disease: Secondary | ICD-10-CM | POA: Insufficient documentation

## 2013-03-27 DIAGNOSIS — Z87891 Personal history of nicotine dependence: Secondary | ICD-10-CM | POA: Insufficient documentation

## 2013-03-27 DIAGNOSIS — I079 Rheumatic tricuspid valve disease, unspecified: Secondary | ICD-10-CM | POA: Insufficient documentation

## 2013-03-27 DIAGNOSIS — I359 Nonrheumatic aortic valve disorder, unspecified: Secondary | ICD-10-CM | POA: Insufficient documentation

## 2013-03-27 DIAGNOSIS — I421 Obstructive hypertrophic cardiomyopathy: Secondary | ICD-10-CM | POA: Insufficient documentation

## 2013-03-27 DIAGNOSIS — G20A1 Parkinson's disease without dyskinesia, without mention of fluctuations: Secondary | ICD-10-CM | POA: Insufficient documentation

## 2013-03-27 NOTE — Progress Notes (Signed)
Echocardiogram performed.  

## 2013-03-30 ENCOUNTER — Telehealth: Payer: Self-pay | Admitting: Cardiology

## 2013-03-30 NOTE — Telephone Encounter (Signed)
New problem    Pt calling for results on Echo in Oct. 31st

## 2013-03-30 NOTE — Telephone Encounter (Signed)
Reviewed echo results with patient who verbalized understanding.    

## 2013-03-31 ENCOUNTER — Encounter: Payer: Self-pay | Admitting: *Deleted

## 2013-03-31 ENCOUNTER — Encounter (INDEPENDENT_AMBULATORY_CARE_PROVIDER_SITE_OTHER): Payer: BC Managed Care – PPO

## 2013-03-31 DIAGNOSIS — I421 Obstructive hypertrophic cardiomyopathy: Secondary | ICD-10-CM

## 2013-03-31 DIAGNOSIS — I4891 Unspecified atrial fibrillation: Secondary | ICD-10-CM

## 2013-03-31 NOTE — Progress Notes (Signed)
Patient ID: Jeff [name], male   DOB: 1950/10/03, 62 y.o.   MRN: [id number] E-Cardio 48 hour holter monitor applied to patient.

## 2013-03-31 NOTE — Addendum Note (Signed)
Addended by: Freddi Starr on: 03/31/2013 12:50 PM   Modules accepted: Orders

## 2013-04-06 ENCOUNTER — Ambulatory Visit (INDEPENDENT_AMBULATORY_CARE_PROVIDER_SITE_OTHER): Payer: BC Managed Care – PPO | Admitting: Surgery

## 2013-04-06 ENCOUNTER — Encounter (INDEPENDENT_AMBULATORY_CARE_PROVIDER_SITE_OTHER): Payer: Self-pay | Admitting: Surgery

## 2013-04-06 VITALS — BP 122/90 | HR 64 | Temp 99.0°F | Resp 14 | Ht 77.0 in | Wt 224.8 lb

## 2013-04-06 DIAGNOSIS — K429 Umbilical hernia without obstruction or gangrene: Secondary | ICD-10-CM

## 2013-04-06 DIAGNOSIS — K4090 Unilateral inguinal hernia, without obstruction or gangrene, not specified as recurrent: Secondary | ICD-10-CM

## 2013-04-06 NOTE — Progress Notes (Signed)
Patient ID: Jeff [name], male   DOB: [birth year], 62 y.o.   MRN: [id number]   He is just here today to discuss the surgery. Again I discussed the laparoscopic right inguinal hernia repair with mesh as well as umbilical hernia repair and potential for left inguinal hernia she will be found. Surgery is already scheduled. His questions were answered

## 2013-04-09 ENCOUNTER — Telehealth: Payer: Self-pay | Admitting: *Deleted

## 2013-04-09 NOTE — Telephone Encounter (Signed)
Spoke with pt, aware monitor reviewed by dr Jens Som shows sinus with breif SVT, PVC and couplet. One run, 3 beats NSVT.

## 2013-04-19 ENCOUNTER — Other Ambulatory Visit: Payer: Self-pay | Admitting: Cardiology

## 2013-04-22 ENCOUNTER — Ambulatory Visit (INDEPENDENT_AMBULATORY_CARE_PROVIDER_SITE_OTHER): Payer: BC Managed Care – PPO | Admitting: Nurse Practitioner

## 2013-04-22 ENCOUNTER — Other Ambulatory Visit: Payer: BC Managed Care – PPO | Admitting: Lab

## 2013-04-22 ENCOUNTER — Ambulatory Visit: Payer: BC Managed Care – PPO | Admitting: Hematology & Oncology

## 2013-04-22 ENCOUNTER — Encounter: Payer: Self-pay | Admitting: Nurse Practitioner

## 2013-04-22 VITALS — BP 144/91 | HR 74

## 2013-04-22 DIAGNOSIS — I421 Obstructive hypertrophic cardiomyopathy: Secondary | ICD-10-CM

## 2013-04-22 DIAGNOSIS — I4891 Unspecified atrial fibrillation: Secondary | ICD-10-CM

## 2013-04-22 NOTE — Progress Notes (Signed)
Exercise Treadmill Test  Pre-Exercise Testing Evaluation Rhythm: normal sinus  Rate: 60 bpm     Test  Exercise Tolerance Test Ordering MD: Olga Millers, MD  Interpreting MD: Norma Fredrickson, NP  Unique Test No: 1  Treadmill:  1  Indication for ETT: afib/cardiomyopathy  Contraindication to ETT: No   Stress Modality: exercise - treadmill  Cardiac Imaging Performed: non   Protocol: standard Bruce - maximal  Max BP:  213/106  Max MPHR (bpm):  157 85% MPR (bpm):  133  MPHR obtained (bpm):  134 % MPHR obtained:  85%  Reached 85% MPHR (min:sec):  9 minutes Total Exercise Time (min-sec):  9 minutes  Workload in METS:  10.4 Borg Scale: 17  Reason ETT Terminated:  patient's desire to stop    ST Segment Analysis At Rest: EKG non-diagnostic - has resting LBBB With Exercise: EKG non-diagnostic - has resting LBBB  Other Information Arrhythmia:  Yes - occasional PVCs. No significant arrhythmia Angina during ETT:  absent (0) Quality of ETT:  non-diagnostic  ETT Interpretation:  Non-diagnostic - patient has LBBB  Comments: Patient presents today for routine GXT. Has upcoming hernia surgery in mid December. Has a hypertrophic CM and PAF. Currently in sinus. Study was to evaluate BP response with exercise. Has LBBB. Patient did take his Toprol this morning. BP at beginning of study was 144/91.   Today the patient exercised on the standard Bruce protocol for a total of 9 minutes.  Good exercise tolerance.  Mildly hypertensive blood pressure response with max BP 213/106.  Clinically negative for chest pain. Slight headache at peak of exercise - resolved in recovery. Test was stopped due to achievement of target HR.  EKG Non-diagnostic. No significant arrhythmia noted but noted to have occasional PVCs.   Recommendations: Will have Dr. Jens Som review.   Patient is agreeable to this plan and will call if any problems develop in the interim.   Rosalio Macadamia, RN, ANP-C [facility] [address] [place], [place]  [address]

## 2013-04-28 ENCOUNTER — Encounter (HOSPITAL_COMMUNITY): Payer: Self-pay | Admitting: Pharmacy Technician

## 2013-05-08 ENCOUNTER — Other Ambulatory Visit (INDEPENDENT_AMBULATORY_CARE_PROVIDER_SITE_OTHER): Payer: Self-pay | Admitting: Surgery

## 2013-05-08 NOTE — Pre-Procedure Instructions (Signed)
Jeff [name]  05/08/2013   Your procedure is scheduled on:  05-14-2013  Thursday   Report to [facility]  2 * 3 at 5:30 AM.   Call this number if you have problems the morning of surgery: [phone]   Remember:   Do not eat food or drink liquids after midnight.    Take these medicines the morning of surgery with A SIP OF WATER: carbidopa-levodopa(Sinemet IR),disopyramide(Norpace),metoprolol(Topro-XL)             STOP ALL ASPIRIN,NSAIDS,BLOOD THINNERS AND HERBAL MEDICATIONS   Do not wear jewelry  Do not wear lotions, powders, or perfumes.   Do not shave 48 hours prior to surgery. Men may shave face and neck.  Do not bring valuables to the hospital.  [facility] is not responsible for any belongings or valuables.               Contacts, dentures or bridgework may not be worn into surgery.  Leave suitcase in the car. After surgery it may be brought to your room.  For patients admitted to the hospital, discharge time is determined by your treatment team.               Patients discharged the day of surgery will not be allowed to drive home.   Name and phone number of your driver:    Special Instructions: Shower using CHG 2 nights before surgery and the night before surgery.  If you shower the day of surgery use CHG.  Use special wash - you have one bottle of CHG for all showers.  You should use approximately 1/3 of the bottle for each shower.   Please read over the following fact sheets that you were given: Pain Booklet, Coughing and Deep Breathing and Surgical Site Infection Prevention

## 2013-05-11 ENCOUNTER — Encounter (HOSPITAL_COMMUNITY)
Admission: RE | Admit: 2013-05-11 | Discharge: 2013-05-11 | Disposition: A | Discharge status: Home / Self Care | Payer: BC Managed Care – PPO | Source: Ambulatory Visit | Attending: Surgery | Admitting: Surgery

## 2013-05-11 ENCOUNTER — Encounter (HOSPITAL_COMMUNITY): Payer: Self-pay

## 2013-05-11 ENCOUNTER — Encounter (HOSPITAL_COMMUNITY)
Admission: RE | Admit: 2013-05-11 | Discharge: 2013-05-11 | Disposition: A | Discharge status: Home / Self Care | Payer: BC Managed Care – PPO | Source: Ambulatory Visit | Attending: Anesthesiology | Admitting: Anesthesiology

## 2013-05-11 HISTORY — DX: Insomnia, unspecified: G47.00

## 2013-05-11 HISTORY — DX: Benign prostatic hyperplasia without lower urinary tract symptoms: N40.0

## 2013-05-11 HISTORY — DX: Localized edema: R60.0

## 2013-05-11 HISTORY — DX: Edema, unspecified: R60.9

## 2013-05-11 HISTORY — DX: Unspecified cataract: H26.9

## 2013-05-11 HISTORY — DX: Unspecified hemorrhoids: K64.9

## 2013-05-11 LAB — CBC
HCT: 44 % (ref 39.0–52.0)
MCH: 31 pg (ref 26.0–34.0)
MCV: 89.2 fL (ref 78.0–100.0)
Platelets: 144 10*3/uL — ABNORMAL LOW (ref 150–400)
RBC: 4.93 MIL/uL (ref 4.22–5.81)
WBC: 6.2 10*3/uL (ref 4.0–10.5)

## 2013-05-11 LAB — BASIC METABOLIC PANEL
BUN: 13 mg/dL (ref 6–23)
CO2: 27 mEq/L (ref 19–32)
Chloride: 103 mEq/L (ref 96–112)
Potassium: 4.2 mEq/L (ref 3.5–5.1)
Sodium: 140 mEq/L (ref 135–145)

## 2013-05-11 NOTE — Progress Notes (Addendum)
Office visit with Dr.Crenshaw in epic from 03-13-13  Stress test done 2 wks ago and to be requested from Dr.Crenshaw  Echo reports in epic from 2010 and 03-27-13  Denies ever having a heart cath   Denies CXR in past yr  Medical Md is Dr.Paz  EKG in epic from 03-13-13

## 2013-05-12 ENCOUNTER — Encounter: Payer: Self-pay | Admitting: Neurology

## 2013-05-12 ENCOUNTER — Ambulatory Visit (INDEPENDENT_AMBULATORY_CARE_PROVIDER_SITE_OTHER): Payer: BC Managed Care – PPO | Admitting: Neurology

## 2013-05-12 VITALS — BP 90/60 | HR 68 | Temp 98.0°F | Resp 12 | Ht 77.0 in | Wt 227.5 lb

## 2013-05-12 DIAGNOSIS — G20 Parkinson's disease: Secondary | ICD-10-CM

## 2013-05-12 DIAGNOSIS — R202 Paresthesia of skin: Secondary | ICD-10-CM

## 2013-05-12 DIAGNOSIS — R209 Unspecified disturbances of skin sensation: Secondary | ICD-10-CM

## 2013-05-12 DIAGNOSIS — E538 Deficiency of other specified B group vitamins: Secondary | ICD-10-CM

## 2013-05-12 NOTE — Progress Notes (Signed)
Anesthesia Chart Review:  Patient is a 62 year old male scheduled for laparoscopic right inguinal hernia repair on 05/14/13 by Dr. Magnus Ivan.  History includes former smoker, HLD, hypertrophic obstructive cardiomyopathy, psoriasis, aflutter/fib, BPH, cataract, Pakinsonism, cataracts,  tonsillectomy, former smoker. 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 He is followed by Dr. 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  PCP is Dr. [name].  Neurologist is Dr. Lurena Joiner Tat. Cardiologist is Dr. Olga Millers, last visit 04/13/13. Patient was asymptomatic of his hypertrophic obstructive cardiomyopathy at that time.  Follow-up echo, ETT to evaluate systolic BP with exercise, and 48 hour holter monitor to rule out non-sustained VT.    He had a non-diagnostic ETT - patient has left BBB on 04/22/13.  Today the patient exercised on the standard Bruce protocol for a total of 9 minutes. Good exercise tolerance. Mildly hypertensive blood pressure response with max BP 213/106. Clinically negative for chest pain. Slight headache at peak of exercise - resolved in recovery. Test was stopped due to achievement of target HR. EKG Non-diagnostic. No significant arrhythmia noted but noted to have occasional PVCs.   Holter monitor read on 04/09/13 showed SR with PVCs, couplet, and one run of 36 beats of NSVT.  Echo on 03/27/13 showed: - Left ventricle: LV is severely thickened. LVOT is narrow There is turbulent flow through LVOT with peak and mean velocities of 75 and 37 mm Hg respectively consistent with IHSS. The cavity size was normal. Systolic function was normal. The estimated ejection fraction was in the range of 50% to 55%. Features are consistent with a pseudonormal left ventricular filling pattern, with concomitant abnormal relaxation and increased filling pressure (grade 2 diastolic dysfunction). - Aortic valve: Aortic preclosure present. Mild to moderate AI. Mild to moderate  regurgitation. - Mitral valve: Mild to moderate regurgitation. - Left atrium: The atrium was severely dilated. - Pulmonic valve: Mild regurgitation. - Tricuspid valve: Mild regurgitation.  EKG at [facility] on 03/13/13 noted and reviewed by Dr. Jens Som.  It showed NSR with first degree AVB, left BBB, probable limb lead reversal (right superior axis deviation).  CXR on 05/11/13 showed: No active cardiopulmonary disease. Stable cardiomegaly.  Preoperative labs noted. PLT count is 144K.  Above with most recent cardiology note and test reviewed with anesthesiologist Dr. Katrinka Blazing.  If no acute changes then anticipate that he can proceed as planned.  Velna Ochs [facility]/Anesthesiology Phone [phone] 05/12/2013 4:20 PM

## 2013-05-12 NOTE — Progress Notes (Signed)
Jeff [name] was seen today in the movement disorders clinic for neurologic f/u for PD.  His wife reports that tremor began intermittently 10 years ago but PD was dx by Dr. Modesto Charon on 07/27/11.  The patient stated that he quit using the laser pointer in the R hand over the last year b/c of tremor.  His wife would note that tremor would occur at rest.  Tremor does not interfere with guitar playing and he does not notice if it he is using the Borin.  He has noted that his R foot feels odd and stiff.  It is not painful.  11/24/2012 update:   He is on carbidopa/levodopa 25/100, 2 tablets 3 times a day.  His Mirapex is 0.5 mg 3 times a day.  He states that he is doing very well, although he does not know if he has noticed a big difference with the addition of Mirapex.  He is noting that he has an ache in the R hand.  He is learning to play guitar and feels that there is a tightness in it.  The dexterity in it seems okay.  He keeps time with the R foot and if he does not, he will note that it moves on its own.  He has some cramping in the R great toe but is unsure if it is related to dosing.    05/12/13:  The pt reports that 3 months ago, he bent down to catch something that was falling and it caused a hernia.  He is scheduled for hernia repair on Thursday.  He c/o R foot paresthesias and the top of the foot is hurting now and the R foot feels swollen.  He is starting to have paresthesias across the L foot but it doesn't hurt like the right.  He is having trouble staying asleep.  He gets up at 3 am and cannot fall back asleep.  However, he is having EDS.  He almost fell asleep driving.  He is still taking B12 supplement for the B12 deficiency.  He is planning on having surgery on Thursday for a hernia)  Wearing off:  no  How long before next dose:  n/a Falls:   no N/V:  no Hallucinations:  no  visual distortions: no (some flashing lights, eye exam summer 2013 negative) N/V:  no Lightheaded:  no  Syncope:  no Dyskinesia:  no      PREVIOUS MEDICATIONS: Sinemet  ALLERGIES:  No Known Allergies  CURRENT MEDICATIONS:  Current Outpatient Prescriptions on File Prior to Visit  Medication Sig Dispense Refill  . amoxicillin (AMOXIL) 500 MG capsule Take 2,000 mg by mouth See admin instructions. Take 2 hours prior to dental work      . carbidopa-levodopa (SINEMET IR) 25-100 MG per tablet Take 2 tablets by mouth 3 (three) times daily.  540 tablet  1  . clobetasol cream (TEMOVATE) 0.05 % Apply 1 application topically 2 (two) times daily as needed (for skin irritation).      Marland Kitchen disopyramide (NORPACE) 150 MG capsule Take 1 capsule (150 mg total) by mouth 2 (two) times daily.  30 capsule  0  . metoprolol succinate (TOPROL-XL) 25 MG 24 hr tablet Take 25 mg by mouth daily.      . pramipexole (MIRAPEX) 0.5 MG tablet Take 1 tablet (0.5 mg total) by mouth 3 (three) times daily.  270 tablet  1  . simvastatin (ZOCOR) 40 MG tablet Take 40 mg by mouth daily.      Marland Kitchen  vitamin B-12 (CYANOCOBALAMIN) 1000 MCG tablet Take 1,000 mcg by mouth daily.      Marland Kitchen aspirin EC 325 MG tablet Take 325 mg by mouth daily.         No current facility-administered medications on file prior to visit.    PAST MEDICAL HISTORY:   Past Medical History  Diagnosis Date  . Hyperlipidemia     takes Simvastatin daily  . Hypertrophic obstructive cardiomyopathy     takes Amoxicillin prior to dental work and Metoprolol daily  . Psoriasis     uses Temovate cream as needed  . Allergic rhinitis   . Parkinsonism     takes Mirapex tid and Sinemet tid as well  . Heart murmur   . Atrial fibrillation 09/2008    hx of-takes Norpace daily  . Atrial flutter 09/2008    hx of  . Pneumonia     hx of-many years ago  . Numbness and tingling     both legs  . Peripheral edema     right foot-seeing Dr.Paz on Wed about this  . Hemorrhoids   . Enlarged prostate but doesn't take any meds  . Cataract     immature but not sure which eye  . Insomnia      but no meds required    PAST SURGICAL HISTORY:   Past Surgical History  Procedure Laterality Date  . Colonoscopy      x 2  . Polypectomy    . Tonsillectomy      as a child    SOCIAL HISTORY:   History   Social History  . Marital Status: Married    Spouse Name: N/A    Number of Children: 0  . Years of Education: N/A   Occupational History  . [employer] and [employer] professor   Social History Main Topics  . Smoking status: Former Smoker    Types: Cigarettes  . Smokeless tobacco: Never Used     Comment: quit smoking > 41yr  . Alcohol Use: 1.5 oz/week    3 drink(s) per week     Comment: beer a couple of times a week  . Drug Use: Yes     Comment: Smokes marijuana once a week  . Sexual Activity: Yes   Other Topics Concern  . Not on file   Social History Narrative  . No narrative on file    FAMILY HISTORY:   Family Status  Relation Status Death Age  . Father Deceased     AD, pneumonia  . Mother Alive     healthy  . Brother Alive     2, HIV positive  . Sister Alive     healthy    ROS:  A complete 10 system review of systems was obtained and was unremarkable apart from what is mentioned above.  PHYSICAL EXAMINATION:    VITALS:   Filed Vitals:   05/12/13 1347  BP: 90/60  Pulse: 68  Temp: 98 F (36.7 C)  Resp: 12  Height: 6\' 5"  (1.956 m)  Weight: 227 lb 8 oz (103.193 kg)    GEN:  The patient appears stated age and is in NAD. HEENT:  Normocephalic, atraumatic.  The mucous membranes are moist. The superficial temporal arteries are without ropiness or tenderness. CV:  RRR with 3/6 SEM Lungs:  CTAB Neck/HEME:  There are no carotid bruits bilaterally.  Neurological examination:  Orientation: The patient is alert and oriented x3. Fund of knowledge  is appropriate.  Recent and remote memory are intact.  Attention and concentration are normal.    Able to name objects and repeat phrases. Cranial nerves: There is good facial  symmetry. There is minor facial hypomimia.  There is left ptosis.  Pupils are equal round and reactive to light bilaterally. Fundoscopic exam reveals clear margins bilaterally. Extraocular muscles are intact. The visual fields are full to confrontational testing. The speech is fluent and clear. Soft palate rises symmetrically and there is no tongue deviation. Hearing is intact to conversational tone. Sensation: Sensation is intact to light and pinprick throughout (facial, trunk, extremities). Vibration is intact at the bilateral big toe. There is no extinction with double simultaneous stimulation. There is no sensory dermatomal level identified. Motor: Strength is 5/5 in the bilateral upper and lower extremities.   Shoulder shrug is equal and symmetric.  There is no pronator drift. Deep tendon reflexes: Deep tendon reflexes are 2+/4 at the bilateral biceps, triceps, brachioradialis, patella and achilles. Plantar responses are downgoing bilaterally.  Movement examination: Tone: There is normal tone in the upper and lower extremities today. Abnormal movements: There is no dyskinesia.  Very rare tremor was seen on the right. Coordination:  There is no decremation with RAM's, including with alternating supination and pronation, hand opening and closing, heel taps, finger taps and toe taps. Gait and Station: The patient has no difficulty arising out of a deep-seated chair without the use of the Rickel. The patient's stride length is normal today and arm swing is good.   The patient has a negative pull test.      LABS:  Lab Results  Component Value Date   WBC 6.2 05/11/2013   HGB 15.3 05/11/2013   HCT 44.0 05/11/2013   MCV 89.2 05/11/2013   PLT 144* 05/11/2013     Chemistry      Component Value Date/Time   NA 140 05/11/2013 1043   K 4.2 05/11/2013 1043   CL 103 05/11/2013 1043   CO2 27 05/11/2013 1043   BUN 13 05/11/2013 1043   CREATININE 1.00 05/11/2013 1043      Component Value Date/Time    CALCIUM 9.3 05/11/2013 1043   ALKPHOS 49 02/13/2013 1052   AST 36 02/13/2013 1052   ALT 19 02/13/2013 1052   BILITOT 0.9 02/13/2013 1052     Lab Results  Component Value Date   VITAMINB12 497 07/23/2012   Lab Results  Component Value Date   TSH 1.33 02/13/2013     ASSESSMENT/PLAN:  1.  Idiopathic, tremor predominant Parkinson's disease.  This is fairly early in onset, dx 07/2011.  This is evidenced by rigidity, tremor and minor bradykinesia.  He really has no postural instability.  I am seeing some minor motor fluctuations today, as evidenced by dyskinesia (very rare and minor).  -he will remain on his carbidopa/levodopa 25/100, 2 tablets 3 times a day.  - I am going to reduce his Mirapex to 0.5 mg, half a tablet 3 times per day because of daytime hypersomnolence.  We may need to reduce this further.  -Risks, benefits, side effects and alternative therapies were discussed.  The opportunity to ask questions was given and they were answered to the best of my ability.  The patient expressed understanding and willingness to follow the outlined treatment protocols.  -I encouraged CV exercise. 2.  B12 deficiency.    -he is on oral B12 supplementation 3.  feet paresthesias, right greater than left.  -He will have an EMG to  rule out neuropathy.  4.   L ptosis  -this is chronic and stable per pt. 5.  F/u 3-4 months.

## 2013-05-13 ENCOUNTER — Ambulatory Visit (HOSPITAL_BASED_OUTPATIENT_CLINIC_OR_DEPARTMENT_OTHER)
Admission: RE | Admit: 2013-05-13 | Discharge: 2013-05-13 | Disposition: A | Discharge status: Home / Self Care | Payer: BC Managed Care – PPO | Source: Ambulatory Visit | Attending: Internal Medicine | Admitting: Internal Medicine

## 2013-05-13 ENCOUNTER — Ambulatory Visit (INDEPENDENT_AMBULATORY_CARE_PROVIDER_SITE_OTHER): Payer: BC Managed Care – PPO | Admitting: Internal Medicine

## 2013-05-13 ENCOUNTER — Encounter: Payer: Self-pay | Admitting: Internal Medicine

## 2013-05-13 VITALS — BP 107/74 | HR 65 | Temp 98.0°F | Wt 230.0 lb

## 2013-05-13 DIAGNOSIS — R600 Localized edema: Secondary | ICD-10-CM

## 2013-05-13 DIAGNOSIS — R609 Edema, unspecified: Secondary | ICD-10-CM

## 2013-05-13 DIAGNOSIS — I83893 Varicose veins of bilateral lower extremities with other complications: Secondary | ICD-10-CM | POA: Insufficient documentation

## 2013-05-13 MED ORDER — CEFAZOLIN SODIUM-DEXTROSE 2-3 GM-% IV SOLR
2.0000 g | INTRAVENOUS | Status: AC
  Administered 2013-05-14: 2 g via INTRAVENOUS
  Filled 2013-05-13: qty 50

## 2013-05-13 NOTE — Patient Instructions (Addendum)
Will get a  ultrasound today to rule out a clot in the right leg. Please come back in 3 months, sooner if the swelling gets worse

## 2013-05-13 NOTE — Assessment & Plan Note (Addendum)
Right lower extremity edema first noted by the patient's surgeon ~ 02-2013, occasional has pain. Arterial exam is normal, he does have varicose veins. Plan: Ultrasound today to rule out DVT since  the patient has surgery tomorrow. If the ultrasound is negative, in the future will consider further eval--->  venous insufficiency?

## 2013-05-13 NOTE — Progress Notes (Signed)
Pre visit review using our clinic review tool, if applicable. No additional management support is needed unless otherwise documented below in the visit note. 

## 2013-05-13 NOTE — H&P (Signed)
Patient ID: ARYON NHAM, male DOB: [birth year], 62 y.o. MRN: [id number]  Chief Complaint   Patient presents with   .  New Evaluation     eval RIH   HPI  Jeff [name] is a 62 y.o. male. This patient is referred by Dr. Laury Axon for evaluation of a symptomatic right inguinal hernia. He was in his usual state of health until about one week ago when he began having some right-sided groin pain. He describes this as a cramping feeling and then last Friday when he bent over to reach for an object he felt a "tearing" pain in his right groin. He says that the pain is worse with standing but it is relieved with sitting or lying down. He has noticed a slight bulge in the area of but denies any obstructive symptoms such as nausea and vomiting. He says that his bowels are normal and he is currently on his colonoscopy. He denies any cardiac problems although he recently was referred for cardiac evaluation for a heart murmur and he tells me that the workup was uneventful from this.  HPI  Past Medical History   Diagnosis  Date   .  Hyperlipidemia    .  Hypertrophic obstructive cardiomyopathy    .  Atrial fibrillation  09/2008     hx of   .  Atrial flutter  09/2008     hx of   .  Psoriasis    .  Allergic rhinitis    .  Parkinsonism    .  Heart murmur     Past Surgical History   Procedure  Laterality  Date   .  Tonsillectomy     .  Colonoscopy     .  Polypectomy      Family History   Problem  Relation  Age of Onset   .  Hypertension  Neg Hx    .  Stroke  Neg Hx    .  Colon cancer  Neg Hx    .  Prostate cancer  Neg Hx    .  Heart murmur  Brother    .  Alzheimer's disease  Father    .  Diabetes  Father    .  Heart disease  Father    Social History  History   Substance Use Topics   .  Smoking status:  Former Smoker     Types:  Cigarettes     Quit date:  07/27/1970   .  Smokeless tobacco:  Never Used   .  Alcohol Use:  1.5 oz/week     3 drink(s) per week      Comment: 2 beers a week (at most),  1 vodka every other week   No Known Allergies  Current Outpatient Prescriptions   Medication  Sig  Dispense  Refill   .  amoxicillin (AMOXIL) 500 MG capsule  Take 500 mg by mouth 3 (three) times daily.     Marland Kitchen  aspirin EC 325 MG tablet  Take 325 mg by mouth daily.     .  carbidopa-levodopa (SINEMET IR) 25-100 MG per tablet  Take 2 tablets by mouth 3 (three) times daily.  540 tablet  1   .  clobetasol cream (TEMOVATE) 0.05 %  Apply topically 2 (two) times daily as needed.  30 g  1   .  disopyramide (NORPACE) 150 MG capsule  Take 1 capsule (150 mg total) by mouth 2 (two) times daily.  30 capsule  0   .  metoprolol succinate (TOPROL-XL) 25 MG 24 hr tablet  TAKE 1 TABLET BY MOUTH DAILY  90 tablet  3   .  pramipexole (MIRAPEX) 0.5 MG tablet  Take 1 tablet (0.5 mg total) by mouth 3 (three) times daily.  270 tablet  1   .  simvastatin (ZOCOR) 40 MG tablet  TAKE 1 TABLET BY MOUTH DAILY AT BEDTIME** LABS OVERDUE**  30 tablet  1   .  vitamin B-12 (CYANOCOBALAMIN) 1000 MCG tablet  Take 1,000 mcg by mouth daily.      No current facility-administered medications for this visit.   Review of Systems  Review of Systems  All other review of systems negative or noncontributory except as stated in the HPI  Blood pressure 110/68, pulse 68, temperature 98.8 F (37.1 C), temperature source Temporal, resp. rate 14, height 6\' 5"  (1.956 m), weight 223 lb 9.6 oz (101.424 kg).  Physical Exam  Physical Exam  Physical Exam  Vitals reviewed.  Constitutional: He is oriented to person, place, and time. He appears well-developed and well-nourished. No distress.  HENT:  Head: Normocephalic and atraumatic.  Mouth/Throat: No oropharyngeal exudate.  Eyes: Conjunctivae and EOM are normal. Pupils are equal, round, and reactive to light. Right eye exhibits no discharge. Left eye exhibits no discharge. No scleral icterus.  Neck: Normal range of motion. No tracheal deviation present.  Cardiovascular: Normal rate, regular rhythm ,  he does have a heart murmur  Pulmonary/Chest: Effort normal and breath sounds normal. No stridor. No respiratory distress. He has no wheezes. He has no rales. He exhibits no tenderness.  Abdominal: Soft. Bowel sounds are normal. He exhibits no distension and no mass. There is no tenderness. There is no rebound and no guarding. small umbilical hernia. Reducible RIH, no LIH Musculoskeletal: Normal range of motion. He exhibits no edema and no tenderness.  Neurological: He is alert and oriented to person, place, and time.  Skin: Skin is warm and dry. No rash noted. He is not diaphoretic. No erythema. No pallor.  Psychiatric: He has a normal mood and affect. His behavior is normal. Judgment and thought content normal.  Data Reviewed   Assessment  Reducible right inguinal hernia-symptomatic  Umbilical hernia-asymptomatic  He does have a reducible but symptomatic right inguinal hernia on exam as well as a small asymptomatic umbilical hernia. I discussed with him the options of watchful waiting versus inguinal hernia repair both open and laparoscopic. We discussed the pros and cons of each option and he would like to proceed with laparoscopic right inguinal hernia repair with mesh and umbilical hernia repair. Because he has the umbilical hernia as well I think that this is a good option for him as we can repair both hernias through the same incisions. I did discuss with him the procedure and its risks including the risks of infection, bleeding, pain, scarring, recurrence, nerve injury and chronic pain, need for open surgery, and bowel injury and he expressed understanding and would like to proceed with laparoscopic right inguinal hernia repair with mesh and umbilical hernia repair  Plan   We will set him up for laparoscopic right inguinal hernia repair with mesh and umbilical hernia repair with possible mesh

## 2013-05-13 NOTE — Progress Notes (Signed)
   Subjective:    Patient ID: Jeff [name], male    DOB: 12-08-1950, 62 y.o.   MRN: [id number]  HPI To have surgery tomorrow, concern about right foot swelling, noted for the first time by one of his surgeons 2 months ago. Since then, swelling is no worse. Very rarely has pain in the right foot. He does have feet  Paresthesias R>L, Neurology planning to do further investigation. Last B12 level normal.  Past Medical History  Diagnosis Date  . Hyperlipidemia     takes Simvastatin daily  . Hypertrophic obstructive cardiomyopathy     takes Amoxicillin prior to dental work and Metoprolol daily  . Psoriasis     uses Temovate cream as needed  . Allergic rhinitis   . Parkinsonism     takes Mirapex tid and Sinemet tid as well  . Heart murmur   . Atrial fibrillation 09/2008    hx of-takes Norpace daily  . Atrial flutter 09/2008    hx of  . Pneumonia     hx of-many years ago  . Numbness and tingling     both legs  . Peripheral edema     right foot-seeing Dr.Caitlynne Harbeck on Wed about this  . Hemorrhoids   . Enlarged prostate but doesn't take any meds  . Cataract     immature but not sure which eye  . Insomnia     but no meds required   Past Surgical History  Procedure Laterality Date  . Colonoscopy      x 2  . Polypectomy    . Tonsillectomy      as a child   Review of Systems Denies calf pain per se, no recent airplane trip or prolonged car trip. Denies any chest pain, shortness or breath or palpitations. Denies any Raynaud phenomena type of symptoms.    Objective:   Physical Exam  Musculoskeletal:       Legs:  BP 107/74  Pulse 65  Temp(Src) 98 F (36.7 C)  Wt 230 lb (104.327 kg)  SpO2 99% General -- alert, well-developed, NAD.   Extremities--  Normal pedal, femoral pulses. Good capillary refill in all the toes bilaterally, no skin lesions, no ulcers, no TTP. He does have varicose veins without phlebitis, more noticeable in the right calf. Calves are symmetric. See  graphic Neurologic--  alert & oriented X3. Speech normal, gait normal, strength normal in all extremities.  Psych-- Cognition and judgment appear intact. Cooperative with normal attention span and concentration. No anxious appearing , no depressed appearing.      Assessment & Plan:

## 2013-05-14 ENCOUNTER — Encounter (HOSPITAL_COMMUNITY): Payer: BC Managed Care – PPO | Admitting: Vascular Surgery

## 2013-05-14 ENCOUNTER — Encounter (HOSPITAL_COMMUNITY)
Admission: RE | Disposition: A | Discharge status: Home / Self Care | Payer: Self-pay | Source: Ambulatory Visit | Attending: Surgery

## 2013-05-14 ENCOUNTER — Encounter (HOSPITAL_COMMUNITY): Payer: Self-pay | Admitting: *Deleted

## 2013-05-14 ENCOUNTER — Ambulatory Visit: Payer: BC Managed Care – PPO | Admitting: Neurology

## 2013-05-14 ENCOUNTER — Ambulatory Visit (HOSPITAL_COMMUNITY)
Admission: RE | Admit: 2013-05-14 | Discharge: 2013-05-14 | Disposition: A | Discharge status: Home / Self Care | Payer: BC Managed Care – PPO | Source: Ambulatory Visit | Attending: Surgery | Admitting: Surgery

## 2013-05-14 ENCOUNTER — Ambulatory Visit (HOSPITAL_COMMUNITY): Payer: BC Managed Care – PPO | Admitting: Anesthesiology

## 2013-05-14 DIAGNOSIS — Z87891 Personal history of nicotine dependence: Secondary | ICD-10-CM | POA: Insufficient documentation

## 2013-05-14 DIAGNOSIS — G20A1 Parkinson's disease without dyskinesia, without mention of fluctuations: Secondary | ICD-10-CM | POA: Insufficient documentation

## 2013-05-14 DIAGNOSIS — I10 Essential (primary) hypertension: Secondary | ICD-10-CM | POA: Insufficient documentation

## 2013-05-14 DIAGNOSIS — K4020 Bilateral inguinal hernia, without obstruction or gangrene, not specified as recurrent: Secondary | ICD-10-CM | POA: Insufficient documentation

## 2013-05-14 DIAGNOSIS — G20 Parkinson's disease: Secondary | ICD-10-CM | POA: Insufficient documentation

## 2013-05-14 DIAGNOSIS — K429 Umbilical hernia without obstruction or gangrene: Secondary | ICD-10-CM | POA: Insufficient documentation

## 2013-05-14 DIAGNOSIS — I421 Obstructive hypertrophic cardiomyopathy: Secondary | ICD-10-CM | POA: Insufficient documentation

## 2013-05-14 HISTORY — PX: INSERTION OF MESH: SHX5868

## 2013-05-14 HISTORY — PX: INGUINAL HERNIA REPAIR: SHX194

## 2013-05-14 SURGERY — REPAIR, HERNIA, INGUINAL, LAPAROSCOPIC
Anesthesia: General | Laterality: Bilateral

## 2013-05-14 MED ORDER — LIDOCAINE HCL (CARDIAC) 20 MG/ML IV SOLN
INTRAVENOUS | Status: DC | PRN
  Administered 2013-05-14: 100 mg via INTRAVENOUS

## 2013-05-14 MED ORDER — ACETAMINOPHEN 325 MG PO TABS
ORAL_TABLET | ORAL | Status: AC
  Filled 2013-05-14: qty 1

## 2013-05-14 MED ORDER — OXYCODONE HCL 5 MG PO TABS
ORAL_TABLET | ORAL | Status: AC
  Filled 2013-05-14: qty 1

## 2013-05-14 MED ORDER — ONDANSETRON HCL 4 MG/2ML IJ SOLN
INTRAMUSCULAR | Status: DC | PRN
  Administered 2013-05-14: 4 mg via INTRAVENOUS

## 2013-05-14 MED ORDER — LACTATED RINGERS IV SOLN
INTRAVENOUS | Status: DC | PRN
  Administered 2013-05-14: 07:00:00 via INTRAVENOUS

## 2013-05-14 MED ORDER — BUPIVACAINE HCL (PF) 0.25 % IJ SOLN
INTRAMUSCULAR | Status: DC | PRN
  Administered 2013-05-14: 20 mL

## 2013-05-14 MED ORDER — ACETAMINOPHEN 650 MG RE SUPP
650.0000 mg | RECTAL | Status: DC | PRN
  Filled 2013-05-14: qty 1

## 2013-05-14 MED ORDER — OXYCODONE-ACETAMINOPHEN 5-325 MG PO TABS: 1.0000 | ORAL_TABLET | ORAL | Status: DC | PRN

## 2013-05-14 MED ORDER — OXYCODONE HCL 5 MG PO TABS
5.0000 mg | ORAL_TABLET | Freq: Once | ORAL | Status: AC | PRN
  Administered 2013-05-14: 5 mg via ORAL

## 2013-05-14 MED ORDER — FENTANYL CITRATE 0.05 MG/ML IJ SOLN
INTRAMUSCULAR | Status: DC | PRN
  Administered 2013-05-14 (×2): 50 ug via INTRAVENOUS

## 2013-05-14 MED ORDER — SODIUM CHLORIDE 0.9 % IJ SOLN
INTRAMUSCULAR | Status: AC
  Filled 2013-05-14: qty 3

## 2013-05-14 MED ORDER — OXYCODONE HCL 5 MG PO TABS: 5.0000 mg | ORAL_TABLET | ORAL | Status: DC | PRN

## 2013-05-14 MED ORDER — ACETAMINOPHEN 325 MG PO TABS
650.0000 mg | ORAL_TABLET | ORAL | Status: DC | PRN
  Filled 2013-05-14: qty 2

## 2013-05-14 MED ORDER — PROPOFOL 10 MG/ML IV BOLUS
INTRAVENOUS | Status: DC | PRN
  Administered 2013-05-14: 200 mg via INTRAVENOUS

## 2013-05-14 MED ORDER — BUPIVACAINE HCL (PF) 0.25 % IJ SOLN
INTRAMUSCULAR | Status: AC
  Filled 2013-05-14: qty 10

## 2013-05-14 MED ORDER — GLYCOPYRROLATE 0.2 MG/ML IJ SOLN
INTRAMUSCULAR | Status: DC | PRN
  Administered 2013-05-14: 0.4 mg via INTRAVENOUS

## 2013-05-14 MED ORDER — MORPHINE SULFATE 4 MG/ML IJ SOLN: 4.0000 mg | INTRAMUSCULAR | Status: DC | PRN

## 2013-05-14 MED ORDER — ROCURONIUM BROMIDE 100 MG/10ML IV SOLN
INTRAVENOUS | Status: DC | PRN
  Administered 2013-05-14: 40 mg via INTRAVENOUS

## 2013-05-14 MED ORDER — PHENYLEPHRINE HCL 10 MG/ML IJ SOLN
INTRAMUSCULAR | Status: DC | PRN
  Administered 2013-05-14: 40 ug via INTRAVENOUS
  Administered 2013-05-14: 80 ug via INTRAVENOUS
  Administered 2013-05-14 (×3): 40 ug via INTRAVENOUS

## 2013-05-14 MED ORDER — BUPIVACAINE HCL (PF) 0.25 % IJ SOLN
INTRAMUSCULAR | Status: AC
  Filled 2013-05-14: qty 30

## 2013-05-14 MED ORDER — SODIUM CHLORIDE 0.9 % IJ SOLN: 3.0000 mL | Freq: Two times a day (BID) | INTRAMUSCULAR | Status: DC

## 2013-05-14 MED ORDER — ONDANSETRON HCL 4 MG/2ML IJ SOLN
4.0000 mg | Freq: Four times a day (QID) | INTRAMUSCULAR | Status: DC | PRN
  Filled 2013-05-14: qty 2

## 2013-05-14 MED ORDER — NEOSTIGMINE METHYLSULFATE 1 MG/ML IJ SOLN
INTRAMUSCULAR | Status: DC | PRN
  Administered 2013-05-14: 3 mg via INTRAVENOUS

## 2013-05-14 MED ORDER — MIDAZOLAM HCL 5 MG/5ML IJ SOLN
INTRAMUSCULAR | Status: DC | PRN
  Administered 2013-05-14: 2 mg via INTRAVENOUS

## 2013-05-14 MED ORDER — SODIUM CHLORIDE 0.9 % IV SOLN: 250.0000 mL | INTRAVENOUS | Status: DC | PRN

## 2013-05-14 MED ORDER — HYDROMORPHONE HCL PF 1 MG/ML IJ SOLN
INTRAMUSCULAR | Status: AC
  Filled 2013-05-14: qty 1

## 2013-05-14 MED ORDER — SODIUM CHLORIDE 0.9 % IJ SOLN: 3.0000 mL | INTRAMUSCULAR | Status: DC | PRN

## 2013-05-14 MED ORDER — OXYCODONE HCL 5 MG/5ML PO SOLN: 5.0000 mg | Freq: Once | ORAL | Status: AC | PRN

## 2013-05-14 MED ORDER — HYDROMORPHONE HCL PF 1 MG/ML IJ SOLN
0.2500 mg | INTRAMUSCULAR | Status: DC | PRN
  Administered 2013-05-14: 0.5 mg via INTRAVENOUS

## 2013-05-14 MED ORDER — KETOROLAC TROMETHAMINE 30 MG/ML IJ SOLN
INTRAMUSCULAR | Status: DC | PRN
  Administered 2013-05-14: 30 mg via INTRAVENOUS

## 2013-05-14 SURGICAL SUPPLY — 56 items
APL SKNCLS STERI-STRIP NONHPOA (GAUZE/BANDAGES/DRESSINGS) ×2
APPLIER CLIP LOGIC TI 5 (MISCELLANEOUS) IMPLANT
APR CLP MED LRG 33X5 (MISCELLANEOUS)
BANDAGE ADHESIVE 1X3 (GAUZE/BANDAGES/DRESSINGS) ×9 IMPLANT
BENZOIN TINCTURE PRP APPL 2/3 (GAUZE/BANDAGES/DRESSINGS) ×3 IMPLANT
BLADE SURG ROTATE 9660 (MISCELLANEOUS) ×2 IMPLANT
CANISTER SUCTION 2500CC (MISCELLANEOUS) ×2 IMPLANT
CHLORAPREP W/TINT 26ML (MISCELLANEOUS) ×3 IMPLANT
COVER SURGICAL LIGHT HANDLE (MISCELLANEOUS) ×3 IMPLANT
DECANTER SPIKE VIAL GLASS SM (MISCELLANEOUS) ×3 IMPLANT
DEVICE SECURE STRAP 25 ABSORB (INSTRUMENTS) ×3 IMPLANT
DISSECT BALLN SPACEMKR + OVL (BALLOONS) ×3
DISSECTOR BALLN SPACEMKR + OVL (BALLOONS) ×2 IMPLANT
DISSECTOR BLUNT TIP ENDO 5MM (MISCELLANEOUS) IMPLANT
DRAPE PED LAPAROTOMY (DRAPES) ×3 IMPLANT
DRSG TEGADERM 4X4.75 (GAUZE/BANDAGES/DRESSINGS) ×3 IMPLANT
ELECT CAUTERY BLADE 6.4 (BLADE) ×3 IMPLANT
ELECT REM PT RETURN 9FT ADLT (ELECTROSURGICAL) ×3
ELECTRODE REM PT RTRN 9FT ADLT (ELECTROSURGICAL) ×2 IMPLANT
GAUZE SPONGE 2X2 8PLY STRL LF (GAUZE/BANDAGES/DRESSINGS) ×2 IMPLANT
GLOVE BIOGEL PI IND STRL 6.5 (GLOVE) ×1 IMPLANT
GLOVE BIOGEL PI IND STRL 7.0 (GLOVE) ×1 IMPLANT
GLOVE BIOGEL PI INDICATOR 6.5 (GLOVE) ×1
GLOVE BIOGEL PI INDICATOR 7.0 (GLOVE) ×1
GLOVE SS BIOGEL STRL SZ 6.5 (GLOVE) ×2 IMPLANT
GLOVE SUPERSENSE BIOGEL SZ 6.5 (GLOVE) ×2
GLOVE SURG SIGNA 7.5 PF LTX (GLOVE) ×3 IMPLANT
GOWN STRL NON-REIN LRG LVL3 (GOWN DISPOSABLE) ×6 IMPLANT
GOWN STRL REIN XL XLG (GOWN DISPOSABLE) ×3 IMPLANT
KIT BASIN OR (CUSTOM PROCEDURE TRAY) ×3 IMPLANT
KIT ROOM TURNOVER OR (KITS) ×3 IMPLANT
MESH 3DMAX LIGHT 4.1X6.2 LT LR (Mesh General) ×2 IMPLANT
MESH 3DMAX LIGHT 4.1X6.2 RT LR (Mesh General) ×2 IMPLANT
NDL HYPO 25GX1X1/2 BEV (NEEDLE) ×1 IMPLANT
NDL INSUFFLATION 14GA 120MM (NEEDLE) IMPLANT
NEEDLE HYPO 25GX1X1/2 BEV (NEEDLE) ×3 IMPLANT
NEEDLE INSUFFLATION 14GA 120MM (NEEDLE) ×3 IMPLANT
NS IRRIG 1000ML POUR BTL (IV SOLUTION) ×3 IMPLANT
PACK SURGICAL SETUP 50X90 (CUSTOM PROCEDURE TRAY) ×3 IMPLANT
PAD ARMBOARD 7.5X6 YLW CONV (MISCELLANEOUS) ×3 IMPLANT
PENCIL BUTTON HOLSTER BLD 10FT (ELECTRODE) ×3 IMPLANT
SET IRRIG TUBING LAPAROSCOPIC (IRRIGATION / IRRIGATOR) IMPLANT
SET TROCAR LAP APPLE-HUNT 5MM (ENDOMECHANICALS) ×3 IMPLANT
SPONGE GAUZE 2X2 STER 10/PKG (GAUZE/BANDAGES/DRESSINGS) ×1
STRIP CLOSURE SKIN 1/2X4 (GAUZE/BANDAGES/DRESSINGS) ×3 IMPLANT
SUT MNCRL AB 4-0 PS2 18 (SUTURE) ×3 IMPLANT
SUT NOVA NAB DX-16 0-1 5-0 T12 (SUTURE) ×3 IMPLANT
SUT VIC AB 3-0 SH 27 (SUTURE) ×3
SUT VIC AB 3-0 SH 27X BRD (SUTURE) ×2 IMPLANT
SYR CONTROL 10ML LL (SYRINGE) ×3 IMPLANT
TOWEL OR 17X24 6PK STRL BLUE (TOWEL DISPOSABLE) ×3 IMPLANT
TOWEL OR 17X26 10 PK STRL BLUE (TOWEL DISPOSABLE) ×3 IMPLANT
TRAY FOLEY CATH 16FR SILVER (SET/KITS/TRAYS/PACK) ×3 IMPLANT
TRAY LAPAROSCOPIC (CUSTOM PROCEDURE TRAY) ×3 IMPLANT
TUBE CONNECTING 12X1/4 (SUCTIONS) IMPLANT
YANKAUER SUCT BULB TIP NO VENT (SUCTIONS) IMPLANT

## 2013-05-14 NOTE — Transfer of Care (Signed)
Immediate Anesthesia Transfer of Care Note  Patient: Jeff [name]  Procedure(s) Performed: Procedure(s): LAPAROSCOPIC INGUINAL HERNIA REPAIR  (Bilateral) INSERTION OF MESH (Bilateral)  Patient Location: PACU  Anesthesia Type:General  Level of Consciousness: awake, alert , oriented and patient cooperative  Airway & Oxygen Therapy: Patient Spontanous Breathing and Patient connected to nasal cannula oxygen  Post-op Assessment: Report given to PACU RN and Post -op Vital signs reviewed and stable  Post vital signs: Reviewed and stable  Complications: No apparent anesthesia complications

## 2013-05-14 NOTE — Anesthesia Postprocedure Evaluation (Signed)
  Anesthesia Post-op Note  Patient: Jeff [name]  Procedure(s) Performed: Procedure(s): LAPAROSCOPIC INGUINAL HERNIA REPAIR  (Bilateral) INSERTION OF MESH (Bilateral)  Patient Location: PACU  Anesthesia Type:General  Level of Consciousness: awake and alert   Airway and Oxygen Therapy: Patient Spontanous Breathing  Post-op Pain: mild  Post-op Assessment: Post-op Vital signs reviewed, Patient's Cardiovascular Status Stable and Respiratory Function Stable  Post-op Vital Signs: Reviewed  Filed Vitals:   05/14/13 0918  BP:   Pulse:   Temp: 36.4 C  Resp:     Complications: No apparent anesthesia complications

## 2013-05-14 NOTE — Op Note (Signed)
LAPAROSCOPIC INGUINAL HERNIA REPAIR , INSERTION OF MESH  Procedure Note  Jeff [name] 05/14/2013   Pre-op Diagnosis: right inguinal hernia , umbilical hernia repair     Post-op Diagnosis: bilateral inguinal hernia, umbilical hernia  Procedure(s): LAPAROSCOPIC BILATERAL INGUINAL HERNIA REPAIR  INSERTION OF MESH UMBILICAL HERNIA REPAIR  Surgeon(s): Shelly Rubenstein, MD  Anesthesia: General  Staff:  Circulator: Amy Ames Dura, RN; Ailene Ards, RN Scrub Person: Gunnar Fusi, RN; Aundra Millet Day Cavanaugh, RN  Estimated Blood Loss: Minimal                         [name]   Date: 05/14/2013  Time: 8:18 AM

## 2013-05-14 NOTE — Anesthesia Preprocedure Evaluation (Addendum)
Anesthesia Evaluation  Patient identified by MRN, date of birth, ID band Patient awake    Reviewed: Allergy & Precautions, H&P , NPO status , Patient's Chart, lab work & pertinent test results, reviewed documented beta blocker date and time   Airway Mallampati: I TM Distance: >3 FB Neck ROM: Full    Dental no notable dental hx. (+) Teeth Intact and Dental Advisory Given   Pulmonary neg pulmonary ROS, former smoker,  breath sounds clear to auscultation  Pulmonary exam normal       Cardiovascular hypertension, Pt. on medications and Pt. on home beta blockers + dysrhythmias Atrial Fibrillation + Valvular Problems/Murmurs Rhythm:Regular Rate:Normal + Systolic murmurs    Neuro/Psych negative neurological ROS  negative psych ROS   GI/Hepatic negative GI ROS, Neg liver ROS,   Endo/Other  negative endocrine ROS  Renal/GU negative Renal ROS  negative genitourinary   Musculoskeletal   Abdominal   Peds  Hematology negative hematology ROS (+)   Anesthesia Other Findings   Reproductive/Obstetrics negative OB ROS                          Anesthesia Physical Anesthesia Plan  ASA: III  Anesthesia Plan: General   Post-op Pain Management:    Induction: Intravenous  Airway Management Planned: Oral ETT  Additional Equipment: Arterial line  Intra-op Plan:   Post-operative Plan: Extubation in OR  Informed Consent: I have reviewed the patients History and Physical, chart, labs and discussed the procedure including the risks, benefits and alternatives for the proposed anesthesia with the patient or authorized representative who has indicated his/her understanding and acceptance.   Dental advisory given  Plan Discussed with: CRNA  Anesthesia Plan Comments:         Anesthesia Quick Evaluation

## 2013-05-14 NOTE — Interval H&P Note (Signed)
History and Physical Interval Note: No change in H and P  05/14/2013 7:01 AM  Jeff [name]  has presented today for surgery, with the diagnosis of right inguinal hernia , umbilical hernia repair  The various methods of treatment have been discussed with the patient and family. After consideration of risks, benefits and other options for treatment, the patient has consented to  Procedure(s): LAPAROSCOPIC INGUINAL HERNIA REPAIR  (Right) HERNIA REPAIR UMBILICAL ADULT (N/A) INSERTION OF MESH (Right) as a surgical intervention .  The patient's history has been reviewed, patient examined, no change in status, stable for surgery.  I have reviewed the patient's chart and labs.  Questions were answered to the patient's satisfaction.     [name]

## 2013-05-14 NOTE — Anesthesia Procedure Notes (Signed)
Procedure Name: Intubation Date/Time: 05/14/2013 7:29 AM Performed by: Arlice Colt B Pre-anesthesia Checklist: Patient identified, Emergency Drugs available, Suction available, Patient being monitored and Timeout performed Patient Re-evaluated:Patient Re-evaluated prior to inductionOxygen Delivery Method: Circle system utilized Preoxygenation: Pre-oxygenation with 100% oxygen Intubation Type: IV induction Ventilation: Mask ventilation without difficulty Laryngoscope Size: Mac and 4 Grade View: Grade I Tube type: Oral Tube size: 7.5 mm Number of attempts: 1 Airway Equipment and Method: Stylet Placement Confirmation: ETT inserted through vocal cords under direct vision,  positive ETCO2 and breath sounds checked- equal and bilateral Secured at: 25 cm Tube secured with: Tape Dental Injury: Teeth and Oropharynx as per pre-operative assessment

## 2013-05-15 ENCOUNTER — Ambulatory Visit: Payer: BC Managed Care – PPO | Admitting: Hematology & Oncology

## 2013-05-15 ENCOUNTER — Other Ambulatory Visit: Payer: BC Managed Care – PPO | Admitting: Lab

## 2013-05-15 NOTE — Op Note (Signed)
NAMETYJAI, MATUSZAK                ACCOUNT NO.:  [id number]  MEDICAL RECORD NO.:  [id number]  LOCATION:  [facility]                         FACILITY:  [facility]  PHYSICIAN:  Abigail Miyamoto, M.D. DATE OF BIRTH:  December 14, 1950  DATE OF PROCEDURE:  05/14/2013 DATE OF DISCHARGE:  05/14/2013                              OPERATIVE REPORT   PREOPERATIVE DIAGNOSIS:  Right inguinal hernia, umbilical hernia.  POSTOPERATIVE DIAGNOSIS:  Bilateral inguinal hernia, umbilical hernia.  PROCEDURE:  Bilateral laparoscopic inguinal hernia repair with mesh and umbilical hernia repair.  SURGEON:  Abigail Miyamoto, MD  ANESTHESIA:  General and 0.5% Marcaine.  ESTIMATED BLOOD LOSS:  Minimal.  FINDINGS:  The patient was found to have bilateral indirect and small direct inguinal hernias as well as a tiny umbilical hernia.  I repaired the bilateral inguinal hernias with Bard 3D light large Prolene mesh. The umbilical hernia was repaired primarily.  PROCEDURE IN DETAIL:  The patient was brought to the operating room, identified as [employer].  He was placed supine on the operating room table.  General anesthesia was induced.  A Foley catheter was then inserted.  His abdomen was then prepped and draped in usual sterile fashion.  I made a small vertical incision below the umbilicus.  I took this down to the fascia which was opened just to the right of the midline.  The rectus muscles were then identified and elevated.  The dissecting balloon was then passed underneath the rectus muscle and manipulated toward the pubis.  The dissecting balloon was then insufflated under direct vision dissecting out the preperitoneal space. I then removed the dissecting balloon and insufflated the preperitoneal space with carbon dioxide.  I placed two 5 mm ports in the patient's lower midline under direct vision.  I dissected out the right inguinal area first.  The patient had a very small direct hernia defect as well as an  indirect hernia sac.  I was able to with some difficulty reduce the sac, which was chronic from the testicular cord and structures.  I was able to completely reduce the sac.  I then turned my attention toward the left inguinal area.  Again this patient had a very small direct as well as indirect inguinal hernia.  I was able to reduce the hernia sacs and separated from the cord structures as well.  I then identified Cooper's ligament bilaterally.  First I brought a large piece of Bard 3D like Prolene mesh onto the field.  I placed it through the umbilical port and opened it as an onlay on the left inguinal floor.  I then used the absorbable tacker to tack the mesh to Cooper's ligament up the medial abdominal wall and slightly laterally.  I then brought a right-sided piece of Bard 3D Prolene mesh onto the field.  I put the large right-sided through the umbilical port and opened on the right inguinal floor and again tacked it to Cooper's ligament up to medial abdominal wall and out laterally covering the testicular cord and structures and defect as well.  At this point, I again examined both sites and repair was felt to be intact.  Hemostasis also felt to be achieved.  All ports were removed under direct vision.  The preperitoneal spaces seen to lay appropriately.  At this point, I previously had to place a Veress needle because of air leaking into the abdominal cavity.  I removed the Veress needle and then opened up the peritoneum at the umbilicus and removed the rest of the pneumoperitoneum.  I then closed the very tiny fascial defect and fascial incision with a figure-of-eight 0 Vicryl suture.  I then anesthetized all wounds with Marcaine and performed ilioinguinal nerve blocks bilateral with Marcaine.  I then closed all skin incisions with 4- 0 Monocryl sutures.  Steri-Strips and Band-Aids were then applied.  The patient tolerated the procedure well.  All the counts were correct at the  end of the procedure.  The patient was then extubated in the operating room and taken in stable condition to recovery room.     Abigail Miyamoto, M.D.     DB/MEDQ  D:  05/14/2013  T:  05/15/2013  Job:  [id number]

## 2013-05-18 ENCOUNTER — Ambulatory Visit: Payer: BC Managed Care – PPO | Admitting: Neurology

## 2013-05-19 ENCOUNTER — Encounter (HOSPITAL_COMMUNITY): Payer: Self-pay | Admitting: Surgery

## 2013-05-25 ENCOUNTER — Telehealth: Payer: Self-pay

## 2013-05-25 NOTE — Telephone Encounter (Signed)
Called pt and relayed Dr.Patel's message. He is doing well since Hernia repair. His right foot is still swollen and tingling, he has been putting it in cold water 30 mins a day and that has helped a little. He has noticed his left foot tingling more and is anxious for the EMG to hopefully figure out the cause.

## 2013-05-25 NOTE — Telephone Encounter (Signed)
No, he asked the possible causes of foot tingling. Dr.patel sent me a message answering him.

## 2013-05-25 NOTE — Telephone Encounter (Signed)
Jeff [name], I don't see the prior conversation with Dr. Allena Katz.  Is there something I need to do with this?

## 2013-06-08 ENCOUNTER — Encounter (INDEPENDENT_AMBULATORY_CARE_PROVIDER_SITE_OTHER): Payer: Self-pay | Admitting: Surgery

## 2013-06-08 ENCOUNTER — Ambulatory Visit (INDEPENDENT_AMBULATORY_CARE_PROVIDER_SITE_OTHER): Payer: BC Managed Care – PPO | Admitting: Surgery

## 2013-06-08 VITALS — BP 120/80 | HR 71 | Temp 97.5°F | Resp 16 | Ht 77.0 in | Wt 229.8 lb

## 2013-06-08 DIAGNOSIS — Z09 Encounter for follow-up examination after completed treatment for conditions other than malignant neoplasm: Secondary | ICD-10-CM

## 2013-06-08 NOTE — Progress Notes (Signed)
Subjective:     Patient ID: Jeff [name], male   DOB: 05-Feb-1951, 63 y.o.   MRN: [id number]  HPI He is here for his first postop visit status post bilateral laparoscopic inguinal hernia repair with mesh and umbilical hernia repair. He is doing well and has no complaints.  Review of Systems     Objective:   Physical Exam On exam, his incisions are well-healed without evidence of recurrent hernia    Assessment:     Patient stable postop     Plan:     He may now resume his normal activity. I will see him back as needed

## 2013-06-11 ENCOUNTER — Encounter: Payer: BC Managed Care – PPO | Admitting: Neurology

## 2013-06-11 ENCOUNTER — Ambulatory Visit (INDEPENDENT_AMBULATORY_CARE_PROVIDER_SITE_OTHER): Payer: BC Managed Care – PPO | Admitting: Neurology

## 2013-06-11 DIAGNOSIS — M5417 Radiculopathy, lumbosacral region: Secondary | ICD-10-CM

## 2013-06-11 DIAGNOSIS — G6289 Other specified polyneuropathies: Secondary | ICD-10-CM

## 2013-06-11 DIAGNOSIS — G589 Mononeuropathy, unspecified: Secondary | ICD-10-CM

## 2013-06-11 DIAGNOSIS — G608 Other hereditary and idiopathic neuropathies: Principal | ICD-10-CM

## 2013-06-11 DIAGNOSIS — IMO0002 Reserved for concepts with insufficient information to code with codable children: Secondary | ICD-10-CM

## 2013-06-12 ENCOUNTER — Encounter: Payer: Self-pay | Admitting: Neurology

## 2013-06-12 NOTE — Procedures (Signed)
[facility]  [place], [place]  [place], [place] [address] Tel: [phone] Fax:  [phone] Test Date:  06/11/2013  Patient: Jeff [name] DOB: [birth year] Physician: Narda Amber, DO  Sex: Male Height: 6\' 5"  Ref Phys: Alonza Bogus  ID#: [id number] Temp: 33.2C Technician:    Patient Complaints: This is a 63 year-old gentleman presenting with paresthesias and swelling of his feet, worse on the right.  NCV & EMG Findings: Extensive evaluation of the right lower extremity and additional studies of the left reveals:  1. Absent sural and superficial peroneal responses bilaterally. 2. The peroneal motor response at the extensor digitorum brevis is reduced and normal when recorded at the tibialis anterior bilaterally. The tibial motor responses also reduced. 3. H-reflexes are prolonged bilaterally. 4. Chronic motor axonal loss changes are seen in the tibialis anterior, posterior tibialis, and gluteus medius without accompanied active denervation. Similar findings are seen on the left side.  Impression: 1. 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 2. 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 radiculopathy).    ___________________________ Narda Amber, DO    Nerve Conduction Studies Anti Sensory Summary Table   Site NR Peak (ms) Norm Peak (ms) P-T Amp (V) Norm P-T Amp  Left Sup Peroneal Anti Sensory (Ant Lat Mall)  12 cm NR  <4.6  >3  Right Sup Peroneal Anti Sensory (Ant Lat Mall)  12 cm NR  <4.6  >3  Left Sural Anti Sensory (Lat Mall)  Calf NR  <4.6  >3  Right Sural Anti Sensory (Lat Mall)  Calf NR  <4.6  >3   Motor Summary Table   Site NR Onset (ms) Norm Onset (ms) O-P Amp (mV) Norm O-P Amp Site1 Site2 Delta-0 (ms) Dist (cm) Vel (m/s) Norm Vel (m/s)  Left Peroneal  Motor (Ext Dig Brev)  Ankle    4.8 <6.0 0.8 >2.5 B Fib Ankle 11.4 41.0 36 >40  B Fib    16.2  0.5  Poplt B Fib  10.0  >40  Poplt NR            Right Peroneal Motor (Ext Dig Brev)  Ankle    4.1 <6.0 1.1 >2.5 B Fib Ankle 11.3 43.0 38 >40  B Fib    15.4  0.9  Poplt B Fib  10.0  >40  Poplt NR            Left Peroneal TA Motor (Tib Ant)  Fib Head    4.1 <4.5 4.7 >3 Poplit Fib Head 1.8 10.0 56 >40  Poplit    5.9  4.4         Right Peroneal TA Motor (Tib Ant)  Fib Head    4.1 <4.5 3.9 >3 Poplit Fib Head 1.2 10.0 83 >40  Poplit    5.3  3.4         Left Tibial Motor (Abd Hall Brev)  Ankle    4.9 <6.0 1.9 >4 Knee Ankle 12.7 46.0 36 >40  Knee    17.6  1.6         Right Tibial Motor (Abd Hall Brev)  Ankle    4.2 <6.0 2.1 >4 Knee Ankle 13.2 46.0 35 >40  Knee    17.4  1.4          H Reflex Studies   NR H-Lat (ms) Lat Norm (ms) L-R H-Lat (ms)  Left Tibial (Gastroc)     42.22 <35 0.00  Right Tibial (Gastroc)     42.22 <35 0.00   EMG   Side Muscle Ins Act Fibs Psw Fasc Number Recrt Dur Dur. Amp Amp. Poly Poly. Comment  Right AntTibialis Nml Nml Nml Nml 1- Mod Few 1+ Nml Nml Nml Nml N/A  Right Gastroc Nml Nml Nml Nml Nml Nml Nml Nml Nml Nml Nml Nml N/A  Right PostTibialis Nml Nml Nml Nml 1- Mod-R Some 1+ Some 1+ Nml Nml N/A  Right RectFemoris Nml Nml Nml Nml Nml Nml Nml Nml Nml Nml Nml Nml N/A  Right GluteusMed Nml Nml Nml Nml 1- Mod-R Some 1+ Nml Nml Nml Nml N/A  Left AntTibialis Nml Nml Nml Nml 1- Mod Few 1+ Nml Nml Nml Nml N/A  Left Flex Dig Long Nml Nml Nml Nml 1- Mod-R Some 1+ Some 1+ Nml Nml N/A  Left Gastroc Nml Nml Nml Nml Nml Nml Nml Nml Nml Nml Nml Nml N/A  Left GluteusMed Nml Nml Nml Nml Nml Nml Nml Nml Nml Nml Nml Nml N/A      Waveforms:

## 2013-06-12 NOTE — Progress Notes (Signed)
See procedure note for EMG results.  Jeff Parmelee K. Margaretmary Prisk, DO  

## 2013-06-29 ENCOUNTER — Telehealth: Payer: Self-pay | Admitting: *Deleted

## 2013-06-29 NOTE — Telephone Encounter (Signed)
Dr Tat,  Patient received an electronic questionaire asking how he was doing after his appointment. He notes that he continues to have tingling that is now spreading to the left foot. He had an EMG completed by Dr Posey Pronto on 06/12/13. However, I do not see where he has advised of results. Will he be advised of the results at his appointment in April or did you want [place] to contact him prior to his appointment?

## 2013-06-29 NOTE — Telephone Encounter (Signed)
Left message for patient to call back  

## 2013-06-29 NOTE — Telephone Encounter (Signed)
Tell him I am sorry about that.  The EMG confirmed my suspicion of peripheral neuropathy.  Tell him that medications are the only treatment options for this and they are only symptommatic(dont change the neuropathy itself).  Has he been on lyrica or neurontin previously for this?  Would he like to try something?

## 2013-06-30 NOTE — Telephone Encounter (Signed)
Called pt again and left message for him to call me back.

## 2013-07-01 NOTE — Telephone Encounter (Signed)
I spoke with pt and he said that he would like to try either Lyrica or Neurontin to help with his neuropathy.  Informed him that we would send a prescription in to his pharmacy.  Thanks.

## 2013-07-02 ENCOUNTER — Telehealth: Payer: Self-pay | Admitting: *Deleted

## 2013-07-02 NOTE — Telephone Encounter (Signed)
Tell him that I think we should try neurontin first as its cheaper.  Give him following titration schedule:  neurontin 300 mg:  1 po q hs x 1 week, then 1 po bid, then 1 in am and 2 at night and we can see how he is doing.  Send in #90 with 4 RF

## 2013-07-03 ENCOUNTER — Other Ambulatory Visit: Payer: BC Managed Care – PPO | Admitting: Lab

## 2013-07-03 ENCOUNTER — Ambulatory Visit: Payer: BC Managed Care – PPO | Admitting: Hematology & Oncology

## 2013-07-03 NOTE — Telephone Encounter (Signed)
Pt returning call to [place] / Gayleen Orem

## 2013-07-03 NOTE — Telephone Encounter (Signed)
Left message for pt to call me back 

## 2013-07-06 ENCOUNTER — Telehealth: Payer: Self-pay | Admitting: *Deleted

## 2013-07-06 MED ORDER — GABAPENTIN 300 MG PO CAPS: ORAL_CAPSULE | ORAL | Status: DC

## 2013-07-06 NOTE — Telephone Encounter (Signed)
Patient active in [place]. Message sent to patient making him aware of RX being sent to his pharmacy and the titration schedule. He is to call back with any questions.

## 2013-07-06 NOTE — Telephone Encounter (Signed)
This is a Dr. Carles Collet patient that I have tried calling several times and have left several messages for patient to call office back, no success.  The note below is reason why we need to contact patient, changes in medication need to be discussed with patient.   Eustace Quail Tat, DO at 07/02/2013 8:42 AM     Status: Signed        Tell him that I think we should try neurontin first as its cheaper. Give him following titration schedule: neurontin 300 mg: 1 po q hs x 1 week, then 1 po bid, then 1 in am and 2 at night and we can see how he is doing. Send in #90 with 4 RF

## 2013-07-06 NOTE — Telephone Encounter (Signed)
Spoke with pt and informed him of what Dr. Carles Collet would like to do.  Pt agreed.  Rx sent to pharmacy.

## 2013-07-06 NOTE — Telephone Encounter (Signed)
Jeff [name] sent in medication

## 2013-07-06 NOTE — Addendum Note (Signed)
Addended byAnnamaria Helling on: 07/06/2013 02:42 PM   Modules accepted: Orders

## 2013-08-02 ENCOUNTER — Encounter: Payer: Self-pay | Admitting: Neurology

## 2013-08-03 NOTE — Telephone Encounter (Signed)
Pt called wanting to follow up on the e-mail he sent and I related the information  That Dr. Carles Collet had responded to his side effect question.

## 2013-08-07 ENCOUNTER — Ambulatory Visit (INDEPENDENT_AMBULATORY_CARE_PROVIDER_SITE_OTHER): Payer: BC Managed Care – PPO | Admitting: Internal Medicine

## 2013-08-07 ENCOUNTER — Encounter: Payer: Self-pay | Admitting: Internal Medicine

## 2013-08-07 VITALS — BP 123/83 | HR 62 | Temp 97.9°F | Wt 226.0 lb

## 2013-08-07 DIAGNOSIS — I83893 Varicose veins of bilateral lower extremities with other complications: Secondary | ICD-10-CM

## 2013-08-07 DIAGNOSIS — R109 Unspecified abdominal pain: Secondary | ICD-10-CM

## 2013-08-07 NOTE — Progress Notes (Signed)
Pre visit review using our clinic review tool, if applicable. No additional management support is needed unless otherwise documented below in the visit note. 

## 2013-08-07 NOTE — Progress Notes (Signed)
Subjective:    Patient ID: Jeff [name], male    DOB: 01/19/1951, 63 y.o.   MRN: [id number]  DOS:  08/07/2013 Type of  visit:   ROV Since the last time he was here, had hernia surgery, surgery went well. Complain of occasional discomfort at the left upper abdomen, left chest. Happens once or twice a day, usually when he turns his torso certain ways, denies symptoms after eating or during exercise. Symptoms started in November 2014. Continue with lower extremity edema, right side    ROS In general feeling well. Had to discontinue gabapentin, see assessment and plan Denies nausea, vomiting, diarrhea or GERD symptoms   Past Medical History  Diagnosis Date  . Hyperlipidemia     takes Simvastatin daily  . Hypertrophic obstructive cardiomyopathy     takes Amoxicillin prior to dental work and Metoprolol daily  . Psoriasis     uses Temovate cream as needed  . Allergic rhinitis   . Parkinsonism     takes Mirapex tid and Sinemet tid as well  . Heart murmur   . Atrial fibrillation 09/2008    hx of-takes Norpace daily  . Atrial flutter 09/2008    hx of  . Pneumonia     hx of-many years ago  . Numbness and tingling     both legs  . Peripheral edema     right foot-seeing Dr.Rykin Route on Wed about this  . Hemorrhoids   . Enlarged prostate but doesn't take any meds  . Cataract     immature but not sure which eye  . Insomnia     but no meds required    Past Surgical History  Procedure Laterality Date  . Colonoscopy      x 2  . Polypectomy    . Tonsillectomy      as a child  . Inguinal hernia repair Bilateral 05/14/2013    Procedure: LAPAROSCOPIC INGUINAL HERNIA REPAIR ;  Surgeon: Harl Bowie, MD;  Location: [facility];  Service: General;  Laterality: Bilateral;  . Insertion of mesh Bilateral 05/14/2013    Procedure: INSERTION OF MESH;  Surgeon: Harl Bowie, MD;  Location: [facility];  Service: General;  Laterality: Bilateral;  . Hernia repair      History   Social  History  . Marital Status: Married    Spouse Name: N/A    Number of Children: 0  . Years of Education: N/A   Occupational History  . [employer] and [employer] professor   Social History Main Topics  . Smoking status: Former Smoker    Types: Cigarettes  . Smokeless tobacco: Never Used     Comment: quit smoking > 30yr  . Alcohol Use: 1.5 oz/week    3 drink(s) per week     Comment: beer a couple of times a week  . Drug Use: Yes     Comment: Smokes marijuana once a week  . Sexual Activity: Yes   Other Topics Concern  . Not on file   Social History Narrative  . No narrative on file        Medication List       This list is accurate as of: 08/07/13  6:31 PM.  Always use your most recent med list.               amoxicillin 500 MG capsule  Commonly known as:  AMOXIL  Take 2,000 mg by mouth See admin  instructions. Take 2 hours prior to dental work     aspirin EC 325 MG tablet  Take 325 mg by mouth daily.     carbidopa-levodopa 25-100 MG per tablet  Commonly known as:  SINEMET IR  Take 2 tablets by mouth 3 (three) times daily.     clobetasol cream 0.05 %  Commonly known as:  TEMOVATE  Apply 1 application topically 2 (two) times daily as needed (for skin irritation).     disopyramide 150 MG capsule  Commonly known as:  NORPACE  Take 1 capsule (150 mg total) by mouth 2 (two) times daily.     metoprolol succinate 25 MG 24 hr tablet  Commonly known as:  TOPROL-XL  Take 25 mg by mouth daily.     pramipexole 0.5 MG tablet  Commonly known as:  MIRAPEX  Take 1 tablet (0.5 mg total) by mouth 3 (three) times daily.     simvastatin 40 MG tablet  Commonly known as:  ZOCOR  Take 40 mg by mouth daily.     vitamin B-12 1000 MCG tablet  Commonly known as:  CYANOCOBALAMIN  Take 1,000 mcg by mouth daily.           Objective:   Physical Exam BP 123/83  Pulse 62  Temp(Src) 97.9 F (36.6 C)  Wt 226 lb (102.513 kg)  SpO2 98% General --  alert, well-developed, NAD.   Lungs -- normal respiratory effort, no intercostal retractions, no accessory muscle use, and normal breath sounds.  Heart-- normal rate, soft syst  murmur.  Extremities-- +/+++ pretibial edema R, Prominent but not tender varicose veins noted. The left leg have trace lower extremity edema and less prominent varicose veins Neurologic--  alert & oriented X3. Speech normal, gait normal, strength normal in all extremities.  Psych-- Cognition and judgment appear intact. Cooperative with normal attention span and concentration. No anxious or depressed appearing.     Assessment & Plan:   Upper abdominal pain, Left upper abdominal/chest pain, atypical, doubt related to his heart. Recommend observation  Was told he had neuropathy, tried Neurontin, was unable to take due to to increase in bladder symptoms. Plans to discuss further steps w/ neurology.

## 2013-08-07 NOTE — Patient Instructions (Signed)
  Next visit is for a physical exam in 6 months , fasting Please make an appointment      

## 2013-08-07 NOTE — Assessment & Plan Note (Signed)
The last time he was here had right lower extremity edema, ultrasound was negative for DVT. Symptoms likely related to varicose veins. Recommend compression stockings, will call if he needs a prescription for it.

## 2013-08-10 ENCOUNTER — Other Ambulatory Visit: Payer: Self-pay | Admitting: Internal Medicine

## 2013-08-10 ENCOUNTER — Other Ambulatory Visit: Payer: Self-pay | Admitting: Neurology

## 2013-08-10 NOTE — Telephone Encounter (Signed)
Carbidopa Levodopa refill requested. Per last office note- patient to remain on medication. Refill approved and sent to patient's pharmacy.   

## 2013-08-12 ENCOUNTER — Other Ambulatory Visit: Payer: Self-pay | Admitting: *Deleted

## 2013-08-12 DIAGNOSIS — I8390 Asymptomatic varicose veins of unspecified lower extremity: Secondary | ICD-10-CM

## 2013-08-17 ENCOUNTER — Other Ambulatory Visit: Payer: Self-pay | Admitting: Internal Medicine

## 2013-08-26 ENCOUNTER — Encounter: Payer: Self-pay | Admitting: Neurology

## 2013-08-27 ENCOUNTER — Encounter: Payer: Self-pay | Admitting: Neurology

## 2013-09-14 NOTE — Telephone Encounter (Signed)
error 

## 2013-09-21 ENCOUNTER — Other Ambulatory Visit: Payer: Self-pay | Admitting: Internal Medicine

## 2013-09-22 ENCOUNTER — Telehealth: Payer: Self-pay | Admitting: Neurology

## 2013-09-22 ENCOUNTER — Encounter: Payer: Self-pay | Admitting: Neurology

## 2013-09-22 ENCOUNTER — Other Ambulatory Visit: Payer: Self-pay | Admitting: Internal Medicine

## 2013-09-22 ENCOUNTER — Ambulatory Visit (INDEPENDENT_AMBULATORY_CARE_PROVIDER_SITE_OTHER): Payer: BC Managed Care – PPO | Admitting: Neurology

## 2013-09-22 VITALS — BP 112/76 | HR 70 | Resp 16 | Ht 77.0 in | Wt 233.8 lb

## 2013-09-22 DIAGNOSIS — G609 Hereditary and idiopathic neuropathy, unspecified: Secondary | ICD-10-CM

## 2013-09-22 DIAGNOSIS — Z5181 Encounter for therapeutic drug level monitoring: Secondary | ICD-10-CM

## 2013-09-22 DIAGNOSIS — E538 Deficiency of other specified B group vitamins: Secondary | ICD-10-CM

## 2013-09-22 DIAGNOSIS — G20A1 Parkinson's disease without dyskinesia, without mention of fluctuations: Secondary | ICD-10-CM

## 2013-09-22 DIAGNOSIS — G629 Polyneuropathy, unspecified: Secondary | ICD-10-CM

## 2013-09-22 DIAGNOSIS — G20 Parkinson's disease: Secondary | ICD-10-CM

## 2013-09-22 NOTE — Progress Notes (Signed)
Jeff [name] was seen today in the movement disorders clinic for neurologic f/u for PD.  His wife reports that tremor began intermittently 10 years ago but PD was dx by Dr. Jacelyn Grip on 07/27/11.  The patient stated that he quit using the laser pointer in the R hand over the last year b/c of tremor.  His wife would note that tremor would occur at rest.  Tremor does not interfere with guitar playing and he does not notice if it he is using the [place].  He has noted that his R foot feels odd and stiff.  It is not painful.  11/24/2012 update:   He is on carbidopa/levodopa 25/100, 2 tablets 3 times a day.  His Mirapex is 0.5 mg 3 times a day.  He states that he is doing very well, although he does not know if he has noticed a big difference with the addition of Mirapex.  He is noting that he has an ache in the R hand.  He is learning to play guitar and feels that there is a tightness in it.  The dexterity in it seems okay.  He keeps time with the R foot and if he does not, he will note that it moves on its own.  He has some cramping in the R great toe but is unsure if it is related to dosing.    05/12/13:  The pt reports that 3 months ago, he bent down to catch something that was falling and it caused a hernia.  He is scheduled for hernia repair on Thursday.  He c/o R foot paresthesias and the top of the foot is hurting now and the R foot feels swollen.  He is starting to have paresthesias across the L foot but it doesn't hurt like the right.  He is having trouble staying asleep.  He gets up at 3 am and cannot fall back asleep.  However, he is having EDS.  He almost fell asleep driving.  He is still taking B12 supplement for the B12 deficiency.  He is planning on having surgery on Thursday for a hernia)  09/22/13 update:  The patient is following up regarding his Parkinson's disease, accompanied by his wife who helps to supplement the history.  The patient is currently on carbidopa/levodopa 25/100, 2 tablets 3 times per  day.  Last visit, his Mirapex was reduced and he is currently taking 0.5 mg, half tablet 3 times per day.  He does state that he no longer has the hypersomnolence after the reduction of the dose.  Last visit, he was complaining about paresthesias in the feet.  He subsequently had an EMG done by Dr. Posey Pronto, which revealed mild to moderate peripheral neuropathy bilaterally as well as a left L5 radiculopathy.  Because of symptoms, we started him on gabapentin.  Unfortunately, he experienced an increase in bladder symptoms and he ended up discontinuing the medication.  Today, however, the patient states that he is not so sure that the symptoms were from the Neurontin.  He wonders if it was a side effect from his hernia surgery and being catheterized.  He would like to retry the Neurontin.  He has not had any falls since our last visit.  No hallucinations.  No lightheadedness or near syncope.  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    PREVIOUS MEDICATIONS: Sinemet  ALLERGIES:  No Known Allergies  CURRENT MEDICATIONS:  Current Outpatient Prescriptions on File Prior to Visit  Medication Sig Dispense Refill  .  amoxicillin (AMOXIL) 500 MG capsule Take 2,000 mg by mouth See admin instructions. Take 2 hours prior to dental work      . aspirin EC 325 MG tablet Take 325 mg by mouth daily.        . carbidopa-levodopa (SINEMET IR) 25-100 MG per tablet TAKE 2 TABLETS BY MOUTH THREE TIMES DAILY  540 tablet  0  . clobetasol cream (TEMOVATE) 4.85 % Apply 1 application topically 2 (two) times daily as needed (for skin irritation).      Marland Kitchen disopyramide (NORPACE) 150 MG capsule Take 1 capsule (150 mg total) by mouth 2 (two) times daily.  30 capsule  0  . fexofenadine (ALLEGRA) 60 MG tablet TAKE 1 TABLET BY MOUTH TWICE DAILY AS NEEDED  60 tablet  2  . metoprolol succinate (TOPROL-XL) 25 MG 24 hr tablet Take 25 mg by mouth daily.      . metoprolol succinate (TOPROL-XL) 25 MG 24 hr tablet TAKE 1 TABLET BY MOUTH  EVERY DAY  90 tablet  0  . pramipexole (MIRAPEX) 0.5 MG tablet Take 1 tablet (0.5 mg total) by mouth 3 (three) times daily.  270 tablet  1  . simvastatin (ZOCOR) 40 MG tablet TAKE 1 TABLET BY MOUTH DAILY AT BEDTIME  30 tablet  5  . vitamin B-12 (CYANOCOBALAMIN) 1000 MCG tablet Take 1,000 mcg by mouth daily.       No current facility-administered medications on file prior to visit.    PAST MEDICAL HISTORY:   Past Medical History  Diagnosis Date  . Hyperlipidemia     takes Simvastatin daily  . Hypertrophic obstructive cardiomyopathy     takes Amoxicillin prior to dental work and Metoprolol daily  . Psoriasis     uses Temovate cream as needed  . Allergic rhinitis   . Parkinsonism     takes Mirapex tid and Sinemet tid as well  . Heart murmur   . Atrial fibrillation 09/2008    hx of-takes Norpace daily  . Atrial flutter 09/2008    hx of  . Pneumonia     hx of-many years ago  . Numbness and tingling     both legs  . Peripheral edema     right foot-seeing Dr.Paz on Wed about this  . Hemorrhoids   . Enlarged prostate but doesn't take any meds  . Cataract     immature but not sure which eye  . Insomnia     but no meds required    PAST SURGICAL HISTORY:   Past Surgical History  Procedure Laterality Date  . Colonoscopy      x 2  . Polypectomy    . Tonsillectomy      as a child  . Inguinal hernia repair Bilateral 05/14/2013    Procedure: LAPAROSCOPIC INGUINAL HERNIA REPAIR ;  Surgeon: Harl Bowie, MD;  Location: [place];  Service: General;  Laterality: Bilateral;  . Insertion of mesh Bilateral 05/14/2013    Procedure: INSERTION OF MESH;  Surgeon: Harl Bowie, MD;  Location: [place];  Service: General;  Laterality: Bilateral;  . Hernia repair      SOCIAL HISTORY:   History   Social History  . Marital Status: Married    Spouse Name: N/A    Number of Children: 0  . Years of Education: N/A   Occupational History  . [employer] and [employer] professor   Social History Main Topics  . Smoking status: Former  Smoker    Types: Cigarettes  . Smokeless tobacco: Never Used     Comment: quit smoking > 56yr  . Alcohol Use: 1.5 oz/week    3 drink(s) per week     Comment: beer a couple of times a week  . Drug Use: Yes     Comment: Smokes marijuana once a week  . Sexual Activity: Yes   Other Topics Concern  . Not on file   Social History Narrative  . No narrative on file    FAMILY HISTORY:   Family Status  Relation Status Death Age  . Father Deceased     AD, pneumonia  . Mother Alive     healthy  . Brother Alive     2, HIV positive  . Sister Alive     healthy    ROS:  A complete 10 system review of systems was obtained and was unremarkable apart from what is mentioned above.  PHYSICAL EXAMINATION:    VITALS:   Filed Vitals:   09/22/13 1423  BP: 112/76  Pulse: 70  Resp: 16  Height: 6\' 5"  (1.956 m)  Weight: 233 lb 12.8 oz (106.051 kg)    GEN:  The patient appears stated age and is in NAD. HEENT:  Normocephalic, atraumatic.  The mucous membranes are moist. The superficial temporal arteries are without ropiness or tenderness. CV:  RRR with 3/6 SEM Lungs:  CTAB Neck/HEME:  There are no carotid bruits bilaterally.  Neurological examination:  Orientation: The patient is alert and oriented x3. Fund of knowledge is appropriate.  Recent and remote memory are intact.  Attention and concentration are normal.    Able to name objects and repeat phrases. Cranial nerves: There is good facial symmetry. There is minor facial hypomimia.  There is left ptosis.  Pupils are equal round and reactive to light bilaterally. Fundoscopic exam reveals clear margins bilaterally. Extraocular muscles are intact. The visual fields are full to confrontational testing. The speech is fluent and clear. Soft palate rises symmetrically and there is no tongue deviation. Hearing is intact to conversational tone.   Movement  examination: Tone: There is normal tone in the upper and lower extremities today. Abnormal movements: There is no dyskinesia.  Very rare tremor was seen on the right. Coordination:  There is no decremation with RAM's, including with alternating supination and pronation, hand opening and closing, heel taps, finger taps and toe taps. Gait and Station: The patient has no difficulty arising out of a deep-seated chair without the use of the [place]. The patient's stride length is normal today and arm swing is good.   The patient has a negative pull test.    There is RUE tremor with ambulation.  LABS:  Lab Results  Component Value Date   WBC 6.2 05/11/2013   HGB 15.3 05/11/2013   HCT 44.0 05/11/2013   MCV 89.2 05/11/2013   PLT 144* 05/11/2013     Chemistry      Component Value Date/Time   NA 140 05/11/2013 1043   K 4.2 05/11/2013 1043   CL 103 05/11/2013 1043   CO2 27 05/11/2013 1043   BUN 13 05/11/2013 1043   CREATININE 1.00 05/11/2013 1043      Component Value Date/Time   CALCIUM 9.3 05/11/2013 1043   ALKPHOS 49 02/13/2013 1052   AST 36 02/13/2013 1052   ALT 19 02/13/2013 1052   BILITOT 0.9 02/13/2013 1052     Lab Results  Component Value Date   EGBTDVVO16 073  07/23/2012   Lab Results  Component Value Date   TSH 1.33 02/13/2013     ASSESSMENT/PLAN:  1.  Idiopathic, tremor predominant Parkinson's disease.  This is fairly early in onset, dx 07/2011.  This is evidenced by rigidity, tremor and minor bradykinesia.  He really has no postural instability.  I am seeing some minor motor fluctuations today, as evidenced by dyskinesia (very rare and minor).  -he will remain on his carbidopa/levodopa 25/100, 2 tablets 3 times a day.  - He will continue the Mirapex 0.5 mg, half a tablet 3 times per day.  The reduced dosage helped the hypersomnolence.  -Risks, benefits, side effects and alternative therapies were discussed.  The opportunity to ask questions was given and they were answered to  the best of my ability.  The patient expressed understanding and willingness to follow the outlined treatment protocols.  -I encouraged CV exercise. 2.  B12 deficiency.    -he is on oral B12 supplementation and I will recheck today. 3.  Peripheral neuropathy  -Labs will be checked including the B12, RPR, SPEP/UPEP.  Safety discussed.  Neurontin started again and will work to 300 mg in the AM and 600 at night. 4.   L ptosis  -this is chronic and stable per pt. 5.  F/u 3-4 months.

## 2013-09-22 NOTE — Telephone Encounter (Signed)
Jeff [name] has questions about lab orders. CB# [phone] / Sherri S.

## 2013-09-22 NOTE — Patient Instructions (Signed)
1. Your provider has requested that you have labwork completed today. Please go to [facility] on the first floor of this building before leaving the office today. 2. Follow up 4 months.

## 2013-09-23 LAB — RPR

## 2013-09-23 LAB — VITAMIN B12: VITAMIN B 12: 1041 pg/mL — AB (ref 211–911)

## 2013-09-23 LAB — FOLATE: Folate: 14.4 ng/mL

## 2013-09-23 NOTE — Telephone Encounter (Signed)
Called back and the person who answered had no idea what the question/problem was with the labs. They will call back if they still have questions.

## 2013-09-24 LAB — UIFE/LIGHT CHAINS/TP QN, 24-HR UR
ALBUMIN, U: DETECTED
ALPHA 1 UR: DETECTED — AB
ALPHA 2 UR: DETECTED — AB
Beta, Urine: DETECTED — AB
FREE KAPPA/LAMBDA RATIO: 7.45 ratio (ref 2.04–10.37)
Free Kappa Lt Chains,Ur: 1.49 mg/dL (ref 0.14–2.42)
Free Lambda Lt Chains,Ur: 0.2 mg/dL (ref 0.02–0.67)
Gamma Globulin, Urine: DETECTED — AB
TOTAL PROTEIN, URINE-UPE24: 2.7 mg/dL

## 2013-09-24 LAB — SPEP & IFE WITH QIG
ALPHA-1-GLOBULIN: 3.5 % (ref 2.9–4.9)
ALPHA-2-GLOBULIN: 7.2 % (ref 7.1–11.8)
Albumin ELP: 66.8 % — ABNORMAL HIGH (ref 55.8–66.1)
Beta 2: 3.9 % (ref 3.2–6.5)
Beta Globulin: 5.9 % (ref 4.7–7.2)
GAMMA GLOBULIN: 12.7 % (ref 11.1–18.8)
IgA: 141 mg/dL (ref 68–379)
IgG (Immunoglobin G), Serum: 838 mg/dL (ref 650–1600)
IgM, Serum: 60 mg/dL (ref 41–251)
TOTAL PROTEIN, SERUM ELECTROPHOR: 6.3 g/dL (ref 6.0–8.3)

## 2013-09-28 ENCOUNTER — Encounter: Payer: Self-pay | Admitting: Cardiology

## 2013-10-10 ENCOUNTER — Encounter: Payer: Self-pay | Admitting: Neurology

## 2013-10-10 ENCOUNTER — Encounter: Payer: Self-pay | Admitting: Internal Medicine

## 2013-10-11 ENCOUNTER — Other Ambulatory Visit: Payer: Self-pay | Admitting: Neurology

## 2013-10-12 NOTE — Telephone Encounter (Signed)
Mirapex .5 mg refill requested. Per last office note- patient to remain on medication, dosage changed to half a pill 3 times daily. Refill approved and sent to patient's pharmacy.

## 2013-10-14 NOTE — Telephone Encounter (Deleted)
I have updated the list.   Thank you for bringing it to our attention.   Happy Spring!!!

## 2013-10-27 ENCOUNTER — Encounter: Payer: Self-pay | Admitting: Neurology

## 2013-11-06 ENCOUNTER — Encounter: Payer: Self-pay | Admitting: Neurology

## 2013-11-07 ENCOUNTER — Encounter: Payer: Self-pay | Admitting: Neurology

## 2013-11-08 ENCOUNTER — Other Ambulatory Visit: Payer: Self-pay | Admitting: Internal Medicine

## 2013-11-08 ENCOUNTER — Other Ambulatory Visit: Payer: Self-pay | Admitting: Neurology

## 2013-11-08 DIAGNOSIS — I10 Essential (primary) hypertension: Secondary | ICD-10-CM

## 2013-11-09 NOTE — Telephone Encounter (Signed)
Refill for Toprol sent to [facility] on [address]

## 2013-11-09 NOTE — Telephone Encounter (Signed)
Carbidopa Levodopa 25/100 IR refill requested. Per last office note- patient to remain on medication. Refill approved and sent to patient's pharmacy.

## 2014-01-20 ENCOUNTER — Encounter: Payer: Self-pay | Admitting: Neurology

## 2014-01-20 ENCOUNTER — Ambulatory Visit (INDEPENDENT_AMBULATORY_CARE_PROVIDER_SITE_OTHER): Payer: BC Managed Care – PPO | Admitting: *Deleted

## 2014-01-20 ENCOUNTER — Ambulatory Visit (INDEPENDENT_AMBULATORY_CARE_PROVIDER_SITE_OTHER): Payer: BC Managed Care – PPO | Admitting: Neurology

## 2014-01-20 VITALS — BP 98/68 | HR 60 | Resp 18 | Ht 77.0 in | Wt 232.0 lb

## 2014-01-20 DIAGNOSIS — M25572 Pain in left ankle and joints of left foot: Secondary | ICD-10-CM

## 2014-01-20 DIAGNOSIS — Z23 Encounter for immunization: Secondary | ICD-10-CM

## 2014-01-20 DIAGNOSIS — G609 Hereditary and idiopathic neuropathy, unspecified: Secondary | ICD-10-CM

## 2014-01-20 DIAGNOSIS — G4700 Insomnia, unspecified: Secondary | ICD-10-CM

## 2014-01-20 DIAGNOSIS — M25579 Pain in unspecified ankle and joints of unspecified foot: Secondary | ICD-10-CM

## 2014-01-20 DIAGNOSIS — G20 Parkinson's disease: Secondary | ICD-10-CM

## 2014-01-20 MED ORDER — GABAPENTIN 300 MG PO CAPS: ORAL_CAPSULE | ORAL | Status: DC

## 2014-01-20 MED ORDER — PRAMIPEXOLE DIHYDROCHLORIDE 0.5 MG PO TABS: 0.2500 mg | ORAL_TABLET | Freq: Three times a day (TID) | ORAL | Status: DC

## 2014-01-20 NOTE — Progress Notes (Signed)
Jeff [name] was seen today in the movement disorders clinic for neurologic f/u for PD.  His wife reports that tremor began intermittently 10 years ago but PD was dx by Dr. Jacelyn Grip on 07/27/11.  The patient stated that he quit using the laser pointer in the R hand over the last year b/c of tremor.  His wife would note that tremor would occur at rest.  Tremor does not interfere with guitar playing and he does not notice if it he is using the [place].  He has noted that his R foot feels odd and stiff.  It is not painful.  11/24/2012 update:   He is on carbidopa/levodopa 25/100, 2 tablets 3 times a day.  His Mirapex is 0.5 mg 3 times a day.  He states that he is doing very well, although he does not know if he has noticed a big difference with the addition of Mirapex.  He is noting that he has an ache in the R hand.  He is learning to play guitar and feels that there is a tightness in it.  The dexterity in it seems okay.  He keeps time with the R foot and if he does not, he will note that it moves on its own.  He has some cramping in the R great toe but is unsure if it is related to dosing.    05/12/13:  The pt reports that 3 months ago, he bent down to catch something that was falling and it caused a hernia.  He is scheduled for hernia repair on Thursday.  He c/o R foot paresthesias and the top of the foot is hurting now and the R foot feels swollen.  He is starting to have paresthesias across the L foot but it doesn't hurt like the right.  He is having trouble staying asleep.  He gets up at 3 am and cannot fall back asleep.  However, he is having EDS.  He almost fell asleep driving.  He is still taking B12 supplement for the B12 deficiency.  He is planning on having surgery on Thursday for a hernia)  09/22/13 update:  The patient is following up regarding his Parkinson's disease, accompanied by his wife who helps to supplement the history.  The patient is currently on carbidopa/levodopa 25/100, 2 tablets 3 times per  day.  Last visit, his Mirapex was reduced and he is currently taking 0.5 mg, half tablet 3 times per day.  He does state that he no longer has the hypersomnolence after the reduction of the dose.  Last visit, he was complaining about paresthesias in the feet.  He subsequently had an EMG done by Dr. Posey Pronto, which revealed mild to moderate peripheral neuropathy bilaterally as well as a left L5 radiculopathy.  Because of symptoms, we started him on gabapentin.  Unfortunately, he experienced an increase in bladder symptoms and he ended up discontinuing the medication.  Today, however, the patient states that he is not so sure that the symptoms were from the Neurontin.  He wonders if it was a side effect from his hernia surgery and being catheterized.  He would like to retry the Neurontin.  He has not had any falls since our last visit.  No hallucinations.  No lightheadedness or near syncope.  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  01/20/14 update:  The patient returns today following up regarding his Parkinson's disease.  He is currently a carbidopa/levodopa 25/102 tablets 3 times per day, in addition to  his Mirapex 0.5 mg, half a tablet 3 times per day.  Has some dyskinesia of the R foot when playing the guitar.   I rechecked his B12 since last visit and it was 1041.  He had some other labs in regards to his peripheral neuropathy that were unremarkable.  RPR was negative.  There was no monoclonal protein/M spike in the serum or urinary protein electrophoresis.  Folate was normal.  We started him on Neurontin for the paresthesias.  He has worked up to 300 mg in the morning and 600 mg at night.  He is not sure that it was helpful but does state that the numbness "stopped spreading."  He states that last Thursday his left foot started hurting him on the plantar surface; it was not associated with any particular time of day.  He put inserts in the day and it helped.  He has an appt with a podiatrist on 9/2.  He  also c/o inability to sleep at night.  He gets up in the middle of the night to use the restroom and then watches the TV or uses FB.  His wife and another PD advocate told him not to do that and he has been doing better the last few nights.    PREVIOUS MEDICATIONS: Sinemet  ALLERGIES:  No Known Allergies  CURRENT MEDICATIONS:  Current Outpatient Prescriptions on File Prior to Visit  Medication Sig Dispense Refill  . amoxicillin (AMOXIL) 500 MG capsule Take 2,000 mg by mouth See admin instructions. Take 2 hours prior to dental work      . aspirin EC 325 MG tablet Take 325 mg by mouth daily.        . carbidopa-levodopa (SINEMET IR) 25-100 MG per tablet TAKE 2 TABLETS BY MOUTH THREE TIMES DAILY  540 tablet  3  . clobetasol cream (TEMOVATE) 7.82 % Apply 1 application topically 2 (two) times daily as needed (for skin irritation).      Marland Kitchen disopyramide (NORPACE) 150 MG capsule Take 1 capsule (150 mg total) by mouth 2 (two) times daily.  30 capsule  0  . metoprolol succinate (TOPROL-XL) 25 MG 24 hr tablet TAKE 1 TABLET BY MOUTH EVERY DAY  90 tablet  0  . simvastatin (ZOCOR) 40 MG tablet TAKE 1 TABLET BY MOUTH EVERY NIGHT AT BEDTIME  90 tablet  1  . vitamin B-12 (CYANOCOBALAMIN) 1000 MCG tablet Take 1,000 mcg by mouth daily.       No current facility-administered medications on file prior to visit.    PAST MEDICAL HISTORY:   Past Medical History  Diagnosis Date  . Hyperlipidemia     takes Simvastatin daily  . Hypertrophic obstructive cardiomyopathy     takes Amoxicillin prior to dental work and Metoprolol daily  . Psoriasis     uses Temovate cream as needed  . Allergic rhinitis   . Parkinsonism     takes Mirapex tid and Sinemet tid as well  . Heart murmur   . Atrial fibrillation 09/2008    hx of-takes Norpace daily  . Atrial flutter 09/2008    hx of  . Pneumonia     hx of-many years ago  . Numbness and tingling     both legs  . Peripheral edema     right foot-seeing Dr.Paz on Wed  about this  . Hemorrhoids   . Enlarged prostate but doesn't take any meds  . Cataract     immature but not sure which eye  . Insomnia  but no meds required    PAST SURGICAL HISTORY:   Past Surgical History  Procedure Laterality Date  . Colonoscopy      x 2  . Polypectomy    . Tonsillectomy      as a child  . Inguinal hernia repair Bilateral 05/14/2013    Procedure: LAPAROSCOPIC INGUINAL HERNIA REPAIR ;  Surgeon: Harl Bowie, MD;  Location: [place];  Service: General;  Laterality: Bilateral;  . Insertion of mesh Bilateral 05/14/2013    Procedure: INSERTION OF MESH;  Surgeon: Harl Bowie, MD;  Location: [place];  Service: General;  Laterality: Bilateral;  . Hernia repair      SOCIAL HISTORY:   History   Social History  . Marital Status: Married    Spouse Name: N/A    Number of Children: 0  . Years of Education: N/A   Occupational History  . [employer] and [employer] professor   Social History Main Topics  . Smoking status: Former Smoker    Types: Cigarettes  . Smokeless tobacco: Never Used     Comment: quit smoking > 57yr  . Alcohol Use: 1.5 oz/week    3 drink(s) per week     Comment: beer a couple of times a week  . Drug Use: Yes     Comment: Smokes marijuana once a week  . Sexual Activity: Yes   Other Topics Concern  . Not on file   Social History Narrative  . No narrative on file    FAMILY HISTORY:   Family Status  Relation Status Death Age  . Father Deceased     AD, pneumonia  . Mother Alive     healthy  . Brother Alive     2, HIV positive  . Sister Alive     healthy    ROS:  A complete 10 system review of systems was obtained and was unremarkable apart from what is mentioned above.  PHYSICAL EXAMINATION:    VITALS:   Filed Vitals:   01/20/14 0919  BP: 98/68  Pulse: 60  Resp: 18  Height: 6\' 5"  (1.956 m)  Weight: 232 lb (105.235 kg)    GEN:  The patient appears stated age and is in  NAD. HEENT:  Normocephalic, atraumatic.  The mucous membranes are moist. The superficial temporal arteries are without ropiness or tenderness. CV:  RRR with 3/6 SEM Lungs:  CTAB Neck/HEME:  There are no carotid bruits bilaterally.  Neurological examination:  Orientation: The patient is alert and oriented x3. Fund of knowledge is appropriate.  Recent and remote memory are intact.  Attention and concentration are normal.    Able to name objects and repeat phrases. Cranial nerves: There is good facial symmetry. There is minor facial hypomimia.  There is left ptosis.  Pupils are equal round and reactive to light bilaterally. Fundoscopic exam reveals clear margins bilaterally. Extraocular muscles are intact. The visual fields are full to confrontational testing. The speech is fluent and clear. Soft palate rises symmetrically and there is no tongue deviation. Hearing is intact to conversational tone.   Movement examination: Tone: There is normal tone in the upper and lower extremities today. Abnormal movements: There is no dyskinesia.  No tremor noted today Coordination:  There is no decremation with RAM's, including with alternating supination and pronation, hand opening and closing, heel taps, finger taps and toe taps. Gait and Station: The patient has no difficulty arising out of a deep-seated  chair without the use of the [place]. The patient's stride length is normal today and arm swing is good.   The patient has a negative pull test.    There is RUE tremor with ambulation.  LABS:  Lab Results  Component Value Date   WBC 6.2 05/11/2013   HGB 15.3 05/11/2013   HCT 44.0 05/11/2013   MCV 89.2 05/11/2013   PLT 144* 05/11/2013     Chemistry      Component Value Date/Time   NA 140 05/11/2013 1043   K 4.2 05/11/2013 1043   CL 103 05/11/2013 1043   CO2 27 05/11/2013 1043   BUN 13 05/11/2013 1043   CREATININE 1.00 05/11/2013 1043      Component Value Date/Time   CALCIUM 9.3 05/11/2013 1043    ALKPHOS 49 02/13/2013 1052   AST 36 02/13/2013 1052   ALT 19 02/13/2013 1052   BILITOT 0.9 02/13/2013 1052     Lab Results  Component Value Date   VITAMINB12 1041* 09/22/2013   Lab Results  Component Value Date   TSH 1.33 02/13/2013     ASSESSMENT/PLAN:  1.  Idiopathic, tremor predominant Parkinson's disease.  This is fairly early in onset, dx 07/2011.  This is evidenced by rigidity, tremor and minor bradykinesia.  He really has no postural instability.  I am seeing some minor motor fluctuations today, as evidenced by dyskinesia (very rare and minor).  -he will remain on his carbidopa/levodopa 25/100, 2 tablets 3 times a day.  - He will continue the Mirapex 0.5 mg, half a tablet 3 times per day.  The reduced dosage helped the hypersomnolence.  -Risks, benefits, side effects and alternative therapies were discussed.  The opportunity to ask questions was given and they were answered to the best of my ability.  The patient expressed understanding and willingness to follow the outlined treatment protocols.  -I encouraged CV exercise which he is doing.  He is also using an app called [employer] that uploads data to a trial and allow him to practice voice strength, memory, finger taps,etc.   2.  B12 deficiency.    -he is on oral B12 supplementation  3.  Peripheral neuropathy  -Labs work up for reversible causes was negative.  Safety discussed.  Neurontin started again and will work to 300 mg in the AM and 600 at night. 4.   L ptosis  -this is chronic and stable per pt. 5.  L foot pain  -Don't think related to PN or PD.  Has appt with podiatry.  Wonder if plantar fasciitis as has been walking a lot.   Told him to call me if not helpful and we will refer to Charlann Boxer. 6.  Insomnia  -talked about melatonin but pt wants to try behavioral modification first.  Trying to leave off TV when wakes up in the AM 7.  F/u 3-4 months.

## 2014-01-25 ENCOUNTER — Other Ambulatory Visit: Payer: Self-pay | Admitting: Cardiology

## 2014-01-27 ENCOUNTER — Ambulatory Visit (INDEPENDENT_AMBULATORY_CARE_PROVIDER_SITE_OTHER): Payer: BC Managed Care – PPO | Admitting: Podiatry

## 2014-01-27 ENCOUNTER — Ambulatory Visit (INDEPENDENT_AMBULATORY_CARE_PROVIDER_SITE_OTHER): Payer: BC Managed Care – PPO

## 2014-01-27 ENCOUNTER — Encounter: Payer: Self-pay | Admitting: Podiatry

## 2014-01-27 VITALS — BP 120/75 | HR 58 | Resp 13 | Ht 76.0 in | Wt 225.5 lb

## 2014-01-27 DIAGNOSIS — M79672 Pain in left foot: Secondary | ICD-10-CM

## 2014-01-27 DIAGNOSIS — M79609 Pain in unspecified limb: Secondary | ICD-10-CM

## 2014-01-27 DIAGNOSIS — M722 Plantar fascial fibromatosis: Secondary | ICD-10-CM

## 2014-01-27 NOTE — Progress Notes (Signed)
   Subjective:    Patient ID: Jeff [name], male    DOB: 1950-10-24, 63 y.o.   MRN: [id number]  HPI Comments: N foot pain L left heel and arch pain D 1.5 weeks O during a morning 3 mile walk C bruised sensation A old athletic shoes and exercise, occurs without and with weight bearing, and barefooted. T OTC inserts in the new shoes and compression socks  Foot Pain Associated symptoms include numbness.      Review of Systems  HENT: Positive for tinnitus.   Cardiovascular: Positive for leg swelling.  Neurological: Positive for tremors and numbness.  All other systems reviewed and are negative.      Objective:   Physical Exam  Orientated x3 white male  Vascular: DP and PT pulses 2/4 bilaterally Capillary reflex immediate bilaterally  Neurological: Sensation to 10 g monofilament wire intact 4/5 right and 1/5 left Vibratory sensation nonreactive bilaterally Ankle reflex equal and reactive bilaterally  Dermatological: Texture and turgor adequate bilaterally  Musculoskeletal: Manual motor testing lower extremity Foot plantar flexion, foot dorsi flexion, foot inversion, foot E. version 5/5 bilaterally Palpable tenderness in the medial plantar left heel in the fascial band without any palpable lesions Mild palpable tenderness mid medial fascial band left without any palpable lesions  X-ray examination left foot  Intact bony structure without fracture and/or dislocation  X-ray artifact noted on the lateral view  Posterior and inferior calcaneal spurs  Mild metatarsus adductus  Radiographic impression:  No acute bony abnormality noted in the left foot     Assessment & Plan:   Assessment: Plantar fasciitis insertional and mid arch left  Plan: Advised patient to DC walking for exercise and substitute biking or swimming Wear athletic style shoe Recommended stretching Fasciitis trap dispensed to wear and left foot in conjunction with over-the-counter arch  support  Reappoint at patient's request

## 2014-01-27 NOTE — Patient Instructions (Signed)
Plantar Fasciitis  Plantar fasciitis is a common condition that causes foot pain. It is soreness (inflammation) of the band of tough fibrous tissue on the bottom of the foot that runs from the heel bone (calcaneus) to the ball of the foot. The cause of this soreness may be from excessive standing, poor fitting shoes, running on hard surfaces, being overweight, having an abnormal walk, or overuse (this is common in runners) of the painful foot or feet. It is also common in aerobic exercise dancers and ballet dancers.  SYMPTOMS   Most people with plantar fasciitis complain of:   Severe pain in the morning on the bottom of their foot especially when taking the first steps out of bed. This pain recedes after a few minutes of walking.   Severe pain is experienced also during walking following a long period of inactivity.   Pain is worse when walking barefoot or up stairs  DIAGNOSIS    Your caregiver will diagnose this condition by examining and feeling your foot.   Special tests such as X-rays of your foot, are usually not needed.  PREVENTION    Consult a sports medicine professional before beginning a new exercise program.   Walking programs offer a good workout. With walking there is a lower chance of overuse injuries common to runners. There is less impact and less jarring of the joints.   Begin all new exercise programs slowly. If problems or pain develop, decrease the amount of time or distance until you are at a comfortable level.   Wear good shoes and replace them regularly.   Stretch your foot and the heel cords at the back of the ankle (Achilles tendon) both before and after exercise.   Run or exercise on even surfaces that are not hard. For example, asphalt is better than pavement.   Do not run barefoot on hard surfaces.   If using a treadmill, vary the incline.   Do not continue to workout if you have foot or joint problems. Seek professional help if they do not improve.  HOME CARE INSTRUCTIONS     Avoid activities that cause you pain until you recover.   Use ice or cold packs on the problem or painful areas after working out.   Only take over-the-counter or prescription medicines for pain, discomfort, or fever as directed by your caregiver.   Soft shoe inserts or athletic shoes with air or gel sole cushions may be helpful.   If problems continue or become more severe, consult a sports medicine caregiver or your own health care provider. Cortisone is a potent anti-inflammatory medication that may be injected into the painful area. You can discuss this treatment with your caregiver.  MAKE SURE YOU:    Understand these instructions.   Will watch your condition.   Will get help right away if you are not doing well or get worse.  Document Released: 02/06/2001 Document Revised: 08/06/2011 Document Reviewed: 04/07/2008  [employer] Patient Information 2015 [employer]. This information is not intended to replace advice given to you by your health care provider. Make sure you discuss any questions you have with your health care provider.

## 2014-02-07 ENCOUNTER — Other Ambulatory Visit: Payer: Self-pay | Admitting: Internal Medicine

## 2014-02-12 ENCOUNTER — Ambulatory Visit (INDEPENDENT_AMBULATORY_CARE_PROVIDER_SITE_OTHER): Payer: BC Managed Care – PPO | Admitting: Internal Medicine

## 2014-02-12 ENCOUNTER — Encounter: Payer: Self-pay | Admitting: Internal Medicine

## 2014-02-12 VITALS — BP 128/78 | HR 54 | Temp 97.6°F | Ht 77.0 in | Wt 227.5 lb

## 2014-02-12 DIAGNOSIS — Z0000 Encounter for general adult medical examination without abnormal findings: Secondary | ICD-10-CM

## 2014-02-12 NOTE — Assessment & Plan Note (Addendum)
Td ~ 2014 Shingles shot --02-2011 Had a Flu shot  Colonoscopy:   11-2006, adenomatous polyp, cscope again 2014, next 5 years diet, exercise discussed, doing well Has an abdominal bruit ---aorta and renal artery ultrasound (-) 2014 Labs Also complaining of decreased libido x years, will check a testosterone level

## 2014-02-12 NOTE — Progress Notes (Signed)
Pre visit review using our clinic review tool, if applicable. No additional management support is needed unless otherwise documented below in the visit note. 

## 2014-02-12 NOTE — Progress Notes (Signed)
Subjective:    Patient ID: Jeff [name], male    DOB: 1951/03/05, 63 y.o.   MRN: [id number]  DOS:  02/12/2014 Type of visit - description : CPX Interval history: feeling well    ROS Diet-- better x the last 2 weeks Exercise-- still active despite plantar fasciitis  No  CP, SOB; occ ant chest tightness x months, not exertional, occ triggered by bending forward No palpitations Denies  nausea, vomiting diarrhea, blood in the stools (-) cough, sputum production (-) wheezing, chest congestion No dysuria, gross hematuria, difficulty urinating  No anxiety, depression    Past Medical History  Diagnosis Date  . Hyperlipidemia     takes Simvastatin daily  . Hypertrophic obstructive cardiomyopathy     takes Amoxicillin prior to dental work and Metoprolol daily  . Psoriasis     uses Temovate cream as needed  . Allergic rhinitis   . Parkinsonism     takes Mirapex tid and Sinemet tid as well  . Heart murmur   . Atrial fibrillation 09/2008    hx of-takes Norpace daily  . Atrial flutter 09/2008    hx of  . Neuropathy   . Peripheral edema     right foot-seeing Dr.Marcelles Clinard on Wed about this  . Hemorrhoids   . Enlarged prostate but doesn't take any meds  . Cataract     immature but not sure which eye  . Insomnia     but no meds required    Past Surgical History  Procedure Laterality Date  . Colonoscopy      x 2  . Polypectomy    . Tonsillectomy      as a child  . Inguinal hernia repair Bilateral 05/14/2013    Procedure: LAPAROSCOPIC INGUINAL HERNIA REPAIR ;  Surgeon: Harl Bowie, MD;  Location: [facility];  Service: General;  Laterality: Bilateral;  . Insertion of mesh Bilateral 05/14/2013    Procedure: INSERTION OF MESH;  Surgeon: Harl Bowie, MD;  Location: [facility];  Service: General;  Laterality: Bilateral;    History   Social History  . Marital Status: Married    Spouse Name: Pamala Hurry    Number of Children: 0  . Years of Education: N/A   Occupational History    . [employer] and [employer] professor  .     Social History Main Topics  . Smoking status: Former Smoker    Types: Cigarettes  . Smokeless tobacco: Never Used     Comment: quit smoking > 15yr  . Alcohol Use: 1.5 oz/week    3 drink(s) per week     Comment: beer a couple of times a week  . Drug Use: Yes     Comment: Smokes marijuana once a week  . Sexual Activity: Yes   Other Topics Concern  . Not on file   Social History Narrative  . No narrative on file     Family History  Problem Relation Age of Onset  . Hypertension Neg Hx   . Stroke Neg Hx   . Colon cancer Neg Hx   . Prostate cancer Neg Hx   . Heart murmur Brother   . Alzheimer's disease Father   . Diabetes Father   . Heart disease Father        Medication List       This list is accurate as of: 02/12/14 11:59 PM.  Always use your most recent med list.  amoxicillin 500 MG capsule  Commonly known as:  AMOXIL  Take 2,000 mg by mouth See admin instructions. Take 2 hours prior to dental work     aspirin EC 325 MG tablet  Take 325 mg by mouth daily.     carbidopa-levodopa 25-100 MG per tablet  Commonly known as:  SINEMET IR  TAKE 2 TABLETS BY MOUTH THREE TIMES DAILY     clobetasol cream 0.05 %  Commonly known as:  TEMOVATE  Apply 1 application topically 2 (two) times daily as needed (for skin irritation).     disopyramide 150 MG capsule  Commonly known as:  NORPACE  TAKE 1 CAPSULE BY MOUTH TWICE DAILY     gabapentin 300 MG capsule  Commonly known as:  NEURONTIN  1 in the AM, 2 at bedtime     metoprolol succinate 25 MG 24 hr tablet  Commonly known as:  TOPROL-XL  TAKE 1 TABLET BY MOUTH EVERY DAY     pramipexole 0.5 MG tablet  Commonly known as:  MIRAPEX  Take 0.5 tablets (0.25 mg total) by mouth 3 (three) times daily.     simvastatin 40 MG tablet  Commonly known as:  ZOCOR  TAKE 1 TABLET BY MOUTH EVERY NIGHT AT BEDTIME     vitamin B-12 1000 MCG  tablet  Commonly known as:  CYANOCOBALAMIN  Take 1,000 mcg by mouth daily.           Objective:   Physical Exam BP 128/78  Pulse 54  Temp(Src) 97.6 F (36.4 C) (Oral)  Ht 6\' 5"  (1.956 m)  Wt 227 lb 8 oz (103.193 kg)  BMI 26.97 kg/m2  SpO2 98%  General -- alert, well-developed, NAD.  Neck --no thyromegaly  HEENT-- Not pale.  Lungs -- normal respiratory effort, no intercostal retractions, no accessory muscle use, and normal breath sounds.  Heart-- bradycarcic, +murmur.  Abdomen-- Not distended, good bowel sounds,soft, non-tender. Rectal-- No external abnormalities noted. Normal sphincter tone. No rectal masses or tenderness. Stool brown   Prostate--Prostate gland firm and smooth, no enlargement, nodularity, tenderness, mass, asymmetry or induration. Extremities-- no pretibial edema bilaterally  Neurologic--  alert & oriented X3. Speech normal, gait appropriate for age, strength symmetric and appropriate for age.  Psych-- Cognition and judgment appear intact. Cooperative with normal attention span and concentration. No anxious or depressed appearing.     Assessment & Plan:

## 2014-02-12 NOTE — Patient Instructions (Addendum)
Stop by the front desk and schedule labs to be done within few days (fasting):  FLP, BMP, AST, ALT, PSA, free,total testosterone ---- dx V70   Please come back to the office in 1 year  for a physical exam. Come back fasting

## 2014-02-15 ENCOUNTER — Other Ambulatory Visit: Payer: BC Managed Care – PPO

## 2014-02-17 ENCOUNTER — Other Ambulatory Visit (INDEPENDENT_AMBULATORY_CARE_PROVIDER_SITE_OTHER): Payer: BC Managed Care – PPO

## 2014-02-17 DIAGNOSIS — Z0000 Encounter for general adult medical examination without abnormal findings: Secondary | ICD-10-CM

## 2014-02-17 LAB — BASIC METABOLIC PANEL
BUN: 12 mg/dL (ref 6–23)
CO2: 29 meq/L (ref 19–32)
Calcium: 9.1 mg/dL (ref 8.4–10.5)
Chloride: 105 mEq/L (ref 96–112)
Creatinine, Ser: 1.1 mg/dL (ref 0.4–1.5)
GFR: 71.63 mL/min (ref >60.00)
Glucose, Bld: 97 mg/dL (ref 70–99)
POTASSIUM: 4.2 meq/L (ref 3.5–5.1)
SODIUM: 138 meq/L (ref 135–145)

## 2014-02-17 LAB — LIPID PANEL
Cholesterol: 141 mg/dL (ref 0–200)
HDL: 42.6 mg/dL (ref >39.00)
LDL Cholesterol: 76 mg/dL (ref 0–99)
NonHDL: 98.4
Total CHOL/HDL Ratio: 3
Triglycerides: 112 mg/dL (ref 0.0–149.0)
VLDL: 22.4 mg/dL (ref 0.0–40.0)

## 2014-02-17 LAB — ALT: ALT: 17 U/L (ref 0–53)

## 2014-02-17 LAB — PSA: PSA: 1.53 ng/mL (ref 0.10–4.00)

## 2014-02-17 LAB — AST: AST: 34 U/L (ref 0–37)

## 2014-02-18 LAB — TESTOSTERONE, FREE, TOTAL, SHBG
Sex Hormone Binding: 40 nmol/L (ref 13–71)
TESTOSTERONE FREE: 47 pg/mL (ref 47.0–244.0)
TESTOSTERONE: 272 ng/dL — AB (ref 300–890)
Testosterone-% Free: 1.7 % (ref 1.6–2.9)

## 2014-03-29 ENCOUNTER — Encounter: Payer: Self-pay | Admitting: *Deleted

## 2014-03-29 ENCOUNTER — Ambulatory Visit (INDEPENDENT_AMBULATORY_CARE_PROVIDER_SITE_OTHER): Payer: BC Managed Care – PPO | Admitting: Cardiology

## 2014-03-29 ENCOUNTER — Encounter: Payer: Self-pay | Admitting: Cardiology

## 2014-03-29 VITALS — BP 130/88 | HR 57 | Ht 77.0 in | Wt 233.0 lb

## 2014-03-29 DIAGNOSIS — I480 Paroxysmal atrial fibrillation: Secondary | ICD-10-CM

## 2014-03-29 DIAGNOSIS — I421 Obstructive hypertrophic cardiomyopathy: Secondary | ICD-10-CM

## 2014-03-29 NOTE — Assessment & Plan Note (Signed)
Patient apparently has had documented atrial fibrillation and flutter in the distant past. He has no embolic risk factors. His risk of embolic event is most likely increased because of hypertrophic cardiomyopathy. However given CHADS score 0, will treat with ASA. Continue disopyramide.

## 2014-03-29 NOTE — Progress Notes (Signed)
HPI: FU hypertrophic obstructive cardiomyopathy and atrial fibrillation. Renal Dopplers in September 2014 normal. Last echocardiogram October 2014 showed severe left ventricular hypertrophy (septum 21 mm) with normal LV function. The mean gradient across the left ventricular outflow tract was 37 mmHg. There was grade 2 diastolic dysfunction. Mild to moderate aortic insufficiency and mitral regurgitation. Severe left atrial enlargement. Holter monitor in November 2014 showed sinus rhythm with occasional PVC and couplet. One 3 beat run of nonsustained ventricular tachycardia. Exercise treadmill November 2014 showed no significant arrhythmias and normal increase in systolic blood pressure with exercise. Patient also apparently found to have atrial fibrillation on previous monitor. Since he was last seen, He denies dyspnea, chest pain patients or syncope.  Current Outpatient Prescriptions  Medication Sig Dispense Refill  . aspirin EC 325 MG tablet Take 325 mg by mouth daily.      . carbidopa-levodopa (SINEMET IR) 25-100 MG per tablet TAKE 2 TABLETS BY MOUTH THREE TIMES DAILY 540 tablet 3  . clobetasol cream (TEMOVATE) 3.74 % Apply 1 application topically 2 (two) times daily as needed (for skin irritation).    Marland Kitchen disopyramide (NORPACE) 150 MG capsule TAKE 1 CAPSULE BY MOUTH TWICE DAILY 180 capsule 0  . gabapentin (NEURONTIN) 300 MG capsule 1 in the AM, 2 at bedtime 270 capsule 3  . metoprolol succinate (TOPROL-XL) 25 MG 24 hr tablet TAKE 1 TABLET BY MOUTH EVERY DAY 90 tablet 1  . pramipexole (MIRAPEX) 0.5 MG tablet Take 0.5 tablets (0.25 mg total) by mouth 3 (three) times daily. 135 tablet 3  . simvastatin (ZOCOR) 40 MG tablet TAKE 1 TABLET BY MOUTH EVERY NIGHT AT BEDTIME 90 tablet 1  . vitamin B-12 (CYANOCOBALAMIN) 1000 MCG tablet Take 1,000 mcg by mouth daily.     No current facility-administered medications for this visit.     Past Medical History  Diagnosis Date  . Hyperlipidemia    takes Simvastatin daily  . Hypertrophic obstructive cardiomyopathy     takes Amoxicillin prior to dental work and Metoprolol daily  . Psoriasis     uses Temovate cream as needed  . Allergic rhinitis   . Parkinsonism     takes Mirapex tid and Sinemet tid as well  . Heart murmur   . Atrial fibrillation 09/2008    hx of-takes Norpace daily  . Atrial flutter 09/2008    hx of  . Neuropathy   . Peripheral edema     right foot-seeing Dr.Paz on Wed about this  . Hemorrhoids   . Enlarged prostate but doesn't take any meds  . Cataract     immature but not sure which eye  . Insomnia     but no meds required    Past Surgical History  Procedure Laterality Date  . Colonoscopy      x 2  . Polypectomy    . Tonsillectomy      as a child  . Inguinal hernia repair Bilateral 05/14/2013    Procedure: LAPAROSCOPIC INGUINAL HERNIA REPAIR ;  Surgeon: Harl Bowie, MD;  Location: [facility];  Service: General;  Laterality: Bilateral;  . Insertion of mesh Bilateral 05/14/2013    Procedure: INSERTION OF MESH;  Surgeon: Harl Bowie, MD;  Location: [facility];  Service: General;  Laterality: Bilateral;    History   Social History  . Marital Status: Married    Spouse Name: Pamala Hurry    Number of Children: 0  . Years of Education: N/A   Occupational History  .  [employer] and [employer] professor  .     Social History Main Topics  . Smoking status: Former Smoker    Types: Cigarettes  . Smokeless tobacco: Never Used     Comment: quit smoking > 40yr  . Alcohol Use: 1.5 oz/week    3 drink(s) per week     Comment: beer a couple of times a week  . Drug Use: Yes     Comment: Smokes marijuana once a week  . Sexual Activity: Yes   Other Topics Concern  . Not on file   Social History Narrative    ROS: no fevers or chills, productive cough, hemoptysis, dysphasia, odynophagia, melena, hematochezia, dysuria, hematuria, rash, seizure activity, orthopnea, PND, pedal  edema, claudication. Remaining systems are negative.  Physical Exam: Well-developed well-nourished in no acute distress.  Skin is warm and dry.  HEENT is normal.  Neck is supple.  Chest is clear to auscultation with normal expansion.  Cardiovascular exam is regular rate and rhythm. 2/6 systolic murmur left sternal border. Increases with Valsalva. Abdominal exam nontender or distended. No masses palpated. Extremities show no edema. neuro grossly intact  ECG Sinus rhythm at a rate of 57. First-degree AV block. Left bundle branch block.

## 2014-03-29 NOTE — Patient Instructions (Signed)

## 2014-03-29 NOTE — Assessment & Plan Note (Signed)
Patient remains asymptomatic. Continue present dose of beta blocker.He does not have a family history of sudden death, no history of syncope and his previous blood pressure increase with exercise. His septum is 21 mm. His only risk factor of sudden death is 3 beats of nonsustained ventricular tachycardia on previous monitor. I therefore do not think ICD is indicated at this point. We will plan to repeat his echocardiogram now. Repeat exercise treadmill and monitor when he returns in 1 year.

## 2014-04-07 ENCOUNTER — Ambulatory Visit (HOSPITAL_COMMUNITY): Admission: RE | Admit: 2014-04-07 | Payer: BC Managed Care – PPO | Source: Ambulatory Visit

## 2014-04-14 ENCOUNTER — Telehealth: Payer: Self-pay | Admitting: Neurology

## 2014-04-16 ENCOUNTER — Encounter: Payer: Self-pay | Admitting: Neurology

## 2014-04-17 ENCOUNTER — Other Ambulatory Visit: Payer: Self-pay | Admitting: Cardiology

## 2014-05-06 ENCOUNTER — Encounter: Payer: Self-pay | Admitting: Cardiology

## 2014-05-09 ENCOUNTER — Encounter: Payer: Self-pay | Admitting: Internal Medicine

## 2014-05-09 ENCOUNTER — Other Ambulatory Visit: Payer: Self-pay | Admitting: Internal Medicine

## 2014-05-10 ENCOUNTER — Ambulatory Visit (INDEPENDENT_AMBULATORY_CARE_PROVIDER_SITE_OTHER): Payer: BC Managed Care – PPO | Admitting: Physician Assistant

## 2014-05-10 ENCOUNTER — Encounter: Payer: Self-pay | Admitting: Physician Assistant

## 2014-05-10 ENCOUNTER — Ambulatory Visit (HOSPITAL_BASED_OUTPATIENT_CLINIC_OR_DEPARTMENT_OTHER)
Admission: RE | Admit: 2014-05-10 | Discharge: 2014-05-10 | Disposition: A | Discharge status: Home / Self Care | Payer: BC Managed Care – PPO | Source: Ambulatory Visit | Attending: Physician Assistant | Admitting: Physician Assistant

## 2014-05-10 ENCOUNTER — Telehealth: Payer: Self-pay | Admitting: Physician Assistant

## 2014-05-10 VITALS — BP 116/69 | HR 58 | Temp 98.5°F | Resp 16 | Ht 77.0 in | Wt 237.0 lb

## 2014-05-10 DIAGNOSIS — S62102A Fracture of unspecified carpal bone, left wrist, initial encounter for closed fracture: Secondary | ICD-10-CM

## 2014-05-10 DIAGNOSIS — W19XXXA Unspecified fall, initial encounter: Secondary | ICD-10-CM

## 2014-05-10 DIAGNOSIS — R0781 Pleurodynia: Secondary | ICD-10-CM | POA: Insufficient documentation

## 2014-05-10 DIAGNOSIS — S62111A Displaced fracture of triquetrum [cuneiform] bone, right wrist, initial encounter for closed fracture: Secondary | ICD-10-CM | POA: Diagnosis not present

## 2014-05-10 DIAGNOSIS — M25531 Pain in right wrist: Secondary | ICD-10-CM | POA: Diagnosis present

## 2014-05-10 DIAGNOSIS — T1490 Injury, unspecified: Secondary | ICD-10-CM

## 2014-05-10 NOTE — Progress Notes (Signed)
Pre visit review using our clinic review tool, if applicable. No additional management support is needed unless otherwise documented below in the visit note/SLS  

## 2014-05-10 NOTE — Patient Instructions (Signed)
Please go downstairs for imaging. I will call you with your results. Please take Ibuprofen as directed.   I would use a baby aspirin (81 mg) instead of regular dose while on Ibuprofen.   RICE: Routine Care for Injuries The routine care of many injuries includes Rest, Ice, Compression, and Elevation (RICE). HOME CARE INSTRUCTIONS  Rest is needed to allow your body to heal. Routine activities can usually be resumed when comfortable. Injured tendons and bones can take up to 6 weeks to heal. Tendons are the cord-like structures that attach muscle to bone.  Ice following an injury helps keep the swelling down and reduces pain.  Put ice in a plastic bag.  Place a towel between your skin and the bag.  Leave the ice on for 15-20 minutes, 3-4 times a day, or as directed by your health care provider. Do this while awake, for the first 24 to 48 hours. After that, continue as directed by your caregiver.  Compression helps keep swelling down. It also gives support and helps with discomfort. If an elastic bandage has been applied, it should be removed and reapplied every 3 to 4 hours. It should not be applied tightly, but firmly enough to keep swelling down. Watch fingers or toes for swelling, bluish discoloration, coldness, numbness, or excessive pain. If any of these problems occur, remove the bandage and reapply loosely. Contact your caregiver if these problems continue.  Elevation helps reduce swelling and decreases pain. With extremities, such as the arms, Cheeks, legs, and feet, the injured area should be placed near or above the level of the heart, if possible. SEEK IMMEDIATE MEDICAL CARE IF:  You have persistent pain and swelling.  You develop redness, numbness, or unexpected weakness.  Your symptoms are getting worse rather than improving after several days. These symptoms may indicate that further evaluation or further X-rays are needed. Sometimes, X-rays may not show a small broken bone  (fracture) until 1 week or 10 days later. Make a follow-up appointment with your caregiver. Ask when your X-ray results will be ready. Make sure you get your X-ray results. Document Released: 08/26/2000 Document Revised: 05/19/2013 Document Reviewed: 10/13/2010 [facility] Patient Information 2015 [place], [place]. This information is not intended to replace advice given to you by your health care provider. Make sure you discuss any questions you have with your health care provider.  I will call you with all of your results.

## 2014-05-10 NOTE — Assessment & Plan Note (Signed)
Will obtain x-ray of ribs and right wrist to assess for fracture.  RICE therapy discussed.  Ibuprofen for pain.  Dosing instructions given regarding Aspirin while on high-dose NSAIDs.  Continue compression garment for chest.  Follow-up will be based on imaging studies.

## 2014-05-10 NOTE — Telephone Encounter (Signed)
Pt aware of xray results/going to [facility] urgent care tonight

## 2014-05-10 NOTE — Telephone Encounter (Signed)
No rib fracture noted.  There is a wrist fracture or right wrist present. I am setting up an urgent referral to Orthopedic Surgery for them to cast his wrist.     Jeff [name] -- Can we call the [facility] after hours clinic and get appointment for patient?
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
## 2014-05-13 ENCOUNTER — Encounter (HOSPITAL_COMMUNITY): Payer: Self-pay | Admitting: Emergency Medicine

## 2014-05-13 ENCOUNTER — Emergency Department (HOSPITAL_COMMUNITY)
Admission: EM | Admit: 2014-05-13 | Discharge: 2014-05-14 | Disposition: A | Discharge status: Home / Self Care | Payer: BC Managed Care – PPO | Attending: Emergency Medicine | Admitting: Emergency Medicine

## 2014-05-13 DIAGNOSIS — Z8719 Personal history of other diseases of the digestive system: Secondary | ICD-10-CM | POA: Insufficient documentation

## 2014-05-13 DIAGNOSIS — Z872 Personal history of diseases of the skin and subcutaneous tissue: Secondary | ICD-10-CM | POA: Insufficient documentation

## 2014-05-13 DIAGNOSIS — H6122 Impacted cerumen, left ear: Secondary | ICD-10-CM | POA: Diagnosis not present

## 2014-05-13 DIAGNOSIS — I4891 Unspecified atrial fibrillation: Secondary | ICD-10-CM | POA: Diagnosis not present

## 2014-05-13 DIAGNOSIS — Z87891 Personal history of nicotine dependence: Secondary | ICD-10-CM | POA: Diagnosis not present

## 2014-05-13 DIAGNOSIS — W19XXXA Unspecified fall, initial encounter: Secondary | ICD-10-CM

## 2014-05-13 DIAGNOSIS — E785 Hyperlipidemia, unspecified: Secondary | ICD-10-CM | POA: Diagnosis not present

## 2014-05-13 DIAGNOSIS — G20 Parkinson's disease: Secondary | ICD-10-CM | POA: Diagnosis not present

## 2014-05-13 DIAGNOSIS — Y998 Other external cause status: Secondary | ICD-10-CM | POA: Insufficient documentation

## 2014-05-13 DIAGNOSIS — H9222 Otorrhagia, left ear: Secondary | ICD-10-CM | POA: Insufficient documentation

## 2014-05-13 DIAGNOSIS — Y92410 Unspecified street and highway as the place of occurrence of the external cause: Secondary | ICD-10-CM | POA: Diagnosis not present

## 2014-05-13 DIAGNOSIS — Y9389 Activity, other specified: Secondary | ICD-10-CM | POA: Insufficient documentation

## 2014-05-13 DIAGNOSIS — G629 Polyneuropathy, unspecified: Secondary | ICD-10-CM | POA: Insufficient documentation

## 2014-05-13 DIAGNOSIS — S0219XA Other fracture of base of skull, initial encounter for closed fracture: Secondary | ICD-10-CM | POA: Diagnosis not present

## 2014-05-13 DIAGNOSIS — G4700 Insomnia, unspecified: Secondary | ICD-10-CM | POA: Diagnosis not present

## 2014-05-13 DIAGNOSIS — R011 Cardiac murmur, unspecified: Secondary | ICD-10-CM | POA: Diagnosis not present

## 2014-05-13 DIAGNOSIS — Z79899 Other long term (current) drug therapy: Secondary | ICD-10-CM | POA: Insufficient documentation

## 2014-05-13 DIAGNOSIS — S0990XA Unspecified injury of head, initial encounter: Secondary | ICD-10-CM | POA: Diagnosis present
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
## 2014-05-14 ENCOUNTER — Ambulatory Visit (INDEPENDENT_AMBULATORY_CARE_PROVIDER_SITE_OTHER): Payer: BC Managed Care – PPO | Admitting: Internal Medicine

## 2014-05-14 ENCOUNTER — Encounter: Payer: Self-pay | Admitting: Internal Medicine

## 2014-05-14 ENCOUNTER — Emergency Department (HOSPITAL_COMMUNITY): Payer: BC Managed Care – PPO

## 2014-05-14 VITALS — BP 125/75 | HR 78 | Temp 98.2°F | Wt 236.5 lb

## 2014-05-14 DIAGNOSIS — T1490 Injury, unspecified: Secondary | ICD-10-CM

## 2014-05-14 DIAGNOSIS — W19XXXA Unspecified fall, initial encounter: Secondary | ICD-10-CM

## 2014-05-14 DIAGNOSIS — S0219XD Other fracture of base of skull, subsequent encounter for fracture with routine healing: Secondary | ICD-10-CM

## 2014-05-14 MED ORDER — HYDROCODONE-ACETAMINOPHEN 5-325 MG PO TABS: 1.0000 | ORAL_TABLET | Freq: Three times a day (TID) | ORAL | Status: DC | PRN

## 2014-05-14 MED ORDER — HYDROCODONE-ACETAMINOPHEN 5-325 MG PO TABS
2.0000 | ORAL_TABLET | Freq: Once | ORAL | Status: AC
Start: 2014-05-14 — End: 2014-05-14
  Administered 2014-05-14: 2 via ORAL
  Filled 2014-05-14: qty 2
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
## 2014-05-14 NOTE — Discharge Instructions (Signed)
Facial Fracture Jeff [name], you were seen today for pain in your right ear. CT scan results are below and show a small fracture. Take pain medication as prescribed and follow-up with ENT for continued management. If your symptoms worsen come back to the emergency department immediately for repeat evaluation. Thank you.   Linear lucency is seen involving the anterior and inferior aspect of the external auditory canal within the temporal bone consistent with nondisplaced fracture. Reportedly, the patient is symptomatic in this area.  A facial fracture is a break in one of the bones of your face. HOME CARE INSTRUCTIONS   Protect the injured part of your face until it is healed.  Do not participate in activities which give chance for re-injury until your doctor approves.  Gently wash and dry your face.  Wear head and facial protection while riding a bicycle, motorcycle, or snowmobile. SEEK MEDICAL CARE IF:   An oral temperature above 102 F (38.9 C) develops.  You have severe headaches or notice changes in your vision.  You have new numbness or tingling in your face.  You develop nausea (feeling sick to your stomach), vomiting or a stiff neck. SEEK IMMEDIATE MEDICAL CARE IF:   You develop difficulty seeing or experience double vision.  You become dizzy, lightheaded, or faint.  You develop trouble speaking, breathing, or swallowing.  You have a watery discharge from your nose or ear. MAKE SURE YOU:   Understand these instructions.  Will watch your condition.  Will get help right away if you are not doing well or get worse. Document Released: 05/14/2005 Document Revised: 08/06/2011 Document Reviewed: 01/01/2008 [facility] Patient Information 2015 [place], [place]. This information is not intended to replace advice given to you by your health care provider. Make sure you discuss any questions you have with your health care provider.
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
## 2014-05-14 NOTE — Progress Notes (Signed)
Pre visit review using our clinic review tool, if applicable. No additional management support is needed unless otherwise documented below in the visit note. 

## 2014-05-14 NOTE — Patient Instructions (Signed)
See the ENT doctor as schedule

## 2014-05-31 ENCOUNTER — Telehealth: Payer: Self-pay | Admitting: Neurology

## 2014-05-31 NOTE — Telephone Encounter (Signed)
Pt would like to touch base on an event he will be participating with Dr. Carles Collet on 08/06/14. He would like to speak to Dr. Carles Collet.  Jade please call pt.  C/b [phone]

## 2014-05-31 NOTE — Telephone Encounter (Signed)
I just got the agenda today and will forward that to you, Jade.  He will be involved with a patient panel (him and one other pt).  Will only be about 20 minutes total and probably each pt will get about 10 mins to speak and for him, want him to talk about life with PD and life with med.  We can certainly talk about it in person if he wants?

## 2014-05-31 NOTE — Telephone Encounter (Signed)
Did you want to talk with him about this?

## 2014-06-01 NOTE — Telephone Encounter (Signed)
Left message on machine for patient to call back.

## 2014-06-02 NOTE — Telephone Encounter (Signed)
Mychart message sent to patient. He is to call with any questions.

## 2014-06-10 ENCOUNTER — Telehealth: Payer: Self-pay | Admitting: Internal Medicine

## 2014-06-10 MED ORDER — HYDROCODONE-ACETAMINOPHEN 5-325 MG PO TABS: 1.0000 | ORAL_TABLET | Freq: Three times a day (TID) | ORAL | Status: DC | PRN

## 2014-06-10 NOTE — Telephone Encounter (Signed)
Done, if rx becomes long term, will need uds

## 2014-06-10 NOTE — Telephone Encounter (Signed)
Caller name:Whittle, Doye Relation to QJ:JHER Call back [phone] Pharmacy:  Reason for call: pt is needing rx  HYDROcodone-acetaminophen (NORCO/VICODIN) 5-325 MG per tablet  Please call when available for pick

## 2014-06-10 NOTE — Telephone Encounter (Signed)
Pt is requesting refill on Hydrocodone.   Last OV: 05/14/2014 Last Fill: 05/14/2014 # [phone] [id number] UDS: None  Please advise.

## 2014-06-10 NOTE — Telephone Encounter (Signed)
LMOM informing Pt that rx is ready for pick up at front desk.

## 2014-07-08 NOTE — Telephone Encounter (Signed)
error 

## 2014-07-22 ENCOUNTER — Ambulatory Visit: Payer: BC Managed Care – PPO | Admitting: Neurology

## 2014-07-26 ENCOUNTER — Encounter: Payer: Self-pay | Admitting: Neurology

## 2014-07-26 ENCOUNTER — Ambulatory Visit (INDEPENDENT_AMBULATORY_CARE_PROVIDER_SITE_OTHER): Payer: BC Managed Care – PPO | Admitting: Neurology

## 2014-07-26 VITALS — BP 102/80 | HR 60 | Ht 77.0 in | Wt 233.0 lb

## 2014-07-26 DIAGNOSIS — E538 Deficiency of other specified B group vitamins: Secondary | ICD-10-CM

## 2014-07-26 DIAGNOSIS — G20 Parkinson's disease: Secondary | ICD-10-CM

## 2014-07-26 MED ORDER — CARBIDOPA-LEVODOPA ER 50-200 MG PO TBCR
1.0000 | EXTENDED_RELEASE_TABLET | Freq: Every day | ORAL | Status: DC
Start: 2014-07-26 — End: 2015-01-24

## 2014-07-26 NOTE — Addendum Note (Signed)
Addended byAnnamaria Helling on: 07/26/2014 02:13 PM   Modules accepted: Orders
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
## 2014-08-01 ENCOUNTER — Other Ambulatory Visit: Payer: Self-pay | Admitting: Internal Medicine

## 2014-08-18 ENCOUNTER — Encounter: Payer: Self-pay | Admitting: Cardiology

## 2014-09-24 ENCOUNTER — Ambulatory Visit (INDEPENDENT_AMBULATORY_CARE_PROVIDER_SITE_OTHER): Payer: BC Managed Care – PPO | Admitting: Podiatry

## 2014-09-24 ENCOUNTER — Encounter: Payer: Self-pay | Admitting: Podiatry

## 2014-09-24 ENCOUNTER — Ambulatory Visit (INDEPENDENT_AMBULATORY_CARE_PROVIDER_SITE_OTHER): Payer: BC Managed Care – PPO

## 2014-09-24 VITALS — BP 131/51 | HR 68 | Resp 12 | Ht 77.0 in | Wt 226.0 lb

## 2014-09-24 DIAGNOSIS — M722 Plantar fascial fibromatosis: Secondary | ICD-10-CM

## 2014-09-24 MED ORDER — TRIAMCINOLONE ACETONIDE 10 MG/ML IJ SUSP
10.0000 mg | Freq: Once | INTRAMUSCULAR | Status: AC
  Administered 2014-09-24: 10 mg

## 2014-09-24 NOTE — Progress Notes (Signed)
   Subjective:    Patient ID: Jeff [name], male    DOB: 1951/04/12, 64 y.o.   MRN: [id number]  HPI    Review of Systems  All other systems reviewed and are negative.      Objective:   Physical Exam        Assessment & Plan:

## 2014-09-25 NOTE — Progress Notes (Signed)
Subjective:     Patient ID: Jeff [name], male   DOB: Jul 12, 1950, 64 y.o.   MRN: [id number]  HPI patient states I have a lot of pain in my right arch and it makes it hard at times to stand which I need to do for work. States that it's been getting him for about 2 months   Review of Systems     Objective:   Physical Exam Neurovascular status is noted to be intact and there is quite a bit of discomfort in the distal fascia right medial side with inflammation noted and the pain seems to be worse after periods of sitting or when getting up in the morning    Assessment:     Distal plantar fasciitis right with inflammation and fluid and moderate depression of the arch noted    Plan:     H&P and x-rays reviewed with patient. Careful distal injection administered 3 mg Kenalog 5 mill grams Xylocaine and night splint dispensed with instructions on usage and instructions on heat and ice treatment. Patient be seen back and may require ultimate orthotic treatment

## 2014-10-21 ENCOUNTER — Encounter: Payer: Self-pay | Admitting: Neurology

## 2014-10-26 ENCOUNTER — Ambulatory Visit (INDEPENDENT_AMBULATORY_CARE_PROVIDER_SITE_OTHER): Payer: BC Managed Care – PPO | Admitting: Neurology

## 2014-10-26 ENCOUNTER — Encounter: Payer: Self-pay | Admitting: Neurology

## 2014-10-26 VITALS — BP 142/66 | HR 80 | Ht 77.0 in | Wt 233.0 lb

## 2014-10-26 DIAGNOSIS — G629 Polyneuropathy, unspecified: Secondary | ICD-10-CM | POA: Diagnosis not present

## 2014-10-26 DIAGNOSIS — M722 Plantar fascial fibromatosis: Secondary | ICD-10-CM | POA: Diagnosis not present

## 2014-10-26 DIAGNOSIS — G20 Parkinson's disease: Secondary | ICD-10-CM | POA: Diagnosis not present

## 2014-10-26 DIAGNOSIS — E538 Deficiency of other specified B group vitamins: Secondary | ICD-10-CM | POA: Diagnosis not present
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
## 2014-10-28 ENCOUNTER — Encounter: Payer: Self-pay | Admitting: Cardiology

## 2014-10-29 ENCOUNTER — Encounter: Payer: Self-pay | Admitting: Podiatry

## 2014-10-29 ENCOUNTER — Encounter: Payer: Self-pay | Admitting: *Deleted

## 2014-10-29 ENCOUNTER — Ambulatory Visit (INDEPENDENT_AMBULATORY_CARE_PROVIDER_SITE_OTHER): Payer: BC Managed Care – PPO | Admitting: Podiatry

## 2014-10-29 VITALS — BP 89/55 | HR 70 | Resp 14

## 2014-10-29 DIAGNOSIS — M722 Plantar fascial fibromatosis: Secondary | ICD-10-CM | POA: Diagnosis not present

## 2014-10-29 MED ORDER — MELOXICAM 15 MG PO TABS: 15.0000 mg | ORAL_TABLET | Freq: Every day | ORAL | Status: DC

## 2014-10-31 ENCOUNTER — Encounter: Payer: Self-pay | Admitting: Neurology

## 2014-11-01 NOTE — Progress Notes (Signed)
Subjective:     Patient ID: Jeff [name], male   DOB: 1951/04/23, 64 y.o.   MRN: [id number]  HPI patient presents stating I'm still having a lot of pain in my heel and arch. States that he's been walking okay but that the pain continues to be quite intense with palpation   Review of Systems     Objective:   Physical Exam Neurovascular status intact muscle strength adequate with range of motion within normal limits. Patient's noted to have continued discomfort in the plantar heel with fluid buildup noted around the medial band and into the arch with discomfort upon palpation    Assessment:     Continued chronic plantar fascial-like symptomatology that's failed so far to respond to conservative care    Plan:     At this point where cannot completely immobilize to try to take all pressure off this as it hurts more with weightbearing and is placed into a air fracture walker with all instructions on usage. Also we will continue home physical therapy oral anti-inflammatory is and at this time is to orthotics to reduce all stress against his heel

## 2014-11-15 ENCOUNTER — Other Ambulatory Visit: Payer: Self-pay | Admitting: Neurology

## 2014-11-18 ENCOUNTER — Ambulatory Visit (INDEPENDENT_AMBULATORY_CARE_PROVIDER_SITE_OTHER): Payer: BC Managed Care – PPO | Admitting: Podiatry

## 2014-11-18 ENCOUNTER — Encounter: Payer: Self-pay | Admitting: Podiatry

## 2014-11-18 VITALS — BP 132/82 | HR 58 | Resp 12

## 2014-11-18 DIAGNOSIS — M722 Plantar fascial fibromatosis: Secondary | ICD-10-CM

## 2014-11-18 NOTE — Progress Notes (Signed)
   Subjective:    Patient ID: Jeff [name], male    DOB: [birth year], 64 y.o.   MRN: [id number]  HPI  Patient states that the pain in R heel has improved but still giving him some discomfort. He also received a new set of orthotics and was asking about how they would fit in dress shoes as opposed to his leisurely shoes.  Review of Systems     Objective:   Physical Exam        Assessment & Plan:

## 2014-11-20 NOTE — Progress Notes (Signed)
Subjective:     Patient ID: Jeff [name], male   DOB: 11/20/1950, 64 y.o.   MRN: [id number]  HPI patient states my right heel is doing pretty well with pain still present upon palpation but improved from previous visit   Review of Systems     Objective:   Physical Exam Plantar fasciitis right which doing much better with discomfort still present but improved    Assessment:     Reviewed the plantar fascial symptomatology he has    Plan:     Dispensed orthotics at this time with instructions and continue physical therapy supportive shoe and reappoint if symptoms persist for further more aggressive treatment including possibility for shockwave

## 2015-01-24 ENCOUNTER — Other Ambulatory Visit: Payer: Self-pay | Admitting: Neurology

## 2015-01-24 NOTE — Telephone Encounter (Signed)
Carbidopa Levodopa 50/200 refill requested. Per last office note- patient to remain on medication. Refill approved and sent to patient's pharmacy.

## 2015-01-26 ENCOUNTER — Ambulatory Visit (INDEPENDENT_AMBULATORY_CARE_PROVIDER_SITE_OTHER): Payer: BC Managed Care – PPO | Admitting: Neurology

## 2015-01-26 ENCOUNTER — Encounter: Payer: Self-pay | Admitting: Neurology

## 2015-01-26 VITALS — BP 140/88 | HR 58 | Ht 77.0 in | Wt 229.0 lb

## 2015-01-26 DIAGNOSIS — G20 Parkinson's disease: Secondary | ICD-10-CM | POA: Diagnosis not present

## 2015-01-26 DIAGNOSIS — G629 Polyneuropathy, unspecified: Secondary | ICD-10-CM

## 2015-01-26 MED ORDER — CARBIDOPA-LEVODOPA 25-100 MG PO TABS: 2.0000 | ORAL_TABLET | Freq: Three times a day (TID) | ORAL | Status: DC

## 2015-01-26 MED ORDER — GABAPENTIN 300 MG PO CAPS: ORAL_CAPSULE | ORAL | Status: DC

## 2015-01-26 MED ORDER — PRAMIPEXOLE DIHYDROCHLORIDE 0.5 MG PO TABS: 0.2500 mg | ORAL_TABLET | Freq: Three times a day (TID) | ORAL | Status: DC

## 2015-01-26 MED ORDER — CARBIDOPA-LEVODOPA ER 50-200 MG PO TBCR: 1.0000 | EXTENDED_RELEASE_TABLET | Freq: Every day | ORAL | Status: DC
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
## 2015-01-26 NOTE — Patient Instructions (Signed)
1.  Add melatonin - 3mg  at night to your nightly regimen.  Melatonin has been well studied in parkinsons disease in dosages from 3mg  to 8 mg

## 2015-02-11 ENCOUNTER — Ambulatory Visit (INDEPENDENT_AMBULATORY_CARE_PROVIDER_SITE_OTHER): Payer: Medicare Other | Admitting: Internal Medicine

## 2015-02-11 ENCOUNTER — Encounter: Payer: Self-pay | Admitting: Internal Medicine

## 2015-02-11 VITALS — BP 124/78 | HR 64 | Temp 97.5°F | Ht 77.0 in | Wt 229.0 lb

## 2015-02-11 DIAGNOSIS — Z09 Encounter for follow-up examination after completed treatment for conditions other than malignant neoplasm: Secondary | ICD-10-CM

## 2015-02-11 DIAGNOSIS — Z23 Encounter for immunization: Secondary | ICD-10-CM | POA: Diagnosis not present

## 2015-02-11 DIAGNOSIS — E785 Hyperlipidemia, unspecified: Secondary | ICD-10-CM | POA: Diagnosis not present

## 2015-02-11 DIAGNOSIS — R21 Rash and other nonspecific skin eruption: Secondary | ICD-10-CM | POA: Diagnosis not present

## 2015-02-11 NOTE — Patient Instructions (Signed)
  If you need more information about a healthy diet,  visit  the American Heart Association, it  is a Financial planner at:  http://www.richard-flynn.net/  All about diabetes, great resource!  InsuranceTransaction.co.za.html

## 2015-02-11 NOTE — Progress Notes (Signed)
Subjective:    Patient ID: Jeff [name], male    DOB: 15-Sep-1950, 64 y.o.   MRN: [id number]  DOS:  02/11/2015 Type of visit - description : Acute visit Interval history: Had a rash on the dorsum of the right foot for to 3 weeks, did not itch or hurt. It is resolved now. Likes me to look at the area. High cholesterol: good compliance w/ meds but likes to decrease the amount of medicines he takes , specifically d/c statins?.   Review of Systems No other concerns  Past Medical History  Diagnosis Date  . Hyperlipidemia     takes Simvastatin daily  . Hypertrophic obstructive cardiomyopathy     takes Amoxicillin prior to dental work and Metoprolol daily  . Psoriasis     uses Temovate cream as needed  . Allergic rhinitis   . Parkinsonism     takes Mirapex tid and Sinemet tid as well  . Heart murmur   . Atrial fibrillation 09/2008    hx of-takes Norpace daily  . Atrial flutter 09/2008    hx of  . Neuropathy   . Peripheral edema     right foot-seeing Dr.Paz on Wed about this  . Hemorrhoids   . Enlarged prostate but doesn't take any meds  . Cataract     immature but not sure which eye  . Insomnia     but no meds required    Past Surgical History  Procedure Laterality Date  . Colonoscopy      x 2  . Polypectomy    . Tonsillectomy      as a child  . Inguinal hernia repair Bilateral 05/14/2013    Procedure: LAPAROSCOPIC INGUINAL HERNIA REPAIR ;  Surgeon: Harl Bowie, MD;  Location: [facility];  Service: General;  Laterality: Bilateral;  . Insertion of mesh Bilateral 05/14/2013    Procedure: INSERTION OF MESH;  Surgeon: Harl Bowie, MD;  Location: [facility];  Service: General;  Laterality: Bilateral;    Social History   Social History  . Marital Status: Married    Spouse Name: Pamala Hurry  . Number of Children: 0  . Years of Education: N/A   Occupational History  . [employer] and [employer] professor  .     Social History Main Topics   . Smoking status: Former Smoker    Types: Cigarettes  . Smokeless tobacco: Never Used     Comment: quit smoking  in the 70s  . Alcohol Use: 1.8 oz/week    3 Standard drinks or equivalent per week     Comment: beer a couple of times a week  . Drug Use: Yes     Comment: Smokes marijuana once a week  . Sexual Activity: Yes   Other Topics Concern  . Not on file   Social History Narrative        Medication List       This list is accurate as of: 02/11/15  5:09 PM.  Always use your most recent med list.               amoxicillin 500 MG capsule  Commonly known as:  AMOXIL  Take 2,000 mg by mouth See admin instructions. Before dental work     aspirin EC 325 MG tablet  Take 325 mg by mouth daily.     carbidopa-levodopa 50-200 MG per tablet  Commonly known as:  SINEMET CR  Take 1 tablet by  mouth at bedtime.     carbidopa-levodopa 25-100 MG per tablet  Commonly known as:  SINEMET IR  Take 2 tablets by mouth 3 (three) times daily.     clobetasol cream 0.05 %  Commonly known as:  TEMOVATE  Apply 1 application topically 2 (two) times daily as needed (for skin irritation).     disopyramide 150 MG capsule  Commonly known as:  NORPACE  Take 150 mg by mouth 2 (two) times daily.     gabapentin 300 MG capsule  Commonly known as:  NEURONTIN  1 in the AM, 2 at bedtime     Melatonin 3 MG Caps  Take 1 capsule by mouth at bedtime.     metoprolol succinate 25 MG 24 hr tablet  Commonly known as:  TOPROL-XL  Take 25 mg by mouth daily.     pramipexole 0.5 MG tablet  Commonly known as:  MIRAPEX  Take 0.5 tablets (0.25 mg total) by mouth 3 (three) times daily.     RENU SALINE Soln  Place 2 drops into both eyes as needed (dry eyes).     vitamin B-12 1000 MCG tablet  Commonly known as:  CYANOCOBALAMIN  Take 1,000 mcg by mouth daily.           Objective:   Physical Exam BP 124/78 mmHg  Pulse 64  Temp(Src) 97.5 F (36.4 C) (Oral)  Ht 6\' 5"  (1.956 m)  Wt 229 lb  (103.874 kg)  BMI 27.15 kg/m2  SpO2 97% General:   Well developed, well nourished . NAD.  HEENT:  Normocephalic . Face symmetric, atraumatic   Skin: Area of patient's concern, dorsum of the right foot is normal to inspection on palpation Neurologic:  alert & oriented X3.  Speech normal, gait appropriate for age and unassisted. +-Tremor noted Psych--  Cognition and judgment appear intact.  Cooperative with normal attention span and concentration.  Behavior appropriate. No anxious or depressed appearing.      Assessment & Plan:   Problem List >  Hyperlipidemia Atrial fibrillation-flutter 2010 Hypertrophic obstructive cardiomyopathy  (Dr Stanford Breed) Parkinsonism (Dr Tat) Neuropathy Psoriasis BPH B12 deficiency  A/P  rash: Resolved Hyperlipidemia: On simvastatin for long time, likes to stop simvastatin if possible; labs reviewed, cholesterol has always been well-controlled, don't have a baseline. The patient is a not smoker, father had heart disease (? age of onset) , stress test neg 2015. We agreed on discontinue simvastatin, work on a healthier diet and exercise (see AVS) and recheck FLP in 6 months which will be his new baseline.

## 2015-02-11 NOTE — Progress Notes (Signed)
Pre visit review using our clinic review tool, if applicable. No additional management support is needed unless otherwise documented below in the visit note. 

## 2015-02-11 NOTE — Assessment & Plan Note (Signed)
rash: Resolved Hyperlipidemia: On simvastatin for long time, likes to stop simvastatin if possible; labs reviewed, cholesterol has always been well-controlled, don't have a baseline. The patient is a not smoker, father had heart disease (? age of onset) , stress test neg 2015. We agreed on discontinue simvastatin, work on a healthier diet and exercise (see AVS) and recheck FLP in 6 months which will be his new baseline.

## 2015-02-18 ENCOUNTER — Ambulatory Visit (INDEPENDENT_AMBULATORY_CARE_PROVIDER_SITE_OTHER): Payer: Medicare Other

## 2015-02-18 VITALS — BP 116/68 | HR 54 | Ht 76.0 in | Wt 225.0 lb

## 2015-02-18 DIAGNOSIS — Z0000 Encounter for general adult medical examination without abnormal findings: Secondary | ICD-10-CM

## 2015-02-18 NOTE — Progress Notes (Signed)
Subjective:   Jeff [name] is a 64 y.o. male who presents for an Initial Medicare Annual Wellness Visit.  Review of Systems: No ROS  Sleep patterns:  Sleeps 5-6 hours per night/wakes up once during the night Home Safety/Smoke Alarms:  Feels safe at home. Lives with wife and 2 cats.  Smoke alarms.  No alarm system present.  Firearm Safety:  Firearms kept in a safe place.   Seat Belt Safety/Bike Helmet:  Always wears seat belt/Always wears helmet  Counseling:   Eye Exam- 3-4 months ago-Dr. Mona every 6 months-Dr. Wyline Beady Male:  [facility]- 11/20/12; repeat 5 years (2019)   PSA- 02/17/14   UTD on immunizations.    Objective:    Today's Vitals   02/18/15 0816  BP: 116/68  Pulse: 54  Height: 6\' 4"  (1.93 m)  Weight: 225 lb (102.059 kg)  SpO2: 98%  PainSc: 0-No pain    Current Medications (verified) Outpatient Encounter Prescriptions as of 02/18/2015  Medication Sig  . amoxicillin (AMOXIL) 500 MG capsule Take 2,000 mg by mouth See admin instructions. Before dental work  . aspirin EC 325 MG tablet Take 325 mg by mouth daily.    . carbidopa-levodopa (SINEMET CR) 50-200 MG per tablet Take 1 tablet by mouth at bedtime.  . carbidopa-levodopa (SINEMET IR) 25-100 MG per tablet Take 2 tablets by mouth 3 (three) times daily.  . clobetasol cream (TEMOVATE) 8.18 % Apply 1 application topically 2 (two) times daily as needed (for skin irritation).  Marland Kitchen disopyramide (NORPACE) 150 MG capsule Take 150 mg by mouth 2 (two) times daily.  Marland Kitchen gabapentin (NEURONTIN) 300 MG capsule 1 in the AM, 2 at bedtime  . Melatonin 3 MG CAPS Take 1 capsule by mouth at bedtime.  . metoprolol succinate (TOPROL-XL) 25 MG 24 hr tablet Take 25 mg by mouth daily.  . pramipexole (MIRAPEX) 0.5 MG tablet Take 0.5 tablets (0.25 mg total) by mouth 3 (three) times daily.  . vitamin B-12 (CYANOCOBALAMIN) 1000 MCG tablet Take 1,000 mcg by mouth daily.  . Soft Lens Products (RENU SALINE) SOLN Place 2 drops into both  eyes as needed (dry eyes).   No facility-administered encounter medications on file as of 02/18/2015.    Allergies (verified) Review of patient's allergies indicates no known allergies.   History: Past Medical History  Diagnosis Date  . Hyperlipidemia     takes Simvastatin daily  . Hypertrophic obstructive cardiomyopathy     takes Amoxicillin prior to dental work and Metoprolol daily  . Psoriasis     uses Temovate cream as needed  . Allergic rhinitis   . Parkinsonism     takes Mirapex tid and Sinemet tid as well  . Heart murmur   . Atrial fibrillation 09/2008    hx of-takes Norpace daily  . Atrial flutter 09/2008    hx of  . Neuropathy   . Peripheral edema     right foot-seeing Dr.Paz on Wed about this  . Hemorrhoids   . Enlarged prostate but doesn't take any meds  . Cataract     immature but not sure which eye  . Insomnia     but no meds required   Past Surgical History  Procedure Laterality Date  . Colonoscopy      x 2  . Polypectomy    . Tonsillectomy      as a child  . Inguinal hernia repair Bilateral 05/14/2013    Procedure: LAPAROSCOPIC INGUINAL HERNIA REPAIR ;  Surgeon:  Harl Bowie, MD;  Location: [place];  Service: General;  Laterality: Bilateral;  . Insertion of mesh Bilateral 05/14/2013    Procedure: INSERTION OF MESH;  Surgeon: Harl Bowie, MD;  Location: [place];  Service: General;  Laterality: Bilateral;   Family History  Problem Relation Age of Onset  . Hypertension Neg Hx   . Stroke Neg Hx   . Colon cancer Neg Hx   . Prostate cancer Neg Hx   . Heart murmur Brother   . Alzheimer's disease Father   . Diabetes Father   . Heart disease Father     age ?   Social History   Occupational History  . [employer] and [employer] professor  .     Social History Main Topics  . Smoking status: Former Smoker    Types: Cigarettes  . Smokeless tobacco: Never Used     Comment: quit smoking  in the 70s  . Alcohol  Use: 1.8 oz/week    3 Standard drinks or equivalent per week     Comment: beer a couple of times a week  . Drug Use: Yes     Comment: Smokes marijuana once a week  . Sexual Activity: Yes   Tobacco Counseling Counseling given: Yes   Activities of Daily Living In your present state of health, do you have any difficulty performing the following activities: 02/18/2015 02/11/2015  Hearing? N N  Vision? N N  Difficulty concentrating or making decisions? N N  Walking or climbing stairs? N N  Dressing or bathing? N N  Doing errands, shopping? N N  Preparing Food and eating ? N -  Using the Toilet? N -  In the past six months, have you accidently leaked urine? Y -  Do you have problems with loss of bowel control? N -  Managing your Medications? N -  Managing your Finances? N -  Housekeeping or managing your Housekeeping? N -    Immunizations and Health Maintenance Immunization History  Administered Date(s) Administered  . Influenza Split 03/13/2011, 02/25/2012  . Influenza Whole 02/25/2009, 03/13/2010  . Influenza,inj,Quad PF,36+ Mos 02/06/2013, 01/20/2014, 02/11/2015  . Tdap 07/01/2012  . Zoster 03/22/2011   There are no preventive care reminders to display for this patient.  Patient Care Team: Colon Branch, MD as PCP - General Ladene Artist, MD as Consulting Physician (Gastroenterology) Lelon Perla, MD as Consulting Physician (Cardiology) Ludwig Clarks, DO as Consulting Physician (Neurology) Rutherford Guys, MD as Consulting Physician (Ophthalmology)  Dr. Wynetta Emery Dr. Hayes Ludwig any recent Medical Services you may have received from other than [facility] providers in the past year (date may be approximate).    Assessment:   This is a routine wellness examination for [name].   Atrial fibrillaion- stable on medication. Asymptomatic.  Following with Dr. Stanford Breed.  Parkinson disease- stable on medication.  Follow up with Dr. Carles Collet.  Hyperlipidemia- Last lipid  panel-02/19/14-normal.  Simvastatin discontinued by provider.  Pt working on [employer] and exercise.  Dr. Larose Kells is following.     Hearing/Vision screen  Hearing Screening   125Hz  250Hz  500Hz  1000Hz  2000Hz  4000Hz  8000Hz   Right ear:   100      Left ear:    100     Comments: No recent changes in hearing.    Vision Screening Comments: Slight change in vision. Last exam: 3 -4 months ago-Dr. Gershon Crane  Dietary issues and exercise activities discussed: Current  Exercise Habits:: Structured exercise class, Type of exercise: yoga;walking (water aerobics ), Time (Minutes): 60, Frequency (Times/Week): 3, Weekly Exercise (Minutes/Week): 180   Diet- Breakfast-cereal and hard boiled egg, chocolate milk, fusion drink  Lunch-pbj, blt  Dinner-wife cooks 1 meat, 1-2 veggies, 1 starch.  Enjoy cookies.    Goals    . I would like to walk more with friend [name].        Depression Screen PHQ 2/9 Scores 02/18/2015 02/11/2015 02/06/2013  PHQ - 2 Score 0 0 1    Fall Risk Fall Risk  02/18/2015 02/11/2015 02/06/2013  Falls in the past year? No No No    Cognitive Function: MMSE - Mini Mental State Exam 02/18/2015  Orientation to time 5  Orientation to Place 5  Registration 3  Attention/ Calculation 5  Recall 3  Language- name 2 objects 2  Language- repeat 1  Language- follow 3 step command 3  Language- read & follow direction 1  Write a sentence 1  Copy design 1  Total score 30    Screening Tests Health Maintenance  Topic Date Due  . Hepatitis C Screening  05/27/2015 (Originally [birth year])  . HIV Screening  05/27/2015 (Originally 02/19/1966)  . INFLUENZA VACCINE  12/27/2015  . COLONOSCOPY  11/20/2017  . TETANUS/TDAP  07/01/2022  . ZOSTAVAX  Completed    Plan:  Follow up with Dr. Larose Kells as scheduled.  Continue heart healthy diet and exercise.   During the course of the visit Jaser was educated and counseled about the following appropriate screening and preventive services:   Vaccines to  include Pneumoccal, Influenza, Hepatitis B, Td, Zostavax, HCV  Electrocardiogram  Colorectal cancer screening  Cardiovascular disease screening  Diabetes screening  Glaucoma screening  Nutrition counseling  Prostate cancer screening  Smoking cessation counseling  Patient Instructions (the written plan) were given to the patient.   Rudene Anda, RN   02/18/2015

## 2015-02-18 NOTE — Progress Notes (Signed)
Pre visit review using our clinic review tool, if applicable. No additional management support is needed unless otherwise documented below in the visit note. 

## 2015-02-18 NOTE — Patient Instructions (Addendum)
Follow up with Dr. Larose Kells as scheduled.  Continue heart healthy diet and exercise.    Fat and Cholesterol Control Diet Fat and cholesterol levels in your blood and organs are influenced by your diet. High levels of fat and cholesterol may lead to diseases of the heart, small and large blood vessels, gallbladder, liver, and pancreas. CONTROLLING FAT AND CHOLESTEROL WITH DIET Although exercise and lifestyle factors are important, your diet is key. That is because certain foods are known to raise cholesterol and others to lower it. The goal is to balance foods for their effect on cholesterol and more importantly, to replace saturated and trans fat with other types of fat, such as monounsaturated fat, polyunsaturated fat, and omega-3 fatty acids. On average, a person should consume no more than 15 to 17 g of saturated fat daily. Saturated and trans fats are considered "bad" fats, and they will raise LDL cholesterol. Saturated fats are primarily found in animal products such as meats, butter, and cream. However, that does not mean you need to give up all your favorite foods. Today, there are good tasting, low-fat, low-cholesterol substitutes for most of the things you like to eat. Choose low-fat or nonfat alternatives. Choose round or loin cuts of red meat. These types of cuts are lowest in fat and cholesterol. Chicken (without the skin), fish, veal, and ground [place] breast are great choices. Eliminate fatty meats, such as hot dogs and salami. Even shellfish have little or no saturated fat. Have a 3 oz (85 g) portion when you eat lean meat, poultry, or fish. Trans fats are also called "partially hydrogenated oils." They are oils that have been scientifically manipulated so that they are solid at room temperature resulting in a longer shelf life and improved taste and texture of foods in which they are added. Trans fats are found in stick margarine, some tub margarines, cookies, crackers, and baked goods.  When  baking and cooking, oils are a great substitute for butter. The monounsaturated oils are especially beneficial since it is believed they lower LDL and raise HDL. The oils you should avoid entirely are saturated tropical oils, such as coconut and palm.  Remember to eat a lot from food groups that are naturally free of saturated and trans fat, including fish, fruit, vegetables, beans, grains (barley, rice, couscous, bulgur wheat), and pasta (without cream sauces).  IDENTIFYING FOODS THAT LOWER FAT AND CHOLESTEROL  Soluble fiber may lower your cholesterol. This type of fiber is found in fruits such as apples, vegetables such as broccoli, potatoes, and carrots, legumes such as beans, peas, and lentils, and grains such as barley. Foods fortified with plant sterols (phytosterol) may also lower cholesterol. You should eat at least 2 g per day of these foods for a cholesterol lowering effect.  Read package labels to identify low-saturated fats, trans fat free, and low-fat foods at the supermarket. Select cheeses that have only 2 to 3 g saturated fat per ounce. Use a heart-healthy tub margarine that is free of trans fats or partially hydrogenated oil. When buying baked goods (cookies, crackers), avoid partially hydrogenated oils. Breads and muffins should be made from whole grains (whole-wheat or whole oat flour, instead of "flour" or "enriched flour"). Buy non-creamy canned soups with reduced salt and no added fats.  FOOD PREPARATION TECHNIQUES  Never deep-fry. If you must fry, either stir-fry, which uses very little fat, or use non-stick cooking sprays. When possible, broil, bake, or roast meats, and steam vegetables. Instead of putting butter or  margarine on vegetables, use lemon and herbs, applesauce, and cinnamon (for squash and sweet potatoes). Use nonfat yogurt, salsa, and low-fat dressings for salads.  LOW-SATURATED FAT / LOW-FAT FOOD SUBSTITUTES Meats / Saturated Fat (g)  Avoid: Steak, marbled (3 oz/85 g)  / 11 g  Choose: Steak, lean (3 oz/85 g) / 4 g  Avoid: Hamburger (3 oz/85 g) / 7 g  Choose: Hamburger, lean (3 oz/85 g) / 5 g  Avoid: Ham (3 oz/85 g) / 6 g  Choose: Ham, lean cut (3 oz/85 g) / 2.4 g  Avoid: Chicken, with skin, dark meat (3 oz/85 g) / 4 g  Choose: Chicken, skin removed, dark meat (3 oz/85 g) / 2 g  Avoid: Chicken, with skin, light meat (3 oz/85 g) / 2.5 g  Choose: Chicken, skin removed, light meat (3 oz/85 g) / 1 g Dairy / Saturated Fat (g)  Avoid: Whole milk (1 cup) / 5 g  Choose: Low-fat milk, 2% (1 cup) / 3 g  Choose: Low-fat milk, 1% (1 cup) / 1.5 g  Choose: Skim milk (1 cup) / 0.3 g  Avoid: Hard cheese (1 oz/28 g) / 6 g  Choose: Skim milk cheese (1 oz/28 g) / 2 to 3 g  Avoid: Cottage cheese, 4% fat (1 cup) / 6.5 g  Choose: Low-fat cottage cheese, 1% fat (1 cup) / 1.5 g  Avoid: Ice cream (1 cup) / 9 g  Choose: Sherbet (1 cup) / 2.5 g  Choose: Nonfat frozen yogurt (1 cup) / 0.3 g  Choose: Frozen fruit bar / trace  Avoid: Whipped cream (1 tbs) / 3.5 g  Choose: Nondairy whipped topping (1 tbs) / 1 g Condiments / Saturated Fat (g)  Avoid: Mayonnaise (1 tbs) / 2 g  Choose: Low-fat mayonnaise (1 tbs) / 1 g  Avoid: Butter (1 tbs) / 7 g  Choose: Extra light margarine (1 tbs) / 1 g  Avoid: Coconut oil (1 tbs) / 11.8 g  Choose: Olive oil (1 tbs) / 1.8 g  Choose: Corn oil (1 tbs) / 1.7 g  Choose: Safflower oil (1 tbs) / 1.2 g  Choose: Sunflower oil (1 tbs) / 1.4 g  Choose: Soybean oil (1 tbs) / 2.4 g  Choose: Canola oil (1 tbs) / 1 g Document Released: 05/14/2005 Document Revised: 09/08/2012 Document Reviewed: 08/12/2013 Exit[employer] Patient Information 2015 [place], [place]. This information is not intended to replace advice given to you by your health care provider. Make sure you discuss any questions you have with your health care provider.  Health Maintenance A healthy lifestyle and preventative care can promote health and  wellness.  Maintain regular health, dental, and eye exams.  Eat a healthy diet. Foods like vegetables, fruits, whole grains, low-fat dairy products, and lean protein foods contain the nutrients you need and are low in calories. Decrease your intake of foods high in solid fats, added sugars, and salt. Get information about a proper diet from your health care provider, if necessary.  Regular physical exercise is one of the most important things you can do for your health. Most adults should get at least 150 minutes of moderate-intensity exercise (any activity that increases your heart rate and causes you to sweat) each week. In addition, most adults need muscle-strengthening exercises on 2 or more days a week.   Maintain a healthy weight. The body mass index (BMI) is a screening tool to identify possible weight problems. It provides an estimate of body fat based on height  and weight. Your health care provider can find your BMI and can help you achieve or maintain a healthy weight. For males 20 years and older:  A BMI below 18.5 is considered underweight.  A BMI of 18.5 to 24.9 is normal.  A BMI of 25 to 29.9 is considered overweight.  A BMI of 30 and above is considered obese.  Maintain normal blood lipids and cholesterol by exercising and minimizing your intake of saturated fat. Eat a balanced diet with plenty of fruits and vegetables. Blood tests for lipids and cholesterol should begin at age 81 and be repeated every 5 years. If your lipid or cholesterol levels are high, you are over age 73, or you are at high risk for heart disease, you may need your cholesterol levels checked more frequently.Ongoing high lipid and cholesterol levels should be treated with medicines if diet and exercise are not working.  If you smoke, find out from your health care provider how to quit. If you do not use tobacco, do not start.  Lung cancer screening is recommended for adults aged 81-80 years who are at high  risk for developing lung cancer because of a history of smoking. A yearly low-dose CT scan of the lungs is recommended for people who have at least a 30-pack-year history of smoking and are current smokers or have quit within the past 15 years. A pack year of smoking is smoking an average of 1 pack of cigarettes a day for 1 year (for example, a 30-pack-year history of smoking could mean smoking 1 pack a day for 30 years or 2 packs a day for 15 years). Yearly screening should continue until the smoker has stopped smoking for at least 15 years. Yearly screening should be stopped for people who develop a health problem that would prevent them from having lung cancer treatment.  If you choose to drink alcohol, do not have more than 2 drinks per day. One drink is considered to be 12 oz (360 mL) of beer, 5 oz (150 mL) of wine, or 1.5 oz (45 mL) of liquor.  Avoid the use of street drugs. Do not share needles with anyone. Ask for help if you need support or instructions about stopping the use of drugs.  High blood pressure causes heart disease and increases the risk of stroke. Blood pressure should be checked at least every 1-2 years. Ongoing high blood pressure should be treated with medicines if weight loss and exercise are not effective.  If you are 74-75 years old, ask your health care provider if you should take aspirin to prevent heart disease.  Diabetes screening involves taking a blood sample to check your fasting blood sugar level. This should be done once every 3 years after age 54 if you are at a normal weight and without risk factors for diabetes. Testing should be considered at a younger age or be carried out more frequently if you are overweight and have at least 1 risk factor for diabetes.  Colorectal cancer can be detected and often prevented. Most routine colorectal cancer screening begins at the age of 58 and continues through age 30. However, your health care provider may recommend screening  at an earlier age if you have risk factors for colon cancer. On a yearly basis, your health care provider may provide home test kits to check for hidden blood in the stool. A small camera at the end of a tube may be used to directly examine the colon (sigmoidoscopy or colonoscopy)  to detect the earliest forms of colorectal cancer. Talk to your health care provider about this at age 48 when routine screening begins. A direct exam of the colon should be repeated every 5-10 years through age 48, unless early forms of precancerous polyps or small growths are found.  People who are at an increased risk for hepatitis B should be screened for this virus. You are considered at high risk for hepatitis B if:  You were born in a country where hepatitis B occurs often. Talk with your health care provider about which countries are considered high risk.  Your parents were born in a high-risk country and you have not received a shot to protect against hepatitis B (hepatitis B vaccine).  You have HIV or AIDS.  You use needles to inject street drugs.  You live with, or have sex with, someone who has hepatitis B.  You are a man who has sex with other men (MSM).  You get hemodialysis treatment.  You take certain medicines for conditions like cancer, organ transplantation, and autoimmune conditions.  Hepatitis C blood testing is recommended for all people born from [birth year] through 1966 and any individual with known risk factors for hepatitis C.  Healthy men should no longer receive prostate-specific antigen (PSA) blood tests as part of routine cancer screening. Talk to your health care provider about prostate cancer screening.  Testicular cancer screening is not recommended for adolescents or adult males who have no symptoms. Screening includes self-exam, a health care provider exam, and other screening tests. Consult with your health care provider about any symptoms you have or any concerns you have about  testicular cancer.  Practice safe sex. Use condoms and avoid high-risk sexual practices to reduce the spread of sexually transmitted infections (STIs).  You should be screened for STIs, including gonorrhea and chlamydia if:  You are sexually active and are younger than 24 years.  You are older than 24 years, and your health care provider tells you that you are at risk for this type of infection.  Your sexual activity has changed since you were last screened, and you are at an increased risk for chlamydia or gonorrhea. Ask your health care provider if you are at risk.  If you are at risk of being infected with HIV, it is recommended that you take a prescription medicine daily to prevent HIV infection. This is called pre-exposure prophylaxis (PrEP). You are considered at risk if:  You are a man who has sex with other men (MSM).  You are a heterosexual man who is sexually active with multiple partners.  You take drugs by injection.  You are sexually active with a partner who has HIV.  Talk with your health care provider about whether you are at high risk of being infected with HIV. If you choose to begin PrEP, you should first be tested for HIV. You should then be tested every 3 months for as long as you are taking PrEP.  Use sunscreen. Apply sunscreen liberally and repeatedly throughout the day. You should seek shade when your shadow is shorter than you. Protect yourself by wearing long sleeves, pants, a wide-brimmed hat, and sunglasses year round whenever you are outdoors.  Tell your health care provider of new moles or changes in moles, especially if there is a change in shape or color. Also, tell your health care provider if a mole is larger than the size of a pencil eraser.  A one-time screening for abdominal aortic aneurysm (AAA)  and surgical repair of large AAAs by ultrasound is recommended for men aged 73-75 years who are current or former smokers.  Stay current with your vaccines  (immunizations). Document Released: 11/10/2007 Document Revised: 05/19/2013 Document Reviewed: 10/09/2010 [facility] Patient Information 2015 [place], [place]. This information is not intended to replace advice given to you by your health care provider. Make sure you discuss any questions you have with your health care provider.
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
## 2015-02-24 NOTE — Progress Notes (Signed)
HPI: FU hypertrophic obstructive cardiomyopathy and atrial fibrillation. Renal Dopplers in September 2014 normal. Last echocardiogram October 2014 showed severe left ventricular hypertrophy (septum 21 mm) with normal LV function. The mean gradient across the left ventricular outflow tract was 37 mmHg. There was grade 2 diastolic dysfunction. Mild to moderate aortic insufficiency and mitral regurgitation. Severe left atrial enlargement. Holter monitor in November 2014 showed sinus rhythm with occasional PVC and couplet. One 3 beat run of nonsustained ventricular tachycardia. Exercise treadmill November 2014 showed no significant arrhythmias and normal increase in systolic blood pressure with exercise. Patient also apparently found to have atrial fibrillation on previous monitor. Since he was last seen, there is no dyspnea, chest pain, palpitations or syncope.  Current Outpatient Prescriptions  Medication Sig Dispense Refill  . amoxicillin (AMOXIL) 500 MG capsule Take 2,000 mg by mouth See admin instructions. Before dental work    . aspirin EC 325 MG tablet Take 325 mg by mouth daily.      . carbidopa-levodopa (SINEMET CR) 50-200 MG per tablet Take 1 tablet by mouth at bedtime. 90 tablet 3  . carbidopa-levodopa (SINEMET IR) 25-100 MG per tablet Take 2 tablets by mouth 3 (three) times daily. 540 tablet 3  . clobetasol cream (TEMOVATE) 7.42 % Apply 1 application topically 2 (two) times daily as needed (for skin irritation).    Marland Kitchen disopyramide (NORPACE) 150 MG capsule Take 150 mg by mouth 2 (two) times daily.    Marland Kitchen gabapentin (NEURONTIN) 300 MG capsule 1 in the AM, 2 at bedtime 270 capsule 3  . Melatonin 3 MG CAPS Take 1 capsule by mouth at bedtime.    . metoprolol succinate (TOPROL-XL) 25 MG 24 hr tablet Take 25 mg by mouth daily.    . pramipexole (MIRAPEX) 0.5 MG tablet Take 0.5 tablets (0.25 mg total) by mouth 3 (three) times daily. 135 tablet 3  . Soft Lens Products (RENU SALINE) SOLN Place 2  drops into both eyes as needed (dry eyes).    . vitamin B-12 (CYANOCOBALAMIN) 1000 MCG tablet Take 1,000 mcg by mouth daily.     No current facility-administered medications for this visit.     Past Medical History  Diagnosis Date  . Hyperlipidemia     takes Simvastatin daily  . Hypertrophic obstructive cardiomyopathy     takes Amoxicillin prior to dental work and Metoprolol daily  . Psoriasis     uses Temovate cream as needed  . Allergic rhinitis   . Parkinsonism     takes Mirapex tid and Sinemet tid as well  . Heart murmur   . Atrial fibrillation 09/2008    hx of-takes Norpace daily  . Atrial flutter 09/2008    hx of  . Neuropathy   . Peripheral edema     right foot-seeing Dr.Paz on Wed about this  . Hemorrhoids   . Enlarged prostate but doesn't take any meds  . Cataract     immature but not sure which eye  . Insomnia     but no meds required    Past Surgical History  Procedure Laterality Date  . Colonoscopy      x 2  . Polypectomy    . Tonsillectomy      as a child  . Inguinal hernia repair Bilateral 05/14/2013    Procedure: LAPAROSCOPIC INGUINAL HERNIA REPAIR ;  Surgeon: Harl Bowie, MD;  Location: [facility];  Service: General;  Laterality: Bilateral;  . Insertion of mesh Bilateral 05/14/2013  Procedure: INSERTION OF MESH;  Surgeon: Harl Bowie, MD;  Location: [facility];  Service: General;  Laterality: Bilateral;    Social History   Social History  . Marital Status: Married    Spouse Name: Pamala Hurry  . Number of Children: 0  . Years of Education: N/A   Occupational History  . [employer] and [employer] professor  .     Social History Main Topics  . Smoking status: Former Smoker    Types: Cigarettes  . Smokeless tobacco: Never Used     Comment: quit smoking  in the 70s  . Alcohol Use: 1.8 oz/week    3 Standard drinks or equivalent per week     Comment: beer a couple of times a week  . Drug Use: Yes     Comment:  Smokes marijuana once a week  . Sexual Activity: Yes   Other Topics Concern  . Not on file   Social History Narrative    ROS: no fevers or chills, productive cough, hemoptysis, dysphasia, odynophagia, melena, hematochezia, dysuria, hematuria, rash, seizure activity, orthopnea, PND, pedal edema, claudication. Remaining systems are negative.  Physical Exam: Well-developed well-nourished in no acute distress.  Skin is warm and dry.  HEENT is normal.  Neck is supple.  Chest is clear to auscultation with normal expansion.  Cardiovascular exam is regular rate and rhythm.  Abdominal exam nontender or distended. No masses palpated. Positive bruit Extremities show no edema. neuro grossly intact  ECG sinus rhythm with first-degree AV block. Right bundle branch block, cannot rule out prior septal infarct. Lateral T-wave inversion. Prolonged QT interval.

## 2015-02-25 ENCOUNTER — Ambulatory Visit (INDEPENDENT_AMBULATORY_CARE_PROVIDER_SITE_OTHER): Payer: Medicare Other | Admitting: Cardiology

## 2015-02-25 ENCOUNTER — Encounter: Payer: Self-pay | Admitting: Cardiology

## 2015-02-25 VITALS — BP 124/86 | HR 54 | Ht 76.5 in | Wt 225.8 lb

## 2015-02-25 DIAGNOSIS — R0989 Other specified symptoms and signs involving the circulatory and respiratory systems: Secondary | ICD-10-CM | POA: Diagnosis not present

## 2015-02-25 DIAGNOSIS — I421 Obstructive hypertrophic cardiomyopathy: Secondary | ICD-10-CM

## 2015-02-25 DIAGNOSIS — I480 Paroxysmal atrial fibrillation: Secondary | ICD-10-CM

## 2015-02-25 NOTE — Assessment & Plan Note (Addendum)
Patient remains asymptomatic. Continue present dose of beta blocker.He does not have a family history of sudden death, no history of syncope and his previous blood pressure increased with exercise. His septum was 21 mm on last echo. His only risk factor of sudden death is 3 beats of nonsustained ventricular tachycardia on previous monitor. I therefore do not think ICD is indicated at this point. We will plan to repeat his echocardiogram, holter and ETT. I have recommended that he not exercise vigorously. I also asked him to have his siblings screened.

## 2015-02-25 NOTE — Assessment & Plan Note (Signed)
Schedule ultrasound to exclude aneurysm. 

## 2015-02-25 NOTE — Assessment & Plan Note (Addendum)
Patient apparently has had documented atrial fibrillation and flutter in the distant past. He has one embolic risk factor of age 64. His risk of embolic event is most likely increased because of hypertrophic cardiomyopathy. Continue disopyramide. I have recommended discontinuation of aspirin and beginning apixaban. He would like to consider this before beginning. He understands the higher risk of stroke with aspirin compared to anticoagulation.

## 2015-02-25 NOTE — Patient Instructions (Signed)
Your physician wants you to follow-up in: [place] will receive a reminder letter in the mail two months in advance. If you don't receive a letter, please call our office to schedule the follow-up appointment.   Your physician has requested that you have an echocardiogram. Echocardiography is a painless test that uses sound waves to create images of your heart. It provides your doctor with information about the size and shape of your heart and how well your heart's chambers and valves are working. This procedure takes approximately one hour. There are no restrictions for this procedure.   Your physician has recommended that you wear a 24 HOUR holter monitor. Holter monitors are medical devices that record the heart's electrical activity. Doctors most often use these monitors to diagnose arrhythmias. Arrhythmias are problems with the speed or rhythm of the heartbeat. The monitor is a small, portable device. You can wear one while you do your normal daily activities. This is usually used to diagnose what is causing palpitations/syncope (passing out).   Your physician has requested that you have an abdominal aorta duplex. During this test, an ultrasound is used to evaluate the aorta. Allow 30 minutes for this exam. Do not eat after midnight the day before and avoid carbonated beverages   Your physician has requested that you have an exercise tolerance test. For further information please visit [email or web address]. Please also follow instruction sheet, as given.  ELIQUIS TO REPLACE WARFARIN   Exercise Stress Electrocardiogram An exercise stress electrocardiogram is a test to check how blood flows to your heart. It is done to find areas of poor blood flow. You will need to walk on a treadmill for this test. The electrocardiogram will record your heartbeat when you are at rest and when you are exercising. BEFORE THE PROCEDURE  Do not have drinks with caffeine or foods with caffeine  for 24 hours before the test, or as told by your doctor. This includes coffee, tea (even decaf tea), sodas, chocolate, and cocoa.  Follow your doctor's instructions about eating and drinking before the test.  Ask your doctor what medicines you should or should not take before the test. Take your medicines with water unless told by your doctor not to.  If you use an inhaler, bring it with you to the test.  Bring a snack to eat after the test.  Do not  smoke for 4 hours before the test.  Do not put lotions, powders, creams, or oils on your chest before the test.  Wear comfortable shoes and clothing. PROCEDURE  You will have patches put on your chest. Small areas of your chest may need to be shaved. Wires will be connected to the patches.  Your heart rate will be watched while you are resting and while you are exercising.  You will walk on the treadmill. The treadmill will slowly get faster to raise your heart rate.  The test will take about 1-2 hours. AFTER THE PROCEDURE  Your heart rate and blood pressure will be watched after the test.  You may return to your normal diet, activities, and medicines or as told by your doctor. Document Released: 10/31/2007 Document Revised: 09/28/2013 Document Reviewed: 01/19/2013 [facility] Patient Information 2015 [place], [place]. This information is not intended to replace advice given to you by your health care provider. Make sure you discuss any questions you have with your health care provider.

## 2015-02-27 ENCOUNTER — Other Ambulatory Visit: Payer: Self-pay | Admitting: Internal Medicine

## 2015-03-14 ENCOUNTER — Other Ambulatory Visit: Payer: Self-pay | Admitting: Cardiology

## 2015-03-14 DIAGNOSIS — I480 Paroxysmal atrial fibrillation: Secondary | ICD-10-CM

## 2015-03-14 DIAGNOSIS — I493 Ventricular premature depolarization: Secondary | ICD-10-CM

## 2015-03-14 DIAGNOSIS — I421 Obstructive hypertrophic cardiomyopathy: Secondary | ICD-10-CM

## 2015-03-15 ENCOUNTER — Ambulatory Visit (INDEPENDENT_AMBULATORY_CARE_PROVIDER_SITE_OTHER): Payer: Medicare Other

## 2015-03-15 DIAGNOSIS — I480 Paroxysmal atrial fibrillation: Secondary | ICD-10-CM | POA: Diagnosis not present

## 2015-03-15 DIAGNOSIS — I493 Ventricular premature depolarization: Secondary | ICD-10-CM

## 2015-03-15 DIAGNOSIS — I421 Obstructive hypertrophic cardiomyopathy: Secondary | ICD-10-CM | POA: Diagnosis not present

## 2015-03-20 ENCOUNTER — Encounter: Payer: Self-pay | Admitting: Cardiology

## 2015-03-22 ENCOUNTER — Encounter: Payer: Self-pay | Admitting: Cardiology

## 2015-03-25 ENCOUNTER — Ambulatory Visit (HOSPITAL_COMMUNITY): Payer: Medicare Other | Attending: Cardiology

## 2015-03-25 ENCOUNTER — Other Ambulatory Visit: Payer: Self-pay | Admitting: Cardiology

## 2015-03-25 ENCOUNTER — Other Ambulatory Visit: Payer: Self-pay

## 2015-03-25 DIAGNOSIS — I77810 Thoracic aortic ectasia: Secondary | ICD-10-CM | POA: Diagnosis not present

## 2015-03-25 DIAGNOSIS — I480 Paroxysmal atrial fibrillation: Secondary | ICD-10-CM | POA: Diagnosis not present

## 2015-03-25 DIAGNOSIS — I421 Obstructive hypertrophic cardiomyopathy: Secondary | ICD-10-CM | POA: Diagnosis not present

## 2015-03-25 DIAGNOSIS — I5189 Other ill-defined heart diseases: Secondary | ICD-10-CM | POA: Insufficient documentation

## 2015-03-25 DIAGNOSIS — I351 Nonrheumatic aortic (valve) insufficiency: Secondary | ICD-10-CM | POA: Insufficient documentation

## 2015-03-25 DIAGNOSIS — I340 Nonrheumatic mitral (valve) insufficiency: Secondary | ICD-10-CM | POA: Diagnosis not present

## 2015-03-30 ENCOUNTER — Ambulatory Visit (HOSPITAL_COMMUNITY)
Admission: RE | Admit: 2015-03-30 | Discharge: 2015-03-30 | Disposition: A | Discharge status: Home / Self Care | Payer: Medicare Other | Source: Ambulatory Visit | Attending: Cardiology | Admitting: Cardiology

## 2015-03-30 ENCOUNTER — Telehealth: Payer: Self-pay

## 2015-03-30 ENCOUNTER — Inpatient Hospital Stay (HOSPITAL_COMMUNITY): Admission: RE | Admit: 2015-03-30 | Payer: BC Managed Care – PPO | Source: Ambulatory Visit

## 2015-03-30 DIAGNOSIS — R0989 Other specified symptoms and signs involving the circulatory and respiratory systems: Secondary | ICD-10-CM | POA: Diagnosis present

## 2015-03-30 DIAGNOSIS — I714 Abdominal aortic aneurysm, without rupture: Secondary | ICD-10-CM | POA: Diagnosis not present

## 2015-03-30 DIAGNOSIS — I700 Atherosclerosis of aorta: Secondary | ICD-10-CM | POA: Diagnosis not present

## 2015-03-30 DIAGNOSIS — I77811 Abdominal aortic ectasia: Secondary | ICD-10-CM | POA: Insufficient documentation

## 2015-03-30 NOTE — Telephone Encounter (Signed)
Called to schedule AWV

## 2015-04-01 ENCOUNTER — Ambulatory Visit: Payer: BC Managed Care – PPO | Admitting: Cardiology

## 2015-04-24 ENCOUNTER — Other Ambulatory Visit: Payer: Self-pay | Admitting: Cardiology

## 2015-04-25 NOTE — Telephone Encounter (Signed)
Rx(s) sent to pharmacy electronically.  

## 2015-05-02 ENCOUNTER — Telehealth: Payer: Self-pay | Admitting: Cardiology

## 2015-05-02 MED ORDER — DISOPYRAMIDE PHOSPHATE 150 MG PO CAPS: 150.0000 mg | ORAL_CAPSULE | Freq: Two times a day (BID) | ORAL | Status: DC

## 2015-05-02 NOTE — Telephone Encounter (Signed)
Patient calling the office for samples of medication:   1.  What medication and dosage are you requesting samples for?Disopyramide  2.  Are you currently out of this medication? no

## 2015-05-02 NOTE — Telephone Encounter (Signed)
Spoke with pt, aware we do not receive generic samples. New refill sent to the pharmacy

## 2015-05-02 NOTE — Telephone Encounter (Signed)
Jeff [name] 9:05 AM 05/02/2015

## 2015-05-03 ENCOUNTER — Telehealth: Payer: Self-pay | Admitting: Psychiatry

## 2015-05-03 ENCOUNTER — Telehealth: Payer: Self-pay | Admitting: Cardiology

## 2015-05-03 ENCOUNTER — Telehealth: Payer: Self-pay

## 2015-05-03 NOTE — Telephone Encounter (Signed)
Prior auth for Norpace 150mg  sent to [facility].

## 2015-05-03 NOTE — Telephone Encounter (Signed)
Wrong Provider

## 2015-05-03 NOTE — Telephone Encounter (Signed)
Pt is having problem getting his Disopyramide. He needs you to call his [facility]- [phone]. He says this will speed up the process.

## 2015-05-04 NOTE — Telephone Encounter (Signed)
Spoke with [facility], they are going to fax paperwork to fill out. Pt made aware

## 2015-05-05 NOTE — Telephone Encounter (Signed)
paperwork faxed to the number provided

## 2015-05-06 ENCOUNTER — Telehealth: Payer: Self-pay | Admitting: Cardiology

## 2015-05-06 NOTE — Telephone Encounter (Signed)
Please call,his insurance company denied the prior approval for his Disopyramide. The insurance told him to call here to discuss other options.

## 2015-05-06 NOTE — Telephone Encounter (Signed)
Prior Authorization denied for pt's Norpace & he is seeking med alternative.  Routed to Dr. Stanford Breed to advise.

## 2015-05-06 NOTE — Telephone Encounter (Signed)
Pt said he checked with the insurance company and they said the alternate drug is cheaper and approved. The medicine is Amiodarone.

## 2015-05-06 NOTE — Telephone Encounter (Signed)
Would not change to amiodarone, more potential side effects and norpace better for pts with hocm Kirk Ruths

## 2015-05-09 ENCOUNTER — Encounter: Payer: Self-pay | Admitting: *Deleted

## 2015-05-09 NOTE — Telephone Encounter (Signed)
Spoke with pt, Aware of dr Jacalyn Lefevre recommendations.  Appeal letter generated and faxed to [phone]

## 2015-06-01 NOTE — Telephone Encounter (Signed)
Received letter norpace has been approved.

## 2015-06-06 ENCOUNTER — Ambulatory Visit: Payer: BC Managed Care – PPO | Admitting: Neurology

## 2015-06-07 ENCOUNTER — Ambulatory Visit: Payer: Medicare Other | Admitting: Neurology

## 2015-06-07 ENCOUNTER — Encounter: Payer: Self-pay | Admitting: Neurology

## 2015-06-07 ENCOUNTER — Ambulatory Visit (INDEPENDENT_AMBULATORY_CARE_PROVIDER_SITE_OTHER): Payer: Medicare Other | Admitting: Neurology

## 2015-06-07 VITALS — BP 114/70 | HR 68 | Ht 77.0 in | Wt 230.0 lb

## 2015-06-07 DIAGNOSIS — G609 Hereditary and idiopathic neuropathy, unspecified: Secondary | ICD-10-CM

## 2015-06-07 DIAGNOSIS — G4700 Insomnia, unspecified: Secondary | ICD-10-CM | POA: Diagnosis not present

## 2015-06-07 DIAGNOSIS — G20 Parkinson's disease: Secondary | ICD-10-CM | POA: Diagnosis not present

## 2015-06-07 DIAGNOSIS — E538 Deficiency of other specified B group vitamins: Secondary | ICD-10-CM | POA: Diagnosis not present
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
## 2015-06-08 ENCOUNTER — Encounter: Payer: Self-pay | Admitting: Neurology

## 2015-07-28 ENCOUNTER — Encounter: Payer: Self-pay | Admitting: Internal Medicine

## 2015-07-28 ENCOUNTER — Ambulatory Visit (HOSPITAL_BASED_OUTPATIENT_CLINIC_OR_DEPARTMENT_OTHER)
Admission: RE | Admit: 2015-07-28 | Discharge: 2015-07-28 | Disposition: A | Discharge status: Home / Self Care | Payer: Medicare Other | Source: Ambulatory Visit | Attending: Internal Medicine | Admitting: Internal Medicine

## 2015-07-28 ENCOUNTER — Ambulatory Visit (INDEPENDENT_AMBULATORY_CARE_PROVIDER_SITE_OTHER): Payer: Medicare Other | Admitting: Internal Medicine

## 2015-07-28 VITALS — BP 102/66 | HR 66 | Temp 97.4°F | Ht 77.0 in | Wt 223.1 lb

## 2015-07-28 DIAGNOSIS — Z09 Encounter for follow-up examination after completed treatment for conditions other than malignant neoplasm: Secondary | ICD-10-CM

## 2015-07-28 DIAGNOSIS — R197 Diarrhea, unspecified: Secondary | ICD-10-CM | POA: Diagnosis not present

## 2015-07-28 DIAGNOSIS — K529 Noninfective gastroenteritis and colitis, unspecified: Secondary | ICD-10-CM | POA: Diagnosis not present

## 2015-07-28 DIAGNOSIS — R112 Nausea with vomiting, unspecified: Secondary | ICD-10-CM

## 2015-07-28 LAB — COMPREHENSIVE METABOLIC PANEL WITH GFR
ALT: 27 U/L (ref 0–53)
AST: 39 U/L — ABNORMAL HIGH (ref 0–37)
Albumin: 4.3 g/dL (ref 3.5–5.2)
Alkaline Phosphatase: 43 U/L (ref 39–117)
BUN: 25 mg/dL — ABNORMAL HIGH (ref 6–23)
CO2: 29 meq/L (ref 19–32)
Calcium: 9.1 mg/dL (ref 8.4–10.5)
Chloride: 104 meq/L (ref 96–112)
Creatinine, Ser: 1.26 mg/dL (ref 0.40–1.50)
GFR: 60.96 mL/min
Glucose, Bld: 105 mg/dL — ABNORMAL HIGH (ref 70–99)
Potassium: 3.8 meq/L (ref 3.5–5.1)
Sodium: 139 meq/L (ref 135–145)
Total Bilirubin: 1.2 mg/dL (ref 0.2–1.2)
Total Protein: 6.8 g/dL (ref 6.0–8.3)

## 2015-07-28 LAB — CBC WITH DIFFERENTIAL/PLATELET
BASOS PCT: 0.2 % (ref 0.0–3.0)
Basophils Absolute: 0 10*3/uL (ref 0.0–0.1)
EOS PCT: 0.3 % (ref 0.0–5.0)
Eosinophils Absolute: 0 10*3/uL (ref 0.0–0.7)
HCT: 46.9 % (ref 39.0–52.0)
Hemoglobin: 15.9 g/dL (ref 13.0–17.0)
LYMPHS ABS: 1.1 10*3/uL (ref 0.7–4.0)
Lymphocytes Relative: 11.9 % — ABNORMAL LOW (ref 12.0–46.0)
MCHC: 33.9 g/dL (ref 30.0–36.0)
MCV: 88.5 fl (ref 78.0–100.0)
Monocytes Absolute: 0.9 10*3/uL (ref 0.1–1.0)
Monocytes Relative: 10 % (ref 3.0–12.0)
NEUTROS PCT: 77.6 % — AB (ref 43.0–77.0)
Neutro Abs: 7 10*3/uL (ref 1.4–7.7)
PLATELETS: 153 10*3/uL (ref 150.0–400.0)
RBC: 5.3 Mil/uL (ref 4.22–5.81)
RDW: 13.7 % (ref 11.5–15.5)
WBC: 9.1 10*3/uL (ref 4.0–10.5)

## 2015-07-28 MED ORDER — ONDANSETRON 4 MG PO TBDP: 4.0000 mg | ORAL_TABLET | Freq: Three times a day (TID) | ORAL | Status: DC | PRN

## 2015-07-28 NOTE — Progress Notes (Signed)
Subjective:    Patient ID: Jeff [name], male    DOB: [birth year], 65 y.o.   MRN: [id number]  DOS:  07/28/2015 Type of visit - description : Acute visit Interval history:  Symptoms started yesterday with decreased appetite, diarrhea and vomiting. Diarrhea was initially loose, now is watery. Vomiting was projectile, lasted a few hours, last episode about 12 noon. he feels weak and shaky, yesterday was feeling dizzy when he stood up but not today. No sick contacts, wife unaffected. No recent travel or unusual foods. not eating solids but has been able to drink fluids.   Review of Systems  Denies fever but some chills. Mild runny nose but otherwise no respiratory symptoms No blood in the stools or hematemesis No cough. No rash or headache Past Medical History  Diagnosis Date  . Hyperlipidemia     takes Simvastatin daily  . Hypertrophic obstructive cardiomyopathy     takes Amoxicillin prior to dental work and Metoprolol daily  . Psoriasis     uses Temovate cream as needed  . Allergic rhinitis   . Parkinsonism ([place])     takes Mirapex tid and Sinemet tid as well  . Heart murmur   . Atrial fibrillation ([place]) 09/2008    hx of-takes Norpace daily  . Atrial flutter ([place]) 09/2008    hx of  . Neuropathy ([place])   . Peripheral edema     right foot-seeing Dr.Keltie Labell on Wed about this  . Hemorrhoids   . Enlarged prostate but doesn't take any meds  . Cataract     immature but not sure which eye  . Insomnia     but no meds required    Past Surgical History  Procedure Laterality Date  . Colonoscopy      x 2  . Polypectomy    . Tonsillectomy      as a child  . Inguinal hernia repair Bilateral 05/14/2013    Procedure: LAPAROSCOPIC INGUINAL HERNIA REPAIR ;  Surgeon: Harl Bowie, MD;  Location: [facility];  Service: General;  Laterality: Bilateral;  . Insertion of mesh Bilateral 05/14/2013    Procedure: INSERTION OF MESH;  Surgeon: Harl Bowie, MD;  Location: [facility];  Service:  General;  Laterality: Bilateral;    Social History   Social History  . Marital Status: Married    Spouse Name: Pamala Hurry  . Number of Children: 0  . Years of Education: N/A   Occupational History  . [employer] and [employer] professor  .     Social History Main Topics  . Smoking status: Former Smoker    Types: Cigarettes  . Smokeless tobacco: Never Used     Comment: quit smoking  in the 70s  . Alcohol Use: 1.8 oz/week    3 Standard drinks or equivalent per week     Comment: beer a couple of times a week  . Drug Use: Yes     Comment: Smokes marijuana once a week  . Sexual Activity: Yes   Other Topics Concern  . Not on file   Social History Narrative        Medication List       This list is accurate as of: 07/28/15 11:59 PM.  Always use your most recent med list.               amoxicillin 500 MG capsule  Commonly known as:  AMOXIL  Take 2,000 mg by mouth See  admin instructions. Reported on 07/28/2015     aspirin EC 325 MG tablet  Take 325 mg by mouth daily.     carbidopa-levodopa 50-200 MG tablet  Commonly known as:  SINEMET CR  Take 1 tablet by mouth at bedtime.     carbidopa-levodopa 25-100 MG tablet  Commonly known as:  SINEMET IR  Take 2 tablets by mouth 3 (three) times daily.     clobetasol cream 0.05 %  Commonly known as:  TEMOVATE  Apply 1 application topically 2 (two) times daily as needed (for skin irritation).     disopyramide 150 MG capsule  Commonly known as:  NORPACE  TAKE ONE CAPSULE BY MOUTH TWICE DAILY     gabapentin 300 MG capsule  Commonly known as:  NEURONTIN  1 in the AM, 2 at bedtime     Melatonin 3 MG Caps  Take 1 capsule by mouth at bedtime.     metoprolol succinate 25 MG 24 hr tablet  Commonly known as:  TOPROL-XL  Take 25 mg by mouth daily.     ondansetron 4 MG disintegrating tablet  Commonly known as:  ZOFRAN ODT  Take 1 tablet (4 mg total) by mouth every 8 (eight) hours as needed for  nausea or vomiting.     pramipexole 0.5 MG tablet  Commonly known as:  MIRAPEX  Take 0.5 tablets (0.25 mg total) by mouth 3 (three) times daily.     vitamin B-12 1000 MCG tablet  Commonly known as:  CYANOCOBALAMIN  Take 1,000 mcg by mouth daily.           Objective:   Physical Exam BP 102/66 mmHg  Pulse 66  Temp(Src) 97.4 F (36.3 C) (Oral)  Ht 6\' 5"  (1.956 m)  Wt 223 lb 2 oz (101.209 kg)  BMI 26.45 kg/m2  SpO2 97% supine: 112/74,  pulse 62 Standing 99/66 , pulse 71  General:   Well developed, well nourished . NAD.  HEENT:  Normocephalic . Face symmetric, atraumatic Oral membranes are slightly dry. Not pale Lungs:  CTA B Normal respiratory effort, no intercostal retractions, no accessory muscle use. Heart: RRR,  + systolic murmur.  no pretibial edema bilaterally  Abdomen:  Not distended, overactive bowel sounds, some are high pitched?. Abdomen is soft. No rebound, rigidity, mass. Skin: Not pale. Not jaundice Neurologic:  alert & oriented X3.  Speech normal, gait appropriate for age and unassisted Psych--  Cognition and judgment appear intact.  Cooperative with normal attention span and concentration.  Behavior appropriate. No anxious or depressed appearing.    Assessment & Plan:   Assessment >  Hyperlipidemia Atrial fibrillation-flutter 2010 Hypertrophic obstructive cardiomyopathy  (Dr Stanford Breed) Parkinsonism (Dr Tat) Neuropathy Psoriasis BPH B12 deficiency    PLAN:  Acute gastroenteritis : Sx c/w acute gastroenteritis, he does not look toxic, he is mildly to moderately dehydrated based on history and vital signs. A similar case was seen yesterday at the office, viral gastroenteritis?. We discussed possibly ER visit for hydration and lab/x-rays or  outpatient treatment with instructions to go to the ER if clinically getting worse. He preferred the outpatient option. Plan: Abdominal x-ray, CBC, CMP, fluids, Zofran, Pepto-Bismol.   See AVS (?[place]oro  virus)

## 2015-07-28 NOTE — Patient Instructions (Signed)
GO TO THE LAB : Get the blood work    GO TO THE FRONT DESK Schedule a routine office visit or check up to be done in 3 months   Please be fasting    STOP BY THE FIRST FLOOR:  get the XR    Drink plenty of clear fluids, Gatorade, soup and crackers Take the nausea medication as needed-- zofran peptobismol as needed for diarrhea  Call gradually improving over the next 2 days   Go to the ER if severe symptoms, fever, headache, unable to drink fluids, get a lot weaker or dizzy. Also call or go  to the ER if you see blood in the stools or you have severe abdominal pain.     Norovirus Infection A norovirus infection is caused by exposure to a virus in a group of similar viruses (noroviruses). This type of infection causes inflammation in your stomach and intestines (gastroenteritis). Norovirus is the most common cause of gastroenteritis. It also causes food poisoning. Anyone can get a norovirus infection. It spreads very easily (contagious). You can get it from contaminated food, water, surfaces, or other people. Norovirus is found in the stool or vomit of infected people. You can spread the infection as soon as you feel sick until 2 weeks after you recover.  Symptoms usually begin within 2 days after you become infected. Most norovirus symptoms affect the digestive system. CAUSES Norovirus infection is caused by contact with norovirus. You can catch norovirus if you:  Eat or drink something contaminated with norovirus.  Touch surfaces or objects contaminated with norovirus and then put your hand in your mouth.  Have direct contact with an infected person who has symptoms.  Share food, drink, or utensils with someone with who is sick with norovirus. SIGNS AND SYMPTOMS Symptoms of norovirus may include:  Nausea.  Vomiting.  Diarrhea.  Stomach cramps.  Fever.  Chills.  Headache.  Muscle aches.  Tiredness. DIAGNOSIS Your health care provider may suspect norovirus based on  your symptoms and physical exam. Your health care provider may also test a sample of your stool or vomit for the virus.  TREATMENT There is no specific treatment for norovirus. Most people get better without treatment in about 2 days. HOME CARE INSTRUCTIONS  Replace lost fluids by drinking plenty of water or rehydration fluids containing important minerals called electrolytes. This prevents dehydration. Drink enough fluid to keep your urine clear or pale yellow.  Do not prepare food for others while you are infected. Wait at least 3 days after recovering from the illness to do that. PREVENTION   Wash your [place] often, especially after using the toilet or changing a diaper.  Wash fruits and vegetables thoroughly before preparing or serving them.  Throw out any food that a sick person may have touched.  Disinfect contaminated surfaces immediately after someone in the household has been sick. Use a bleach-based household cleaner.  Immediately remove and wash soiled clothes or sheets. SEEK MEDICAL CARE IF:  Your vomiting, diarrhea, and stomach pain is getting worse.  Your symptoms of norovirus do not go away after 2-3 days. SEEK IMMEDIATE MEDICAL CARE IF:  You develop symptoms of dehydration that do not improve with fluid replacement. This may include:  Excessive sleepiness.  Lack of tears.  Dry mouth.  Dizziness when standing.  Weak pulse.   This information is not intended to replace advice given to you by your health care provider. Make sure you discuss any questions you have with your  health care provider.   Document Released: 08/04/2002 Document Revised: 06/04/2014 Document Reviewed: 10/22/2013 Elsevier Interactive Patient Education [employer].

## 2015-07-28 NOTE — Progress Notes (Signed)
Pre visit review using our clinic review tool, if applicable. No additional management support is needed unless otherwise documented below in the visit note. 

## 2015-07-29 ENCOUNTER — Telehealth: Payer: Self-pay | Admitting: Internal Medicine

## 2015-07-29 NOTE — Telephone Encounter (Signed)
Error

## 2015-07-29 NOTE — Assessment & Plan Note (Signed)
Acute gastroenteritis : Sx c/w acute gastroenteritis, he does not look toxic, he is mildly to moderately dehydrated based on history and vital signs. A similar case was seen yesterday at the office, viral gastroenteritis?. We discussed possibly ER visit for hydration and lab/x-rays or  outpatient treatment with instructions to go to the ER if clinically getting worse. He preferred the outpatient option. Plan: Abdominal x-ray, CBC, CMP, fluids, Zofran, Pepto-Bismol.   See AVS (?Noro virus)

## 2015-08-15 ENCOUNTER — Ambulatory Visit: Payer: BC Managed Care – PPO | Admitting: Internal Medicine

## 2015-10-10 ENCOUNTER — Encounter: Payer: Self-pay | Admitting: Internal Medicine

## 2015-10-10 ENCOUNTER — Ambulatory Visit (INDEPENDENT_AMBULATORY_CARE_PROVIDER_SITE_OTHER): Payer: Medicare Other | Admitting: Internal Medicine

## 2015-10-10 VITALS — BP 116/62 | HR 69 | Temp 98.2°F | Ht 77.0 in | Wt 227.4 lb

## 2015-10-10 DIAGNOSIS — M25562 Pain in left knee: Secondary | ICD-10-CM

## 2015-10-10 DIAGNOSIS — Z09 Encounter for follow-up examination after completed treatment for conditions other than malignant neoplasm: Secondary | ICD-10-CM

## 2015-10-10 NOTE — Progress Notes (Signed)
Subjective:    Patient ID: Jeff [name], male    DOB: [birth year], 65 y.o.   MRN: [id number]  DOS:  10/10/2015 Type of visit - description :  Acute visit, 2 issues Interval history: About a month ago he was getting out of his car, he shifted the left knee and shortly after he had pain, since then he has had discomfort in the knee and some pain if walks > than 1  mile. Has not taken any medication, no actual swelling.  Also, in the last few months his upper abdomen has been more prominent and it feels hard to him.  Wt Readings from Last 3 Encounters:  10/10/15 227 lb 6 oz (103.137 kg)  07/28/15 223 lb 2 oz (101.209 kg)  06/07/15 230 lb (104.327 kg)     Review of Systems Denies lack of appetite however he has been very careful with his food and has lost a few pounds on purpose No postprandial pain or symptoms No dysphagia or odynophagia No nausea, vomiting, diarrhea or blood in the stools. Stools appearance normal  Past Medical History  Diagnosis Date  . Hyperlipidemia     takes Simvastatin daily  . Hypertrophic obstructive cardiomyopathy     takes Amoxicillin prior to dental work and Metoprolol daily  . Psoriasis     uses Temovate cream as needed  . Allergic rhinitis   . Parkinsonism ([place])     takes Mirapex tid and Sinemet tid as well  . Heart murmur   . Atrial fibrillation ([place]) 09/2008    hx of-takes Norpace daily  . Atrial flutter ([place]) 09/2008    hx of  . Neuropathy ([place])   . Peripheral edema     right foot-seeing Dr.Keiyana Stehr on Wed about this  . Hemorrhoids   . Enlarged prostate but doesn't take any meds  . Cataract     immature but not sure which eye  . Insomnia     but no meds required    Past Surgical History  Procedure Laterality Date  . Colonoscopy      x 2  . Polypectomy    . Tonsillectomy      as a child  . Inguinal hernia repair Bilateral 05/14/2013    Procedure: LAPAROSCOPIC INGUINAL HERNIA REPAIR ;  Surgeon: Harl Bowie, MD;  Location: [facility];  Service: General;  Laterality: Bilateral;  . Insertion of mesh Bilateral 05/14/2013    Procedure: INSERTION OF MESH;  Surgeon: Harl Bowie, MD;  Location: [facility];  Service: General;  Laterality: Bilateral;    Social History   Social History  . Marital Status: Married    Spouse Name: Pamala Hurry  . Number of Children: 0  . Years of Education: N/A   Occupational History  . [employer] and [employer] professor  .     Social History Main Topics  . Smoking status: Former Smoker    Types: Cigarettes  . Smokeless tobacco: Never Used     Comment: quit smoking  in the 70s  . Alcohol Use: 1.8 oz/week    3 Standard drinks or equivalent per week     Comment: beer a couple of times a week  . Drug Use: Yes     Comment: Smokes marijuana once a week  . Sexual Activity: Yes   Other Topics Concern  . Not on file   Social History Narrative        Medication List  This list is accurate as of: 10/10/15  2:41 PM.  Always use your most recent med list.               amoxicillin 500 MG capsule  Commonly known as:  AMOXIL  Take 2,000 mg by mouth See admin instructions. Reported on 10/10/2015     aspirin EC 325 MG tablet  Take 325 mg by mouth daily.     carbidopa-levodopa 50-200 MG tablet  Commonly known as:  SINEMET CR  Take 1 tablet by mouth at bedtime.     carbidopa-levodopa 25-100 MG tablet  Commonly known as:  SINEMET IR  Take 2 tablets by mouth 3 (three) times daily.     clobetasol cream 0.05 %  Commonly known as:  TEMOVATE  Apply 1 application topically 2 (two) times daily as needed (for skin irritation).     disopyramide 150 MG capsule  Commonly known as:  NORPACE  TAKE ONE CAPSULE BY MOUTH TWICE DAILY     gabapentin 300 MG capsule  Commonly known as:  NEURONTIN  1 in the AM, 2 at bedtime     Melatonin 3 MG Caps  Take 1 capsule by mouth at bedtime.     metoprolol succinate 25 MG 24 hr tablet  Commonly known as:   TOPROL-XL  Take 25 mg by mouth daily.     ondansetron 4 MG disintegrating tablet  Commonly known as:  ZOFRAN ODT  Take 1 tablet (4 mg total) by mouth every 8 (eight) hours as needed for nausea or vomiting.     pramipexole 0.5 MG tablet  Commonly known as:  MIRAPEX  Take 0.5 tablets (0.25 mg total) by mouth 3 (three) times daily.     vitamin B-12 1000 MCG tablet  Commonly known as:  CYANOCOBALAMIN  Take 1,000 mcg by mouth daily.           Objective:   Physical Exam BP 116/62 mmHg  Pulse 69  Temp(Src) 98.2 F (36.8 C) (Oral)  Ht 6\' 5"  (1.956 m)  Wt 227 lb 6 oz (103.137 kg)  BMI 26.96 kg/m2  SpO2 97% General:   Well developed, well nourished . NAD.  HEENT:  Normocephalic . Face symmetric, atraumatic Lungs:  CTA B Normal respiratory effort, no intercostal retractions, no accessory muscle use. Heart: RRR,  no murmur.  no pretibial edema bilaterally  Abdomen:  Not distended, soft, non-tender. No rebound or rigidity. No hepato-megaly on exam. + Diathesis recti with Valsalva MSK: Right knee normal Left knee: No obvious effusion or deformity, slightly TTP on the sides of the joint. Range of motion normal Skin: Not pale. Not jaundice Neurologic:  alert & oriented X3.  Speech normal, gait appropriate for age and unassisted Psych--  Cognition and judgment appear intact.  Cooperative with normal attention span and concentration.  Behavior appropriate. No anxious or depressed appearing.    Assessment & Plan:   Assessment >  Hyperlipidemia Atrial fibrillation-flutter 2010 Hypertrophic obstructive cardiomyopathy  (Dr Stanford Breed) Parkinsonism (Dr Tat) Neuropathy Psoriasis BPH B12 deficiency   drematitis-- behind ears, topical steroids prn  PLAN: Left knee pain: Meniscal tear? Discussed conservative treatment vs sports medicine referral, elected conservative treatment with ice, knee sleeve, judicious use of OTCs. If not better will call for a referral Prominent  abdomen: Pt is essentially asx from the GI standpoint, abdominal exam normal except for diathesis recti. Has lost some weight loss which may make his abdomen look more prominent. Recommend observation Refill metoprolol. Due for a  CPX, recommend to schedule one.

## 2015-10-10 NOTE — Patient Instructions (Signed)
GO TO THE FRONT DESK Schedule your next appointment for a physical exam in the next 2-3 months

## 2015-10-10 NOTE — Progress Notes (Signed)
Pre visit review using our clinic review tool, if applicable. No additional management support is needed unless otherwise documented below in the visit note. 

## 2015-10-11 NOTE — Assessment & Plan Note (Signed)
Left knee pain: Meniscal tear? Discussed conservative treatment vs sports medicine referral, elected conservative treatment with ice, knee sleeve, judicious use of OTCs. If not better will call for a referral Prominent abdomen: Pt is essentially asx from the GI standpoint, abdominal exam normal except for diathesis recti. Has lost some weight loss which may make his abdomen look more prominent. Recommend observation Refill metoprolol. Due for a  CPX, recommend to schedule one.

## 2015-10-28 ENCOUNTER — Ambulatory Visit: Payer: Medicare Other | Admitting: Internal Medicine

## 2015-11-20 ENCOUNTER — Other Ambulatory Visit: Payer: Self-pay | Admitting: Internal Medicine

## 2015-12-02 ENCOUNTER — Telehealth: Payer: Self-pay | Admitting: Neurology

## 2015-12-06 ENCOUNTER — Other Ambulatory Visit (INDEPENDENT_AMBULATORY_CARE_PROVIDER_SITE_OTHER): Payer: Medicare Other

## 2015-12-06 ENCOUNTER — Ambulatory Visit (INDEPENDENT_AMBULATORY_CARE_PROVIDER_SITE_OTHER): Payer: Medicare Other | Admitting: Neurology

## 2015-12-06 ENCOUNTER — Encounter: Payer: Self-pay | Admitting: Neurology

## 2015-12-06 ENCOUNTER — Telehealth: Payer: Self-pay | Admitting: Neurology

## 2015-12-06 VITALS — BP 122/78 | HR 62 | Ht 76.0 in | Wt 230.0 lb

## 2015-12-06 DIAGNOSIS — G20 Parkinson's disease: Secondary | ICD-10-CM

## 2015-12-06 DIAGNOSIS — Z5181 Encounter for therapeutic drug level monitoring: Secondary | ICD-10-CM | POA: Diagnosis not present

## 2015-12-06 DIAGNOSIS — G20A1 Parkinson's disease without dyskinesia, without mention of fluctuations: Secondary | ICD-10-CM

## 2015-12-06 LAB — TSH: TSH: 1.85 u[IU]/mL (ref 0.35–4.50)

## 2015-12-06 LAB — VITAMIN D 25 HYDROXY (VIT D DEFICIENCY, FRACTURES): VITD: 16.59 ng/mL — AB (ref 30.00–100.00)

## 2015-12-06 NOTE — Telephone Encounter (Signed)
-----   Message from Helen, DO sent at 12/06/2015 12:48 PM EDT ----- Tell pt TSH okay.  Tell him D is low and to take 1000of D3 per day
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
## 2015-12-06 NOTE — Telephone Encounter (Signed)
LMOM making patient aware.  

## 2015-12-06 NOTE — Patient Instructions (Signed)
1. Your provider has requested that you have labwork completed today. Please go to [facility] ([id number]) on the second floor of this building before leaving the office today. You do not need to check in. If you are not called within 15 minutes please check with the front desk.   2. Jeff [name] [phone] [email or web address]

## 2015-12-20 ENCOUNTER — Telehealth: Payer: Self-pay | Admitting: Cardiology

## 2015-12-20 ENCOUNTER — Encounter: Payer: Self-pay | Admitting: Cardiology

## 2015-12-26 NOTE — Telephone Encounter (Signed)
Closed encounter °

## 2016-01-10 ENCOUNTER — Ambulatory Visit (INDEPENDENT_AMBULATORY_CARE_PROVIDER_SITE_OTHER): Payer: Medicare Other | Admitting: Internal Medicine

## 2016-01-10 ENCOUNTER — Encounter: Payer: Self-pay | Admitting: Internal Medicine

## 2016-01-10 ENCOUNTER — Ambulatory Visit (HOSPITAL_BASED_OUTPATIENT_CLINIC_OR_DEPARTMENT_OTHER)
Admission: RE | Admit: 2016-01-10 | Discharge: 2016-01-10 | Disposition: A | Discharge status: Home / Self Care | Payer: Medicare Other | Source: Ambulatory Visit | Attending: Internal Medicine | Admitting: Internal Medicine

## 2016-01-10 VITALS — BP 110/70 | HR 66 | Temp 98.3°F | Ht 76.0 in | Wt 226.8 lb

## 2016-01-10 DIAGNOSIS — Z23 Encounter for immunization: Secondary | ICD-10-CM | POA: Diagnosis not present

## 2016-01-10 DIAGNOSIS — E785 Hyperlipidemia, unspecified: Secondary | ICD-10-CM | POA: Diagnosis not present

## 2016-01-10 DIAGNOSIS — E291 Testicular hypofunction: Secondary | ICD-10-CM

## 2016-01-10 DIAGNOSIS — R079 Chest pain, unspecified: Secondary | ICD-10-CM | POA: Diagnosis present

## 2016-01-10 DIAGNOSIS — I517 Cardiomegaly: Secondary | ICD-10-CM | POA: Diagnosis not present

## 2016-01-10 DIAGNOSIS — Z0000 Encounter for general adult medical examination without abnormal findings: Secondary | ICD-10-CM | POA: Diagnosis not present

## 2016-01-10 DIAGNOSIS — R7989 Other specified abnormal findings of blood chemistry: Secondary | ICD-10-CM

## 2016-01-10 DIAGNOSIS — I421 Obstructive hypertrophic cardiomyopathy: Secondary | ICD-10-CM | POA: Diagnosis not present

## 2016-01-10 DIAGNOSIS — Z114 Encounter for screening for human immunodeficiency virus [HIV]: Secondary | ICD-10-CM

## 2016-01-10 NOTE — Patient Instructions (Signed)
GO TO THE FRONT DESK Schedule labs to be done within few days, fasting Schedule your next appointment for a  routine checkup in 6-8 months    STOP BY THE FIRST FLOOR:  get the XR    Think about a healthcare power of attorney   Fall Prevention and Home Safety Falls cause injuries and can affect all age groups. It is possible to use preventive measures to significantly decrease the likelihood of falls. There are many simple measures which can make your home safer and prevent falls. OUTDOORS  Repair cracks and edges of walkways and driveways.  Remove high doorway thresholds.  Trim shrubbery on the main path into your home.  Have good outside lighting.  Clear walkways of tools, rocks, debris, and clutter.  Check that handrails are not broken and are securely fastened. Both sides of steps should have handrails.  Have leaves, snow, and ice cleared regularly.  Use sand or salt on walkways during winter months.  In the garage, clean up grease or oil spills. BATHROOM  Install night lights.  Install grab bars by the toilet and in the tub and shower.  Use non-skid mats or decals in the tub or shower.  Place a plastic non-slip stool in the shower to sit on, if needed.  Keep floors dry and clean up all water on the floor immediately.  Remove soap buildup in the tub or shower on a regular basis.  Secure bath mats with non-slip, double-sided rug tape.  Remove throw rugs and tripping hazards from the floors. BEDROOMS  Install night lights.  Make sure a bedside light is easy to reach.  Do not use oversized bedding.  Keep a telephone by your bedside.  Have a firm chair with side arms to use for getting dressed.  Remove throw rugs and tripping hazards from the floor. KITCHEN  Keep handles on pots and pans turned toward the center of the stove. Use back burners when possible.  Clean up spills quickly and allow time for drying.  Avoid walking on wet floors.  Avoid  hot utensils and knives.  Position shelves so they are not too high or low.  Place commonly used objects within easy reach.  If necessary, use a sturdy step stool with a grab bar when reaching.  Keep electrical cables out of the way.  Do not use floor polish or wax that makes floors slippery. If you must use wax, use non-skid floor wax.  Remove throw rugs and tripping hazards from the floor. STAIRWAYS  Never leave objects on stairs.  Place handrails on both sides of stairways and use them. Fix any loose handrails. Make sure handrails on both sides of the stairways are as long as the stairs.  Check carpeting to make sure it is firmly attached along stairs. Make repairs to worn or loose carpet promptly.  Avoid placing throw rugs at the top or bottom of stairways, or properly secure the rug with carpet tape to prevent slippage. Get rid of throw rugs, if possible.  Have an electrician put in a light switch at the top and bottom of the stairs. OTHER FALL PREVENTION TIPS  Wear low-heel or rubber-soled shoes that are supportive and fit well. Wear closed toe shoes.  When using a stepladder, make sure it is fully opened and both spreaders are firmly locked. Do not climb a closed stepladder.  Add color or contrast paint or tape to grab bars and handrails in your home. Place contrasting color strips on first  and last steps.  Learn and use mobility aids as needed. Install an electrical emergency response system.  Turn on lights to avoid dark areas. Replace light bulbs that burn out immediately. Get light switches that glow.  Arrange furniture to create clear pathways. Keep furniture in the same place.  Firmly attach carpet with non-skid or double-sided tape.  Eliminate uneven floor surfaces.  Select a carpet pattern that does not visually hide the edge of steps.  Be aware of all pets. OTHER HOME SAFETY TIPS  Set the water temperature for 120 F (48.8 C).  Keep emergency numbers  on or near the telephone.  Keep smoke detectors on every level of the home and near sleeping areas. Document Released: 05/04/2002 Document Revised: 11/13/2011 Document Reviewed: 08/03/2011 [facility] Patient Information 2015 [place], [place]. This information is not intended to replace advice given to you by your health care provider. Make sure you discuss any questions you have with your health care provider.   Preventive Care for Adults Ages 66 and over  Blood pressure check.** / Every 1 to 2 years.  Lipid and cholesterol check.**/ Every 5 years beginning at age 21.  Lung cancer screening. / Every year if you are aged 55-80 years and have a 30-pack-year history of smoking and currently smoke or have quit within the past 15 years. Yearly screening is stopped once you have quit smoking for at least 15 years or develop a health problem that would prevent you from having lung cancer treatment.  Fecal occult blood test (FOBT) of stool. / Every year beginning at age 32 and continuing until age 61. You may not have to do this test if you get a colonoscopy every 10 years.  Flexible sigmoidoscopy** or colonoscopy.** / Every 5 years for a flexible sigmoidoscopy or every 10 years for a colonoscopy beginning at age 5 and continuing until age 52.  Hepatitis C blood test.** / For all people born from [birth year] through 1966 and any individual with known risks for hepatitis C.  Abdominal aortic aneurysm (AAA) screening.** / A one-time screening for ages 56 to 53 years who are current or former smokers.  Skin self-exam. / Monthly.  Influenza vaccine. / Every year.  Tetanus, diphtheria, and acellular pertussis (Tdap/Td) vaccine.** / 1 dose of Td every 10 years.  Varicella vaccine.** / Consult your health care provider.  Zoster vaccine.** / 1 dose for adults aged 67 years or older.  Pneumococcal 13-valent conjugate (PCV13) vaccine.** / Consult your health care provider.  Pneumococcal polysaccharide  (PPSV23) vaccine.** / 1 dose for all adults aged 31 years and older.  Meningococcal vaccine.** / Consult your health care provider.  Hepatitis A vaccine.** / Consult your health care provider.  Hepatitis B vaccine.** / Consult your health care provider.  Haemophilus influenzae type b (Hib) vaccine.** / Consult your health care provider. **Family history and personal history of risk and conditions may change your health care provider's recommendations. Document Released: 07/10/2001 Document Revised: 05/19/2013 Document Reviewed: 10/09/2010 [facility] Patient Information 2015 [place], [place]. This information is not intended to replace advice given to you by your health care provider. Make sure you discuss any questions you have with your health care provider.

## 2016-01-10 NOTE — Progress Notes (Signed)
Pre visit review using our clinic review tool, if applicable. No additional management support is needed unless otherwise documented below in the visit note. 

## 2016-01-10 NOTE — Assessment & Plan Note (Addendum)
Td ~ 2014 ; pnm 23 - 12-2015; prevnar next year Shingles shot --02-2011   Colonoscopy:   11-2006, adenomatous polyp, cscope again 2014, next 5 years Prostate Ca screening: DRE -PSA wnl 9- 2015 Labs : BMP, FLP, testosterone. HIV. Counseled: Diet, exercise, healthcare power of attorney, fall prevention.

## 2016-01-10 NOTE — Progress Notes (Signed)
Subjective:    Patient ID: Jeff [name], male    DOB: [birth year], 65 y.o.   MRN: [id number]  DOS:  01/10/2016 Type of visit - description : CPX Interval history:  meds  reviewed, good compliance. No apparent side effects.   Review of Systems Constitutional: No fever. No chills. No unexplained wt changes. No unusual sweats  HEENT: No dental problems, no ear discharge, no facial swelling, no voice changes. No eye discharge, no eye  redness , no  intolerance to light   Respiratory: No wheezing , no  difficulty breathing. No cough , no mucus production  Cardiovascular:  Occasionally has pain located at the left distal anterior chest and left upper quadrant of the abdomen, described as a cramp, sometimes when he leans over, no rash, no associated with food, no exertional.  No SSCP, no leg swelling , no  Palpitations  GI: no nausea, no vomiting, no diarrhea , no  abdominal pain.  No blood in the stools. No dysphagia, no odynophagia    Endocrine: No polyphagia, no polyuria , no polydipsia  GU: No dysuria, gross hematuria, difficulty urinating. No urinary urgency, no frequency. + Decreased libido, not bothered by that  Musculoskeletal: Occasional pain at the right foot, external dorsum, mostly when he has prolonged driving. No claudication or edema  Skin: No change in the color of the skin, palor , no  Rash. Has a cherry at the anterior chest, self-monitoring: No change  Allergic, immunologic: No environmental allergies , no  food allergies  Neurological: No dizziness no  syncope. No headaches. No diplopia, no slurred, no slurred speech, no motor deficits, no facial  Numbness  Hematological: No enlarged lymph nodes, no easy bruising , no unusual bleedings  Psychiatry: No suicidal ideas, no hallucinations, no beavior problems, no confusion.  No unusual/severe anxiety, no depression   Past Medical History:  Diagnosis Date  . Allergic rhinitis   . Atrial fibrillation ([place]) 09/2008    hx of  . Atrial flutter ([place]) 09/2008   hx of  . Cataract    immature but not sure which eye  . Enlarged prostate   . Heart murmur   . Hemorrhoids    hx of  . Hyperlipidemia   . Hypertrophic obstructive cardiomyopathy    takes Amoxicillin prior to dental work and Metoprolol daily  . Insomnia    but no meds required  . Neuropathy ([place])   . Parkinsonism ([place])    takes Mirapex tid and Sinemet tid as well  . Peripheral edema    right foot  . Psoriasis    uses Temovate cream as needed    Past Surgical History:  Procedure Laterality Date  . INGUINAL HERNIA REPAIR Bilateral 05/14/2013   Procedure: LAPAROSCOPIC INGUINAL HERNIA REPAIR ;  Surgeon: Harl Bowie, MD;  Location: [facility];  Service: General;  Laterality: Bilateral;  . INSERTION OF MESH Bilateral 05/14/2013   Procedure: INSERTION OF MESH;  Surgeon: Harl Bowie, MD;  Location: [facility];  Service: General;  Laterality: Bilateral;  . POLYPECTOMY    . TONSILLECTOMY     as a child    Social History   Social History  . Marital status: Married    Spouse name: Jeff [name]  . Number of children: 0  . Years of education: N/A   Occupational History  . [employer] and [employer] professor  .  [place] History Main Topics  .  Smoking status: Former Smoker    Types: Cigarettes  . Smokeless tobacco: Never Used     Comment: quit smoking  in the 70s  . Alcohol use 1.8 oz/week    3 Standard drinks or equivalent per week     Comment: beer a couple of times a week  . Drug use:      Comment: Smokes marijuana once a week  . Sexual activity: Yes   Other Topics Concern  . Not on file   Social History Narrative  . No narrative on file     Family History  Problem Relation Age of Onset  . Alzheimer's disease Father   . Diabetes Father   . Heart disease Father     age ?  Marland Kitchen Heart murmur Brother   . Hypertension Neg Hx   . Stroke Neg Hx   . Colon cancer Neg Hx   .  Prostate cancer Neg Hx        Medication List       Accurate as of 01/10/16 11:59 PM. Always use your most recent med list.          amoxicillin 500 MG capsule Commonly known as:  AMOXIL Take 2,000 mg by mouth See admin instructions. Reported on 10/10/2015   aspirin EC 325 MG tablet Take 325 mg by mouth daily.   carbidopa-levodopa 50-200 MG tablet Commonly known as:  SINEMET CR Take 1 tablet by mouth at bedtime.   carbidopa-levodopa 25-100 MG tablet Commonly known as:  SINEMET IR Take 2 tablets by mouth 3 (three) times daily.   clobetasol cream 0.05 % Commonly known as:  TEMOVATE Apply 1 application topically 2 (two) times daily as needed (for skin irritation).   disopyramide 150 MG capsule Commonly known as:  NORPACE TAKE ONE CAPSULE BY MOUTH TWICE DAILY   gabapentin 300 MG capsule Commonly known as:  NEURONTIN 1 in the AM, 2 at bedtime   Melatonin 3 MG Caps Take 1 capsule by mouth at bedtime.   metoprolol succinate 25 MG 24 hr tablet Commonly known as:  TOPROL-XL Take 1 tablet (25 mg total) by mouth daily.   pramipexole 0.5 MG tablet Commonly known as:  MIRAPEX Take 0.5 tablets (0.25 mg total) by mouth 3 (three) times daily.   vitamin B-12 1000 MCG tablet Commonly known as:  CYANOCOBALAMIN Take 1,000 mcg by mouth daily.   Vitamin D3 2000 units Tabs Take 2,000 Units by mouth every other day.          Objective:   Physical Exam  Abdominal:    Skin:      BP 110/70 (BP Location: Left Arm, Patient Position: Sitting, Cuff Size: Normal)   Pulse 66   Temp 98.3 F (36.8 C) (Oral)   Ht 6\' 4"  (1.93 m)   Wt 226 lb 12.8 oz (102.9 kg)   SpO2 98%   BMI 27.61 kg/m   General:   Well developed, well nourished . NAD.  Neck: No  thyromegaly  HEENT:  Normocephalic . Face symmetric, atraumatic Lungs:  CTA B Normal respiratory effort, no intercostal retractions, no accessory muscle use. Heart: RRR,  + systolic murmur?Marland Kitchen  No pretibial edema bilaterally    Abdomen:  Not distended, soft, non-tender. No rebound or rigidity. Barely palpable aorta, upper abdomen. No bruit heard  today, he has intense bowel sounds. Lower extremities: Feet exam normal to inspection, palpation, good pedal pulses. Skin: Exposed areas without rash. Not pale. Not jaundice Neurologic:  alert & oriented X3.  Speech normal, gait appropriate for age and unassisted Strength symmetric and appropriate for age.  Psych: Cognition and judgment appear intact.  Cooperative with normal attention span and concentration.  Behavior appropriate. No anxious or depressed appearing.    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 H/o Slightly low  testosterone, essentially no sx except decreased libido. Patient is not bothered by that. Recheck a Testosterone, bone density test. Left abdomen/chest discomfort: Unclear etiology, will start by doing the chest x-ray. Patient to let me know if worsen Right foot pain: Rx observation Cherry spot, chest:-Observation, to change if there is any changes.  RTC 8 months.  Today,In addition to his physical exam I spent more than 18   min with the patient: >50% of the time counseling regards a number of issues including the Cherry spot in the chest, his foot pain, multiple questions answered to the best of my ability. Also reviewing the chart in regards to HOCM.

## 2016-01-11 NOTE — Assessment & Plan Note (Signed)
Hyperlipidemia: On no medications, check a FLP, BMP HOCM: Last seen by cardiology 01-2015, they Rx echo, Holter, ETT and  family to be screening. H/o Slightly low  testosterone, essentially no sx except decreased libido. Patient is not bothered by that. Recheck a Testosterone, bone density test. Left abdomen/chest discomfort: Unclear etiology, will start by doing the chest x-ray. Patient to let me know if worsen Right foot pain: Rx observation Cherry spot, chest:-Observation, to change if there is any changes.  RTC 8 months.

## 2016-01-16 NOTE — Telephone Encounter (Signed)
ok 

## 2016-01-18 ENCOUNTER — Other Ambulatory Visit (INDEPENDENT_AMBULATORY_CARE_PROVIDER_SITE_OTHER): Payer: Medicare Other

## 2016-01-18 ENCOUNTER — Other Ambulatory Visit: Payer: Medicare Other

## 2016-01-18 DIAGNOSIS — Z114 Encounter for screening for human immunodeficiency virus [HIV]: Secondary | ICD-10-CM

## 2016-01-18 DIAGNOSIS — Z0000 Encounter for general adult medical examination without abnormal findings: Secondary | ICD-10-CM | POA: Diagnosis not present

## 2016-01-18 DIAGNOSIS — R7989 Other specified abnormal findings of blood chemistry: Secondary | ICD-10-CM

## 2016-01-18 LAB — BASIC METABOLIC PANEL
BUN: 16 mg/dL (ref 6–23)
CALCIUM: 9 mg/dL (ref 8.4–10.5)
CO2: 28 mEq/L (ref 19–32)
CREATININE: 1.04 mg/dL (ref 0.40–1.50)
Chloride: 107 mEq/L (ref 96–112)
GFR: 75.96 mL/min (ref >60.00)
Glucose, Bld: 110 mg/dL — ABNORMAL HIGH (ref 70–99)
POTASSIUM: 4 meq/L (ref 3.5–5.1)
SODIUM: 141 meq/L (ref 135–145)

## 2016-01-18 LAB — LIPID PANEL
CHOL/HDL RATIO: 4
Cholesterol: 186 mg/dL (ref 0–200)
HDL: 51 mg/dL (ref >39.00)
LDL CALC: 116 mg/dL — AB (ref 0–99)
NonHDL: 135.08
Triglycerides: 96 mg/dL (ref 0.0–149.0)
VLDL: 19.2 mg/dL (ref 0.0–40.0)

## 2016-01-18 LAB — HIV ANTIBODY (ROUTINE TESTING W REFLEX): HIV: NONREACTIVE

## 2016-01-23 LAB — TESTOS,TOTAL,FREE AND SHBG (FEMALE)
Sex Hormone Binding Glob.: 43 nmol/L (ref 22–77)
TESTOSTERONE,TOTAL,LC/MS/MS: 307 ng/dL (ref 250–1100)
Testosterone, Free: 38.7 pg/mL (ref 35.0–155.0)

## 2016-01-27 ENCOUNTER — Ambulatory Visit: Payer: Medicare Other | Admitting: Cardiology

## 2016-02-17 NOTE — Progress Notes (Signed)
HPI: FU hypertrophic obstructive cardiomyopathy and atrial fibrillation. Renal Dopplers in September 2014 normal. Holter monitor in November 2014 showed sinus rhythm with occasional PVC and couplet. One 3 beat run of nonsustained ventricular tachycardia. Exercise treadmill November 2014 showed no significant arrhythmias and normal increase in systolic blood pressure with exercise. Patient also apparently found to have atrial fibrillation on previous monitor. Declined anticoagulation. Echo 10/16 showed HOCM, grade 2 DD, mild AI; mild MR; severe LAE; peak LVOT velocity of 4.3 cm; septum 2.1 cm. Abd ultrasound 11/16 showed AAA 2.2 x 2.8 cm. Holter 10/16 showed no NSVT. Since he was last seen, the patient denies any dyspnea on exertion, orthopnea, PND, pedal edema, palpitations, syncope or chest pain.    Current Outpatient Prescriptions  Medication Sig Dispense Refill  . aspirin EC 325 MG tablet Take 325 mg by mouth daily.      . carbidopa-levodopa (SINEMET CR) 50-200 MG per tablet Take 1 tablet by mouth at bedtime. 90 tablet 3  . carbidopa-levodopa (SINEMET IR) 25-100 MG tablet TAKE 2 TABLETS BY MOUTH THREE TIMES DAILY 540 tablet 0  . Cholecalciferol (VITAMIN D3) 2000 units TABS Take 2,000 Units by mouth every other day.    . clobetasol cream (TEMOVATE) [id number] % Apply 1 application topically 2 (two) times daily as needed (for skin irritation).    Marland Kitchen disopyramide (NORPACE) 150 MG capsule TAKE ONE CAPSULE BY MOUTH TWICE DAILY 180 capsule 2  . gabapentin (NEURONTIN) 300 MG capsule TAKE ONE CAPSULE BY MOUTH EVERY MORNING, 2 CAPSULES AT BEDTIME 270 capsule 0  . Melatonin 3 MG CAPS Take 1 capsule by mouth at bedtime.    . metoprolol succinate (TOPROL-XL) 25 MG 24 hr tablet Take 1 tablet (25 mg total) by mouth daily. 90 tablet 1  . pramipexole (MIRAPEX) 0.5 MG tablet Take 0.5 tablets (0.25 mg total) by mouth 3 (three) times daily. 135 tablet 3  . vitamin B-12 (CYANOCOBALAMIN) 1000 MCG tablet Take 1,000  mcg by mouth daily.     No current facility-administered medications for this visit.      Past Medical History:  Diagnosis Date  . Allergic rhinitis   . Atrial fibrillation ([place]) 09/2008   hx of  . Atrial flutter ([place]) 09/2008   hx of  . Cataract    immature but not sure which eye  . Enlarged prostate   . Heart murmur   . Hemorrhoids    hx of  . Hyperlipidemia   . Hypertrophic obstructive cardiomyopathy    takes Amoxicillin prior to dental work and Metoprolol daily  . Insomnia    but no meds required  . Neuropathy ([place])   . Parkinsonism ([place])    takes Mirapex tid and Sinemet tid as well  . Peripheral edema    right foot  . Psoriasis    uses Temovate cream as needed    Past Surgical History:  Procedure Laterality Date  . INGUINAL HERNIA REPAIR Bilateral 05/14/2013   Procedure: LAPAROSCOPIC INGUINAL HERNIA REPAIR ;  Surgeon: Harl Bowie, MD;  Location: [facility];  Service: General;  Laterality: Bilateral;  . INSERTION OF MESH Bilateral 05/14/2013   Procedure: INSERTION OF MESH;  Surgeon: Harl Bowie, MD;  Location: [facility];  Service: General;  Laterality: Bilateral;  . POLYPECTOMY    . TONSILLECTOMY     as a child    Social History   Social History  . Marital status: Married    Spouse name: Pamala Hurry  . Number  of children: 0  . Years of education: N/A   Occupational History  . [employer] and [employer] professor  .  [place] History Main Topics  . Smoking status: Former Smoker    Types: Cigarettes  . Smokeless tobacco: Never Used     Comment: quit smoking  in the 70s  . Alcohol use 1.8 oz/week    3 Standard drinks or equivalent per week     Comment: beer a couple of times a week  . Drug use:      Comment: Smokes marijuana once a week  . Sexual activity: Yes   Other Topics Concern  . Not on file   Social History Narrative  . No narrative on file    Family History  Problem Relation Age of  Onset  . Alzheimer's disease Father   . Diabetes Father   . Heart disease Father     age ?  Marland Kitchen Heart murmur Brother   . Hypertension Neg Hx   . Stroke Neg Hx   . Colon cancer Neg Hx   . Prostate cancer Neg Hx     ROS: no fevers or chills, productive cough, hemoptysis, dysphasia, odynophagia, melena, hematochezia, dysuria, hematuria, rash, seizure activity, orthopnea, PND, pedal edema, claudication. Remaining systems are negative.  Physical Exam: Well-developed well-nourished in no acute distress.  Skin is warm and dry.  HEENT is normal.  Neck is supple.  Chest is clear to auscultation with normal expansion.  Cardiovascular exam is regular rate and rhythm. 2/6 systolic murmur left sternal border that increases with Valsalva. Abdominal exam nontender or distended. No masses palpated. Extremities show no edema. neuro grossly intact  ECG-sinus rhythm with first-degree AV block.  Left bundle branch block.  A/P  1 abdominal aortic aneurysm-plan follow-up ultrasound November 2019.  2 atrial fibrillation-found on remote monitor. Patient remains in sinus rhythm. He declines anticoagulation and understands the risk of CVA. Continue disopyramide.  3 hypertrophic obstructive cardiomyopathy-continue present dose of beta blocker. He remains asymptomatic. No risk factors for sudden cardiac death as he has no family history of sudden death, no history of syncope and previous systolic blood pressure increased with exercise. Most recent holter monitor did not show nonsustained ventricular tachycardia. Will repeat exercise treadmill to assess blood pressure response to exercise, holter and ETT. Patient instructed on importance of having his sibling screen. He has no children.   Kirk Ruths, MD

## 2016-02-20 ENCOUNTER — Ambulatory Visit: Payer: BC Managed Care – PPO

## 2016-02-20 ENCOUNTER — Ambulatory Visit (INDEPENDENT_AMBULATORY_CARE_PROVIDER_SITE_OTHER): Payer: Medicare Other | Admitting: Cardiology

## 2016-02-20 ENCOUNTER — Encounter: Payer: Self-pay | Admitting: Cardiology

## 2016-02-20 ENCOUNTER — Other Ambulatory Visit: Payer: Self-pay | Admitting: Neurology

## 2016-02-20 VITALS — BP 128/74 | HR 62 | Ht 77.0 in | Wt 225.4 lb

## 2016-02-20 DIAGNOSIS — I421 Obstructive hypertrophic cardiomyopathy: Secondary | ICD-10-CM

## 2016-02-20 DIAGNOSIS — I480 Paroxysmal atrial fibrillation: Secondary | ICD-10-CM

## 2016-02-20 NOTE — Patient Instructions (Signed)
Medication Instructions:   NO CHANGE  Testing/Procedures:  Your physician has requested that you have an echocardiogram. Echocardiography is a painless test that uses sound waves to create images of your heart. It provides your doctor with information about the size and shape of your heart and how well your heart's chambers and valves are working. This procedure takes approximately one hour. There are no restrictions for this procedure.   Your physician has recommended that you wear a 24 HOUR holter monitor. Holter monitors are medical devices that record the heart's electrical activity. Doctors most often use these monitors to diagnose arrhythmias. Arrhythmias are problems with the speed or rhythm of the heartbeat. The monitor is a small, portable device. You can wear one while you do your normal daily activities. This is usually used to diagnose what is causing palpitations/syncope (passing out).   Your physician has requested that you have an exercise tolerance test. For further information please visit [email or web address]. Please also follow instruction sheet, as given.   PLEASE SCHEDULE TESTING IN October  Follow-Up:  Your physician wants you to follow-up in: [place] will receive a reminder letter in the mail two months in advance. If you don't receive a letter, please call our office to schedule the follow-up appointment.   If you need a refill on your cardiac medications before your next appointment, please call your pharmacy.

## 2016-02-28 ENCOUNTER — Telehealth (HOSPITAL_COMMUNITY): Payer: Self-pay | Admitting: Cardiology

## 2016-02-29 NOTE — Telephone Encounter (Signed)
Close encounter 

## 2016-03-05 ENCOUNTER — Ambulatory Visit (INDEPENDENT_AMBULATORY_CARE_PROVIDER_SITE_OTHER): Payer: Medicare Other | Admitting: Podiatry

## 2016-03-05 ENCOUNTER — Ambulatory Visit (INDEPENDENT_AMBULATORY_CARE_PROVIDER_SITE_OTHER): Payer: Medicare Other

## 2016-03-05 ENCOUNTER — Encounter: Payer: Self-pay | Admitting: Podiatry

## 2016-03-05 DIAGNOSIS — M79672 Pain in left foot: Secondary | ICD-10-CM

## 2016-03-05 DIAGNOSIS — M722 Plantar fascial fibromatosis: Secondary | ICD-10-CM

## 2016-03-05 DIAGNOSIS — M79671 Pain in right foot: Secondary | ICD-10-CM | POA: Diagnosis not present

## 2016-03-06 NOTE — Progress Notes (Signed)
Subjective:     Patient ID: Jeff [name], male   DOB: [birth year], 65 y.o.   MRN: [id number]  HPI patient states he needs new orthotics and that it's been an ongoing issue and the old orthotics of the beneficial but wearing out   Review of Systems     Objective:   Physical Exam Neurovascular status intact muscle strength adequate no change in health history with patient's feet showing high arch foot type with mild callus formation    Assessment:     Abnormal foot structure leading to symptoms    Plan:     Advised on orthotics and scanned for customized orthotics at the current time to reduce stress and reviewed shoe gear modifications

## 2016-03-09 ENCOUNTER — Telehealth (HOSPITAL_COMMUNITY): Payer: Self-pay

## 2016-03-09 ENCOUNTER — Ambulatory Visit (INDEPENDENT_AMBULATORY_CARE_PROVIDER_SITE_OTHER): Payer: Medicare Other | Admitting: Behavioral Health

## 2016-03-09 ENCOUNTER — Ambulatory Visit: Payer: Medicare Other

## 2016-03-09 DIAGNOSIS — Z23 Encounter for immunization: Secondary | ICD-10-CM

## 2016-03-09 NOTE — Telephone Encounter (Signed)
Encounter complete. 

## 2016-03-14 ENCOUNTER — Ambulatory Visit (HOSPITAL_COMMUNITY)
Admission: RE | Admit: 2016-03-14 | Discharge: 2016-03-14 | Disposition: A | Discharge status: Home / Self Care | Payer: Medicare Other | Source: Ambulatory Visit | Attending: Cardiology | Admitting: Cardiology

## 2016-03-14 DIAGNOSIS — I421 Obstructive hypertrophic cardiomyopathy: Secondary | ICD-10-CM

## 2016-03-14 LAB — EXERCISE TOLERANCE TEST
CHL CUP RESTING HR STRESS: 57 {beats}/min
CHL RATE OF PERCEIVED EXERTION: 17
CSEPEDS: 31 s
CSEPEW: 9.3 METS
CSEPHR: 75 %
Exercise duration (min): 7 min
MPHR: 154 {beats}/min
Peak HR: 117 {beats}/min

## 2016-03-15 ENCOUNTER — Other Ambulatory Visit: Payer: Self-pay

## 2016-03-15 ENCOUNTER — Ambulatory Visit (INDEPENDENT_AMBULATORY_CARE_PROVIDER_SITE_OTHER): Payer: Medicare Other

## 2016-03-15 ENCOUNTER — Ambulatory Visit (HOSPITAL_COMMUNITY): Payer: Medicare Other | Attending: Cardiovascular Disease

## 2016-03-15 ENCOUNTER — Other Ambulatory Visit: Payer: Self-pay | Admitting: Cardiology

## 2016-03-15 DIAGNOSIS — I421 Obstructive hypertrophic cardiomyopathy: Secondary | ICD-10-CM

## 2016-03-15 DIAGNOSIS — I493 Ventricular premature depolarization: Secondary | ICD-10-CM

## 2016-03-15 DIAGNOSIS — I480 Paroxysmal atrial fibrillation: Secondary | ICD-10-CM

## 2016-03-15 DIAGNOSIS — I080 Rheumatic disorders of both mitral and aortic valves: Secondary | ICD-10-CM | POA: Diagnosis not present

## 2016-03-16 ENCOUNTER — Inpatient Hospital Stay (HOSPITAL_COMMUNITY): Admission: RE | Admit: 2016-03-16 | Payer: Medicare Other | Source: Ambulatory Visit

## 2016-03-19 ENCOUNTER — Ambulatory Visit: Payer: Medicare Other | Admitting: Cardiology

## 2016-04-02 ENCOUNTER — Ambulatory Visit (INDEPENDENT_AMBULATORY_CARE_PROVIDER_SITE_OTHER): Payer: Medicare Other

## 2016-04-02 DIAGNOSIS — M722 Plantar fascial fibromatosis: Secondary | ICD-10-CM

## 2016-04-02 NOTE — Patient Instructions (Signed)

## 2016-04-02 NOTE — Progress Notes (Signed)
Patient presents for orthotic pick up.  Verbal and written break in and wear instructions given.  Patient will follow up in 4 weeks if symptoms worsen or fail to improve. 
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
## 2016-04-04 ENCOUNTER — Encounter: Payer: Self-pay | Admitting: Neurology

## 2016-04-04 ENCOUNTER — Ambulatory Visit (INDEPENDENT_AMBULATORY_CARE_PROVIDER_SITE_OTHER): Payer: Medicare Other | Admitting: Neurology

## 2016-04-04 VITALS — BP 138/84 | HR 60 | Ht 77.0 in | Wt 227.0 lb

## 2016-04-04 DIAGNOSIS — E538 Deficiency of other specified B group vitamins: Secondary | ICD-10-CM

## 2016-04-04 DIAGNOSIS — G20 Parkinson's disease: Secondary | ICD-10-CM | POA: Diagnosis not present

## 2016-04-04 MED ORDER — PRAMIPEXOLE DIHYDROCHLORIDE 0.25 MG PO TABS: 0.2500 mg | ORAL_TABLET | Freq: Three times a day (TID) | ORAL | 1 refills | Status: DC

## 2016-04-04 MED ORDER — CARBIDOPA-LEVODOPA 25-100 MG PO TABS: 2.0000 | ORAL_TABLET | Freq: Three times a day (TID) | ORAL | 1 refills | Status: DC

## 2016-04-04 MED ORDER — CARBIDOPA-LEVODOPA ER 50-200 MG PO TBCR: 1.0000 | EXTENDED_RELEASE_TABLET | Freq: Every day | ORAL | 3 refills | Status: DC

## 2016-04-26 ENCOUNTER — Other Ambulatory Visit: Payer: Self-pay | Admitting: Cardiology

## 2016-04-26 ENCOUNTER — Encounter: Payer: Self-pay | Admitting: Neurology

## 2016-04-26 DIAGNOSIS — I714 Abdominal aortic aneurysm, without rupture, unspecified: Secondary | ICD-10-CM

## 2016-05-01 ENCOUNTER — Encounter: Payer: Self-pay | Admitting: Cardiology

## 2016-05-03 ENCOUNTER — Ambulatory Visit (HOSPITAL_COMMUNITY)
Admission: RE | Admit: 2016-05-03 | Discharge: 2016-05-03 | Disposition: A | Discharge status: Home / Self Care | Payer: Medicare Other | Source: Ambulatory Visit | Attending: Cardiology | Admitting: Cardiology

## 2016-05-03 DIAGNOSIS — I700 Atherosclerosis of aorta: Secondary | ICD-10-CM | POA: Insufficient documentation

## 2016-05-03 DIAGNOSIS — I708 Atherosclerosis of other arteries: Secondary | ICD-10-CM | POA: Diagnosis not present

## 2016-05-03 DIAGNOSIS — I714 Abdominal aortic aneurysm, without rupture, unspecified: Secondary | ICD-10-CM

## 2016-05-07 ENCOUNTER — Other Ambulatory Visit: Payer: Self-pay | Admitting: Cardiology

## 2016-05-20 ENCOUNTER — Other Ambulatory Visit: Payer: Self-pay | Admitting: Internal Medicine

## 2016-05-24 ENCOUNTER — Other Ambulatory Visit: Payer: Self-pay | Admitting: Neurology

## 2016-07-19 ENCOUNTER — Telehealth: Payer: Self-pay | Admitting: *Deleted

## 2016-07-19 NOTE — Telephone Encounter (Signed)
Scheduled AWV for 09/17/16 @10 .

## 2016-07-30 ENCOUNTER — Other Ambulatory Visit: Payer: Self-pay | Admitting: Cardiology

## 2016-07-30 NOTE — Telephone Encounter (Signed)
Rx has been sent to the pharmacy electronically. ° °

## 2016-08-01 ENCOUNTER — Telehealth: Payer: Self-pay | Admitting: *Deleted

## 2016-08-01 NOTE — Telephone Encounter (Signed)
PA #[id number] started for norpace.

## 2016-08-02 ENCOUNTER — Telehealth: Payer: Self-pay | Admitting: Cardiology

## 2016-08-02 NOTE — Telephone Encounter (Signed)
New message     Prior authorization dept has more questions regarding disopyramide 150mg .  Please call

## 2016-08-03 NOTE — Telephone Encounter (Signed)
PA fax sheet filled out and faxed to the company

## 2016-08-06 ENCOUNTER — Ambulatory Visit: Payer: Medicare Other | Admitting: Neurology

## 2016-08-06 NOTE — Telephone Encounter (Signed)
Fax confirms PA for disopyramide approved until 05-27-17.

## 2016-08-08 ENCOUNTER — Ambulatory Visit: Payer: Medicare Other | Admitting: Neurology

## 2016-08-15 ENCOUNTER — Ambulatory Visit: Payer: Medicare Other | Admitting: Neurology
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
## 2016-08-20 ENCOUNTER — Ambulatory Visit (INDEPENDENT_AMBULATORY_CARE_PROVIDER_SITE_OTHER): Payer: Medicare Other | Admitting: Neurology

## 2016-08-20 ENCOUNTER — Encounter: Payer: Self-pay | Admitting: Neurology

## 2016-08-20 VITALS — BP 130/72 | HR 60 | Ht 77.0 in | Wt 226.0 lb

## 2016-08-20 DIAGNOSIS — G20 Parkinson's disease: Secondary | ICD-10-CM

## 2016-08-20 DIAGNOSIS — E538 Deficiency of other specified B group vitamins: Secondary | ICD-10-CM

## 2016-08-20 NOTE — Patient Instructions (Signed)
1. Decrease Pramipexole 0.25 mg to one tablet twice daily for one week  Then take one tablet once daily for one week  Then stop

## 2016-09-02 ENCOUNTER — Encounter: Payer: Self-pay | Admitting: Neurology
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
## 2016-09-05 ENCOUNTER — Ambulatory Visit (INDEPENDENT_AMBULATORY_CARE_PROVIDER_SITE_OTHER): Payer: Medicare Other | Admitting: Neurology

## 2016-09-05 ENCOUNTER — Encounter: Payer: Self-pay | Admitting: Neurology

## 2016-09-05 VITALS — BP 116/64 | Ht 77.0 in | Wt 225.0 lb

## 2016-09-05 DIAGNOSIS — G20 Parkinson's disease: Secondary | ICD-10-CM

## 2016-09-05 DIAGNOSIS — G609 Hereditary and idiopathic neuropathy, unspecified: Secondary | ICD-10-CM | POA: Diagnosis not present

## 2016-09-05 MED ORDER — ROTIGOTINE 2 MG/24HR TD PT24: 1.0000 | MEDICATED_PATCH | Freq: Every day | TRANSDERMAL | 0 refills | Status: DC

## 2016-09-10 ENCOUNTER — Ambulatory Visit: Payer: Medicare Other | Admitting: Internal Medicine

## 2016-09-12 ENCOUNTER — Encounter: Payer: Self-pay | Admitting: Neurology

## 2016-09-12 MED ORDER — ROTIGOTINE 2 MG/24HR TD PT24: 1.0000 | MEDICATED_PATCH | Freq: Every day | TRANSDERMAL | 5 refills | Status: DC

## 2016-09-12 NOTE — Progress Notes (Signed)
Subjective:   Jeff [name] is a 66 y.o. male who presents for Medicare Annual/Subsequent preventive examination.  Review of Systems:  No ROS.  Medicare Wellness Visit. Cardiac Risk Factors include: advanced age (>38men, >44 women);dyslipidemia Sleep patterns: Sleeps 5-6 hrs per night.Wakes 1-2x/night. Takes Melatonin 3mg  nightly. Home Safety/Smoke Alarms:  Feels safe in home. Smoke alarms in place.  Living environment; residence and Firearm Safety: Lives with wife. No stairs. Guns safely stored. Seat Belt Safety/Bike Helmet: Wears seat belt.   Counseling:   Eye Exam- Wears glasses. Dr.Shapiro.Pt states he will schedule appt soon. Dental-Dr.Johnson every 6 months.  Male:   CCS- Last 11/20/12: 2 adenomatous polyps removed. Mild diverticulosis. Repeat in 5 yrs per report.    PSA-  Lab Results  Component Value Date   PSA 1.53 02/17/2014   PSA 1.54 02/13/2013   PSA 1.39 08/23/2011        Objective:    Vitals: BP 136/78 (BP Location: Right Arm, Patient Position: Sitting, Cuff Size: Normal)   Pulse (!) 57   Ht 6\' 5"  (1.956 m)   Wt 225 lb (102.1 kg)   SpO2 98%   BMI 26.68 kg/m   Body mass index is 26.68 kg/m.  Tobacco History  Smoking Status  . Former Smoker  . Types: Cigarettes  Smokeless Tobacco  . Never Used    Comment: quit smoking  in the 70s     Counseling given: No   Past Medical History:  Diagnosis Date  . Allergic rhinitis   . Atrial fibrillation ([place]) 09/2008   hx of  . Atrial flutter ([place]) 09/2008   hx of  . Cataract    immature but not sure which eye  . Enlarged prostate   . Heart murmur   . Hemorrhoids    hx of  . Hyperlipidemia   . Hypertr obst cardiomyop    takes Amoxicillin prior to dental work and Metoprolol daily  . Insomnia    but no meds required  . Neuropathy   . Parkinsonism ([place])    takes Mirapex tid and Sinemet tid as well  . Peripheral edema    right foot  . Psoriasis    uses Temovate cream as needed   Past Surgical  History:  Procedure Laterality Date  . INGUINAL HERNIA REPAIR Bilateral 05/14/2013   Procedure: LAPAROSCOPIC INGUINAL HERNIA REPAIR ;  Surgeon: Harl Bowie, MD;  Location: [facility];  Service: General;  Laterality: Bilateral;  . INSERTION OF MESH Bilateral 05/14/2013   Procedure: INSERTION OF MESH;  Surgeon: Harl Bowie, MD;  Location: [facility];  Service: General;  Laterality: Bilateral;  . POLYPECTOMY    . TONSILLECTOMY     as a child   Family History  Problem Relation Age of Onset  . Alzheimer's disease Father   . Diabetes Father   . Heart disease Father     age ?  Marland Kitchen Heart murmur Brother   . Hypertension Neg Hx   . Stroke Neg Hx   . Colon cancer Neg Hx   . Prostate cancer Neg Hx    History  Sexual Activity  . Sexual activity: Not Currently    Outpatient Encounter Prescriptions as of 09/17/2016  Medication Sig  . aspirin EC 325 MG tablet Take 325 mg by mouth daily.    . carbidopa-levodopa (SINEMET CR) 50-200 MG tablet Take 1 tablet by mouth at bedtime.  . carbidopa-levodopa (SINEMET IR) 25-100 MG tablet Take 2 tablets by mouth 3 (three) times  daily.  . Cholecalciferol (VITAMIN D3) 2000 units TABS Take 2,000 Units by mouth every other day.  . clobetasol cream (TEMOVATE) 9.81 % Apply 1 application topically 2 (two) times daily as needed (for skin irritation).  . gabapentin (NEURONTIN) 300 MG capsule TAKE ONE CAPSULE BY MOUTH EVERY MORNING, 2 CAPSULES AT BEDTIME  . Melatonin 3 MG CAPS Take 1 capsule by mouth at bedtime.  . rotigotine (NEUPRO) 2 MG/24HR Place 1 patch onto the skin daily.  . vitamin B-12 (CYANOCOBALAMIN) 1000 MCG tablet Take 1,000 mcg by mouth daily.  . disopyramide (NORPACE) 150 MG capsule TAKE 1 CAPSULE(150 MG) BY MOUTH TWICE DAILY (Patient not taking: Reported on 09/17/2016)  . metoprolol succinate (TOPROL-XL) 25 MG 24 hr tablet Take 1 tablet (25 mg total) by mouth daily.  . [DISCONTINUED] ofloxacin (OCUFLOX) 0.3 % ophthalmic solution INT 1 GTT INTO OS QID  FOR 1 WEEK  . [DISCONTINUED] rotigotine (NEUPRO) 2 MG/24HR Place 1 patch onto the skin daily.   No facility-administered encounter medications on file as of 09/17/2016.     Activities of Daily Living In your present state of health, do you have any difficulty performing the following activities: 09/17/2016 01/10/2016  Hearing? N N  Vision? N N  Difficulty concentrating or making decisions? N N  Walking or climbing stairs? N N  Dressing or bathing? N N  Doing errands, shopping? N N  Preparing Food and eating ? N -  Using the Toilet? N -  In the past six months, have you accidently leaked urine? N -  Do you have problems with loss of bowel control? N -  Managing your Medications? N -  Managing your Finances? N -  Housekeeping or managing your Housekeeping? N -  Some recent data might be hidden    Patient Care Team: Colon Branch, MD as PCP - General Lelon Perla, MD as Consulting Physician (Cardiology) Ludwig Clarks, DO as Consulting Physician (Neurology) Rutherford Guys, MD as Consulting Physician (Ophthalmology) Wallene Huh, DPM as Consulting Physician (Podiatry)   Assessment:    Physical assessment deferred to PCP.  Exercise Activities and Dietary recommendations Current Exercise Habits: Home exercise routine, Type of exercise: walking, Time (Minutes): 15, Frequency (Times/Week): 2, Weekly Exercise (Minutes/Week): 30, Intensity: Mild, Exercise limited by: neurologic condition(s)   Diet (meal preparation, eat out, water intake, caffeinated beverages, dairy products, fruits and vegetables):  Breakfast: Cherrios, milk Lunch:  Lunchable and candy bar, coke. Dinner:  Ham, oranges, beans. Pt states he snacks a lot. Drinks 3  glasses of water per day.  Goals      Patient Stated   . I would like to walk more with friend [name].   (pt-stated)    . Would like to travel more. (pt-stated)      Fall Risk Fall Risk  09/17/2016 09/05/2016 04/04/2016 01/10/2016 12/06/2015  Falls in the  past year? No No No No No   Depression Screen PHQ 2/9 Scores 09/17/2016 01/10/2016 02/18/2015 02/11/2015  PHQ - 2 Score 0 0 0 0    Cognitive Function MMSE - Mini Mental State Exam 09/17/2016 02/18/2015  Orientation to time 5 5  Orientation to Place 5 5  Registration 3 3  Attention/ Calculation 5 5  Recall 3 3  Language- name 2 objects 2 2  Language- repeat 1 1  Language- follow 3 step command 3 3  Language- read & follow direction 1 1  Write a sentence 1 1  Copy design 1 1  Total  score 30 30        Immunization History  Administered Date(s) Administered  . Influenza Split 03/13/2011, 02/25/2012  . Influenza Whole 02/25/2009, 03/13/2010  . Influenza, High Dose Seasonal PF 03/09/2016  . Influenza,inj,Quad PF,36+ Mos 02/06/2013, 01/20/2014, 02/11/2015  . Pneumococcal Polysaccharide-23 01/10/2016  . Tdap 07/01/2012  . Zoster 03/22/2011   Screening Tests Health Maintenance  Topic Date Due  . Hepatitis C Screening  Sep 01, 1950  . INFLUENZA VACCINE  12/26/2016  . PNA vac Low Risk Adult (2 of 2 - PCV13) 01/09/2017  . COLONOSCOPY  11/20/2017  . TETANUS/TDAP  07/01/2022      Plan:     Follow up with PCP today as scheduled.  Continue to eat heart healthy diet (full of fruits, vegetables, whole grains, lean protein, water--limit salt, fat, and sugar intake) and increase physical activity as tolerated.   During the course of the visit the patient was educated and counseled about the following appropriate screening and preventive services:   Vaccines to include Pneumoccal, Influenza, Td, HCV-Needs Hep C screening today.  Cardiovascular Disease-  Colorectal cancer screening  Diabetes screening  Prostate Cancer Screening- Will discuss with PCP today.  Glaucoma screening  Nutrition counseling   Patient Instructions (the written plan) was given to the patient.    Naaman Plummer [place], [place]  09/17/2016 Kathlene November, MD

## 2016-09-12 NOTE — Progress Notes (Signed)
Pre visit review using our clinic review tool, if applicable. No additional management support is needed unless otherwise documented below in the visit note. 

## 2016-09-17 ENCOUNTER — Telehealth: Payer: Self-pay | Admitting: Cardiology

## 2016-09-17 ENCOUNTER — Ambulatory Visit (INDEPENDENT_AMBULATORY_CARE_PROVIDER_SITE_OTHER): Payer: Medicare Other | Admitting: Internal Medicine

## 2016-09-17 ENCOUNTER — Encounter: Payer: Self-pay | Admitting: Internal Medicine

## 2016-09-17 VITALS — BP 136/78 | HR 57 | Ht 77.0 in | Wt 225.0 lb

## 2016-09-17 DIAGNOSIS — Z125 Encounter for screening for malignant neoplasm of prostate: Secondary | ICD-10-CM | POA: Diagnosis not present

## 2016-09-17 DIAGNOSIS — E785 Hyperlipidemia, unspecified: Secondary | ICD-10-CM | POA: Diagnosis not present

## 2016-09-17 DIAGNOSIS — I421 Obstructive hypertrophic cardiomyopathy: Secondary | ICD-10-CM | POA: Diagnosis not present

## 2016-09-17 DIAGNOSIS — Z0000 Encounter for general adult medical examination without abnormal findings: Secondary | ICD-10-CM | POA: Diagnosis not present

## 2016-09-17 DIAGNOSIS — R7989 Other specified abnormal findings of blood chemistry: Secondary | ICD-10-CM | POA: Diagnosis not present

## 2016-09-17 DIAGNOSIS — Z1159 Encounter for screening for other viral diseases: Secondary | ICD-10-CM

## 2016-09-17 LAB — PSA: PSA: 2.71 ng/mL (ref 0.10–4.00)

## 2016-09-17 NOTE — Telephone Encounter (Signed)
Returned call to patient-wondering if he is suppose to take Norpace.   Advised her last OV note he is suppose to take 150mg  BID.  When asked by the name of disopyramide patient reports he is currently taking as he was unaware of the name Norpace.  Patient verbalized understanding.

## 2016-09-17 NOTE — Patient Instructions (Addendum)
GO TO THE LAB : Get the blood work     GO TO THE FRONT DESK Schedule your next appointment for a  CPX in 4-6 months. Fasting    Continue to eat heart healthy diet (full of fruits, vegetables, whole grains, lean protein, water--limit salt, fat, and sugar intake) and increase physical activity as tolerated.

## 2016-09-17 NOTE — Progress Notes (Signed)
Subjective:    Patient ID: Jeff [name], male    DOB: November 03, 1950, 66 y.o.   MRN: [id number]  DOS:  09/17/2016 Type of visit - description : rov Interval history: No major concerns except his chronic issues, still bothered by neuropathy and Parkinson disease. Saw cardiology, chart reviewed. Not taking Norpace, apparently just a mistake.  Review of Systems Denies chest pain, difficulty breathing or lower extremity edema No dysuria, gross hematuria.   Past Medical History:  Diagnosis Date  . Allergic rhinitis   . Atrial fibrillation ([place]) 09/2008   hx of  . Atrial flutter ([place]) 09/2008   hx of  . Cataract    immature but not sure which eye  . Enlarged prostate   . Heart murmur   . Hemorrhoids    hx of  . Hyperlipidemia   . Hypertr obst cardiomyop    takes Amoxicillin prior to dental work and Metoprolol daily  . Insomnia    but no meds required  . Neuropathy   . Parkinsonism ([place])    takes Mirapex tid and Sinemet tid as well  . Peripheral edema    right foot  . Psoriasis    uses Temovate cream as needed    Past Surgical History:  Procedure Laterality Date  . INGUINAL HERNIA REPAIR Bilateral 05/14/2013   Procedure: LAPAROSCOPIC INGUINAL HERNIA REPAIR ;  Surgeon: Harl Bowie, MD;  Location: [facility];  Service: General;  Laterality: Bilateral;  . INSERTION OF MESH Bilateral 05/14/2013   Procedure: INSERTION OF MESH;  Surgeon: Harl Bowie, MD;  Location: [facility];  Service: General;  Laterality: Bilateral;  . POLYPECTOMY    . TONSILLECTOMY     as a child    Social History   Social History  . Marital status: Married    Spouse name: Pamala Hurry  . Number of children: 0  . Years of education: N/A   Occupational History  . [employer] and [employer] professor  .  [place] History Main Topics  . Smoking status: Former Smoker    Types: Cigarettes  . Smokeless tobacco: Never Used     Comment: quit smoking   in the 70s  . Alcohol use 1.8 oz/week    3 Standard drinks or equivalent per week     Comment: beer a couple of times a week  . Drug use: Yes     Comment: Smokes marijuana once a week  . Sexual activity: Not Currently   Other Topics Concern  . Not on file   Social History Narrative  . No narrative on file      Allergies as of 09/17/2016   No Known Allergies     Medication List       Accurate as of 09/17/16  6:59 PM. Always use your most recent med list.          aspirin EC 325 MG tablet Take 325 mg by mouth daily.   carbidopa-levodopa 50-200 MG tablet Commonly known as:  SINEMET CR Take 1 tablet by mouth at bedtime.   carbidopa-levodopa 25-100 MG tablet Commonly known as:  SINEMET IR Take 2 tablets by mouth 3 (three) times daily.   clobetasol cream 0.05 % Commonly known as:  TEMOVATE Apply 1 application topically 2 (two) times daily as needed (for skin irritation).   disopyramide 150 MG capsule Commonly known as:  NORPACE TAKE 1 CAPSULE(150 MG) BY MOUTH TWICE DAILY  gabapentin 300 MG capsule Commonly known as:  NEURONTIN TAKE ONE CAPSULE BY MOUTH EVERY MORNING, 2 CAPSULES AT BEDTIME   Melatonin 3 MG Caps Take 1 capsule by mouth at bedtime.   metoprolol succinate 25 MG 24 hr tablet Commonly known as:  TOPROL-XL Take 1 tablet (25 mg total) by mouth daily.   rotigotine 2 MG/24HR Commonly known as:  NEUPRO Place 1 patch onto the skin daily.   vitamin B-12 1000 MCG tablet Commonly known as:  CYANOCOBALAMIN Take 1,000 mcg by mouth daily.   Vitamin D3 2000 units Tabs Take 2,000 Units by mouth every other day.          Objective:   Physical Exam BP 136/78 (BP Location: Right Arm, Patient Position: Sitting, Cuff Size: Normal)   Pulse (!) 57   Ht 6\' 5"  (1.956 m)   Wt 225 lb (102.1 kg)   SpO2 98%   BMI 26.68 kg/m  General:   Well developed, well nourished . NAD.  HEENT:  Normocephalic . Face symmetric, atraumatic Lungs:  CTA B Normal  respiratory effort, no intercostal retractions, no accessory muscle use. Heart: RRR,  no murmur.  No pretibial edema bilaterally  Skin: Not pale. Not jaundice Rectal:  External abnormalities: none. Normal sphincter tone. No rectal masses or tenderness.  Stool brown  Prostate: Prostate gland firm and smooth, no enlargement, nodularity, tenderness, mass, asymmetry or induration.  Neurologic:  alert & oriented X3.   Speech normal, gait appropriate for age and unassisted Psych--  Cognition and judgment appear intact.  Cooperative with normal attention span and concentration.  Behavior appropriate. No anxious or depressed appearing.      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 Wonders for how long he can work, very concerned about the issue, we talked possibly doing a formal mental check with one of the neuropsychologist at neurology, he will let me know. AAA: Follow-up by cardiology, [place] results reviewed HOCM: Saw cardiology 01-2016, note reviewed, they noted a recent Holter showed no sustained ventricular tachycardia, exercise treadmill showed a normal blood pressure response to exercise (no hypotension), nondiagnostic for ischemia Slt low testosterone: last  testosterone normal, DEXA was ordered but not done Primary care: Check a PSA ,  hepatitis C. RTC, CPX 4-6 months

## 2016-09-17 NOTE — Assessment & Plan Note (Signed)
Hyperlipidemia: Last LDL slightly up, diet encourage Parkinson, neuropathy: Per neurology. Wonders for how long he can work, very concerned about the issue, we talked possibly doing a formal mental check with one of the neuropsychologist at neurology, he will let me know. AAA: Follow-up by cardiology, [place] results reviewed HOCM: Saw cardiology 01-2016, note reviewed, they noted a recent Holter showed no sustained ventricular tachycardia, exercise treadmill showed a normal blood pressure response to exercise (no hypotension), nondiagnostic for ischemia Slt low testosterone: last  testosterone normal, DEXA was ordered but not done Primary care: Check a PSA ,  hepatitis C. RTC, CPX 4-6 months

## 2016-09-17 NOTE — Telephone Encounter (Signed)
New message    Pt is calling to ask if he should be taking Norpace. He states he saw his PCP today and was told he should be taking that medication. He is calling to find out because he does not have it on his records that he keeps.

## 2016-09-18 LAB — HEPATITIS C ANTIBODY: HCV AB: NEGATIVE

## 2016-09-18 NOTE — Telephone Encounter (Signed)
Please schedule patient for neurocognitive testing with Dr. Si Raider.

## 2016-09-19 ENCOUNTER — Encounter: Payer: Self-pay | Admitting: Psychology

## 2016-09-27 ENCOUNTER — Encounter: Payer: Self-pay | Admitting: Neurology

## 2016-10-10 ENCOUNTER — Encounter: Payer: Self-pay | Admitting: Neurology

## 2016-10-11 ENCOUNTER — Encounter: Payer: Self-pay | Admitting: Neurology

## 2016-10-12 MED ORDER — ROTIGOTINE 4 MG/24HR TD PT24: 1.0000 | MEDICATED_PATCH | Freq: Every day | TRANSDERMAL | 0 refills | Status: DC

## 2016-10-16 ENCOUNTER — Encounter: Payer: Self-pay | Admitting: Neurology

## 2016-10-24 IMAGING — CR DG CHEST 2V
2 series · 2 of 2 positions shown · non-contrast
Comparison: 05/10/2014

CLINICAL DATA: Left upper abdominal cramping for 1 year. Left chest
pain.

EXAM:
CHEST  2 VIEW

[w chest pa]
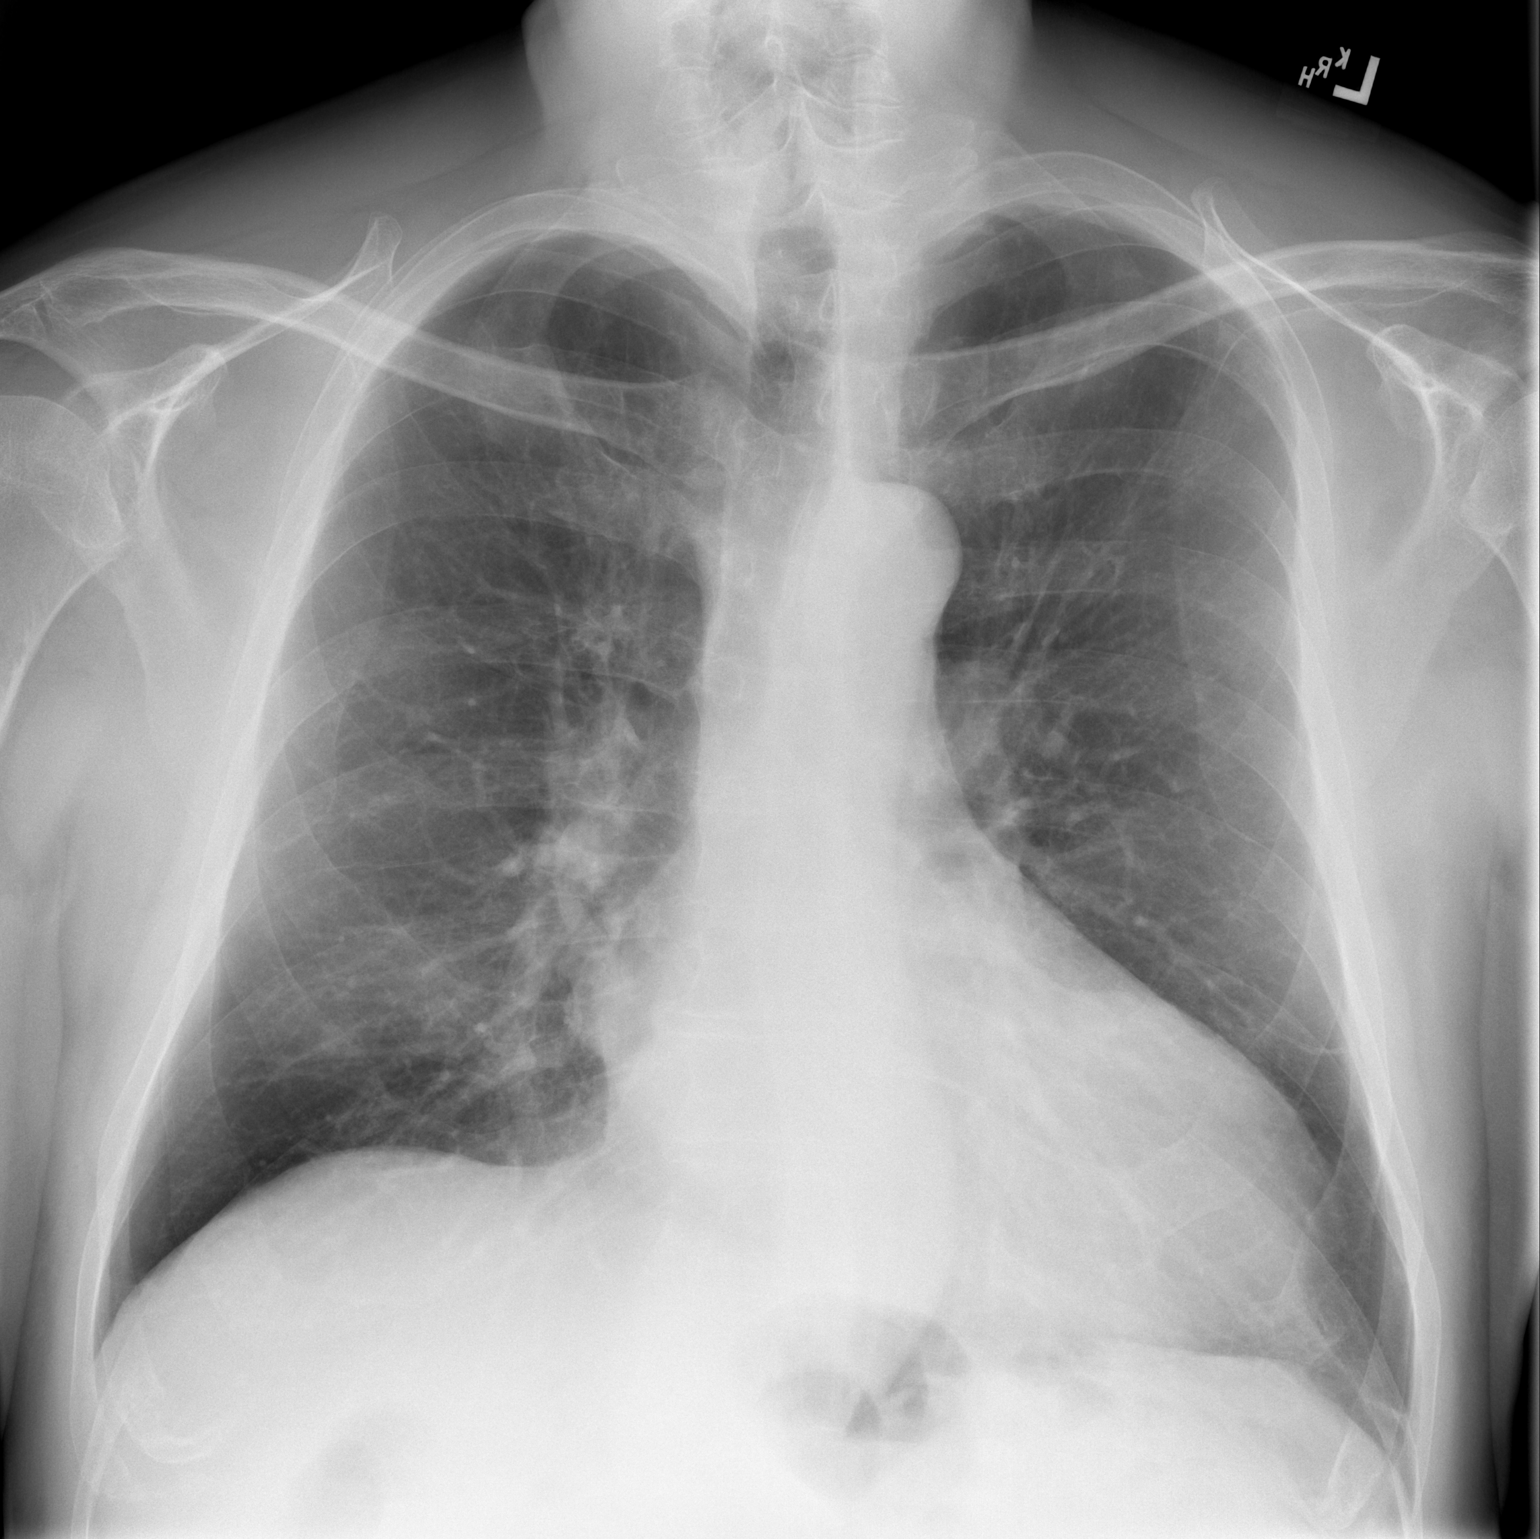

[w chest lat]
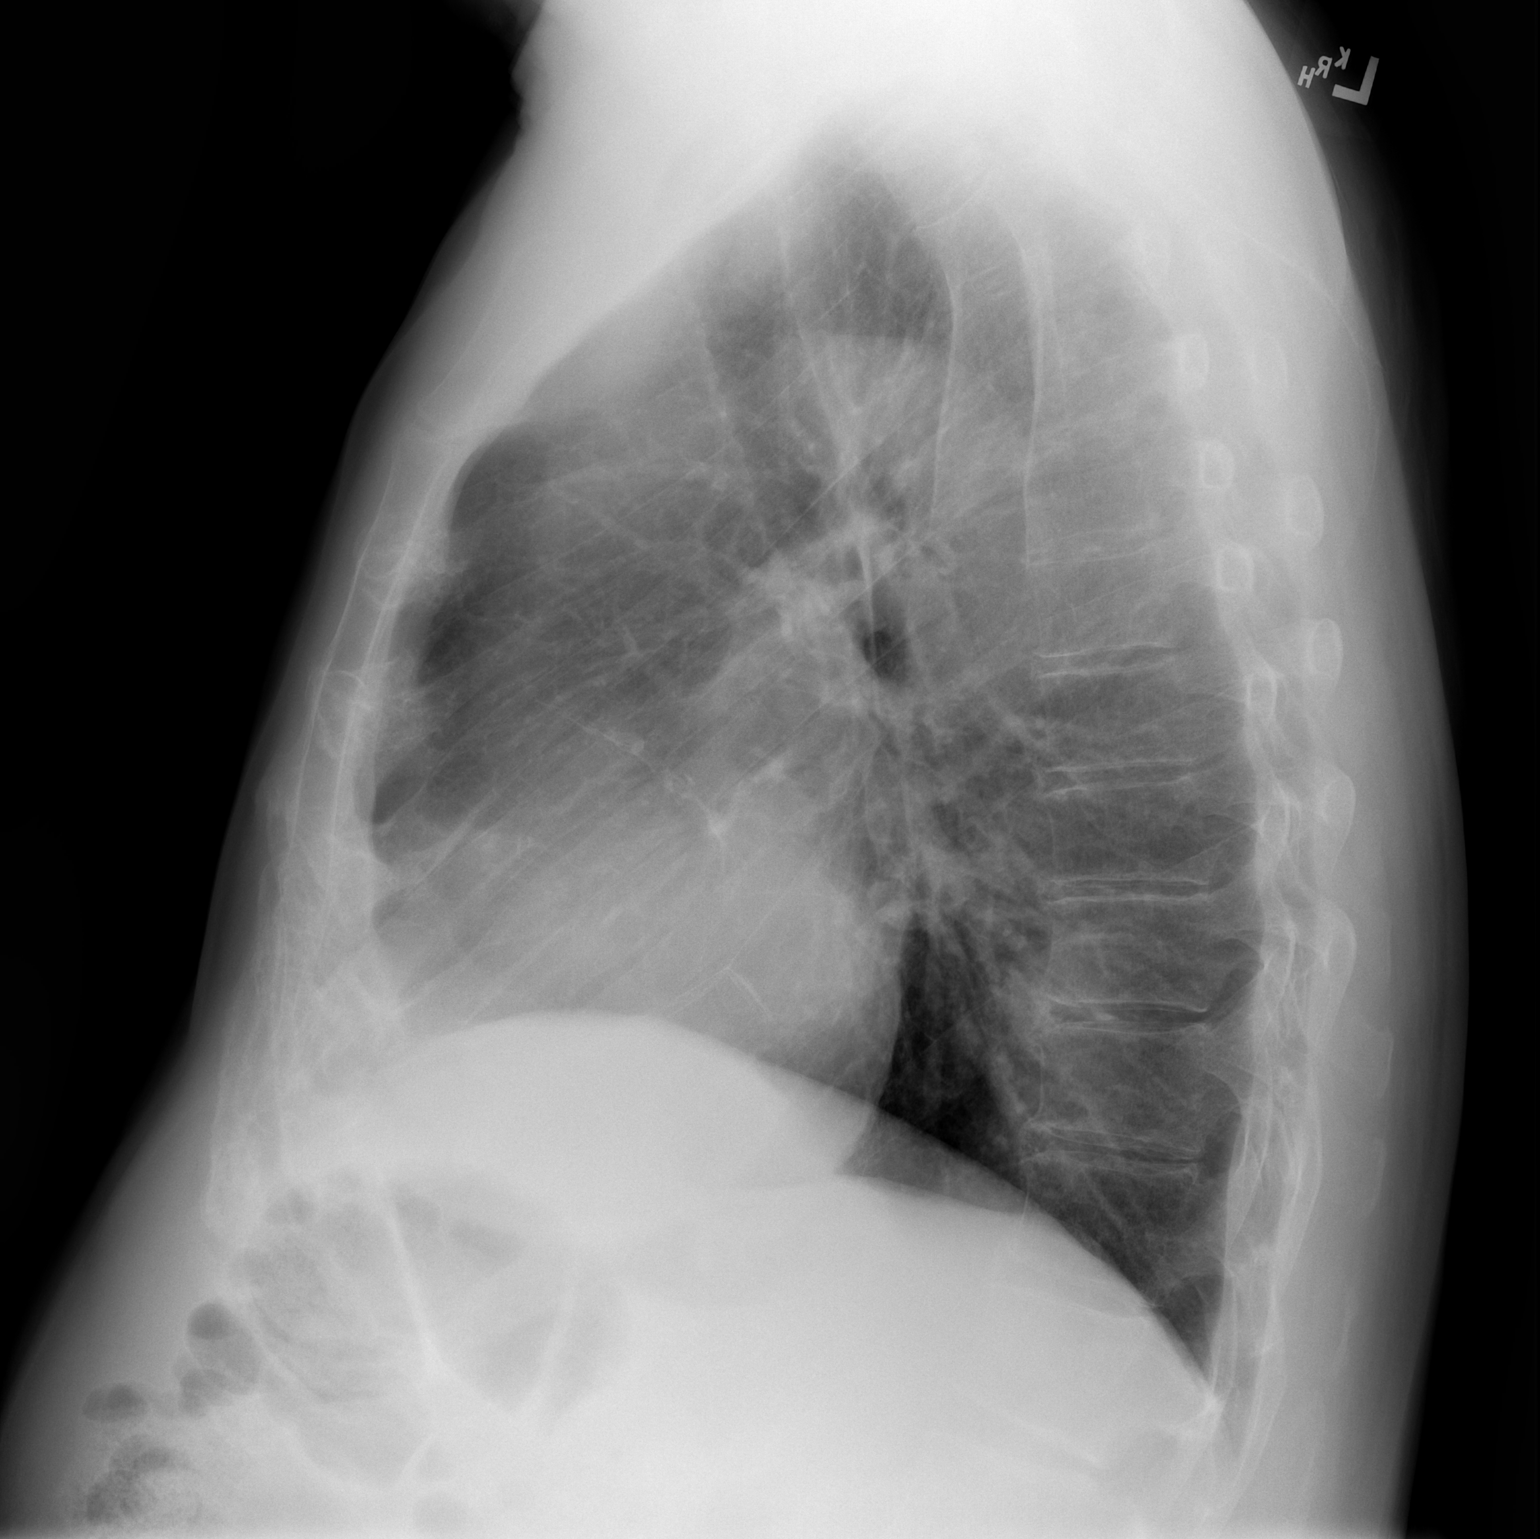

[2 of 2 positions shown; findings below may reference images not displayed]

FINDINGS: Cardiomegaly. No confluent airspace opacities, effusions or edema.
No acute bony abnormality.
IMPRESSION: Cardiomegaly.  No active disease.

## 2016-10-25 ENCOUNTER — Other Ambulatory Visit: Payer: Self-pay | Admitting: Neurology

## 2016-10-25 MED ORDER — ROTIGOTINE 4 MG/24HR TD PT24: 1.0000 | MEDICATED_PATCH | Freq: Every day | TRANSDERMAL | 2 refills | Status: DC

## 2016-10-30 ENCOUNTER — Ambulatory Visit (INDEPENDENT_AMBULATORY_CARE_PROVIDER_SITE_OTHER): Payer: Medicare Other | Admitting: Psychology

## 2016-10-30 ENCOUNTER — Encounter: Payer: Self-pay | Admitting: Psychology

## 2016-10-30 DIAGNOSIS — G20 Parkinson's disease: Secondary | ICD-10-CM | POA: Diagnosis not present

## 2016-10-30 DIAGNOSIS — R4189 Other symptoms and signs involving cognitive functions and awareness: Secondary | ICD-10-CM

## 2016-10-30 DIAGNOSIS — G20A1 Parkinson's disease without dyskinesia, without mention of fluctuations: Secondary | ICD-10-CM

## 2016-10-30 NOTE — Progress Notes (Signed)
   Neuropsychology Note  Jeff [name] came in today for 2 hours of neuropsychological testing with technician, Milana Kidney, BS, under the supervision of Dr. Macarthur Critchley. The patient did not appear overtly distressed by the testing session, per behavioral observation or via self-report to the technician. Rest breaks were offered. [name] will return within 2 weeks for a feedback session with Dr. Si Raider at which time his test performances, clinical impressions and treatment recommendations will be reviewed in detail. The patient understands he can contact our office should he require our assistance before this time.  Full report to follow.

## 2016-10-30 NOTE — Progress Notes (Signed)
NEUROPSYCHOLOGICAL INTERVIEW (CPT: [id number])  Name: Jeff [name] Date of Birth: 06/15/1950 Date of Interview: 10/30/2016  Reason for Referral:  Jeff [name] is a 66 y.o. right-handed male who is referred for neuropsychological evaluation by Dr. Wells Guiles Tat of [facility] due to concerns about cognitive decline in the context of Parkinson's disease. This patient is unaccompanied in the office for today's appointment.  History of Presenting Problem:  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 He is followed by Dr. Carles Collet. He is here for neurocognitive evaluation to establish a baseline. He does not feel that his cognitive abilities are diminishing but he states that he knows this may be coming in the future and would like to have objective data when it comes time to decide if he should continue working. He is employed as an Scientist, product/process development at [employer]. He is not having any cognitive difficulties at work. He also chairs the advisory board for the [employer], and he is involved in Parkinson's disease organizations. He does note that when reading for pleasure, it takes him longer to read books, and he does not find it to be as enjoyable. He is not having any significant problems with attention, learning, memory, visual-spatial skills or language/communication.   Physically, the patient is most bothered by his tremor predominantly in his right hand. He is also noticing his right foot starting to drag a bit. He loses balance easier upon turning suddenly. He has some dizziness upon standing up. He has not had any falls. He has had possible visual illusions (thinking he sees a bug or mouse out of the corner of his eye, but when he turns to look nothing is there). He has not had any hallucinations. He has some sleep difficulty. He has a history of sleep attacks during the day while on pramipexole. He is on Neupro patch now and not having sleep attacks. He  is having some difficulty sleeping through the night. He typically goes to bed around 10:30 pm and wakes up around 4 or 5 am. Sometimes he can go back to sleep on the couch. He does not demonstrate REM sleep behavior symptoms such as talking/moving in sleep or acting out dreams.  Mood is good and stable. Psychiatric history was denied. He has no history of mental health treatment. He denied history of substance abuse or dependence.  Family history is significant for Alzheimer's disease in his father (passed away 5 years ago, lived for 11 years after diagnosis) and Parkinson's disease in his maternal grandmother.   Social History: Born/Raised: [place], moved around as a child, eventually settled in [place] Education: [employer]  Occupational history: He was a Public relations account executive for 30 years for [employer] (taught history, Conservation officer, nature, humanities, and later astronomy). 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 He loves his work.  Marital history: Married 45 years, no children. Alcohol: Occasional beer, occasional vodka drink.  Tobacco: Former (quit in college). Substance use: Occasional marijuana (weekends/summer only).   Medical History: Past Medical History:  Diagnosis Date  . Allergic rhinitis   . Atrial fibrillation ([place]) 09/2008   hx of  . Atrial flutter ([place]) 09/2008   hx of  . Cataract    immature but not sure which eye  . Enlarged prostate   . Heart murmur   . Hemorrhoids    hx of  . Hyperlipidemia   . Hypertr obst cardiomyop    takes Amoxicillin prior  to dental work and Metoprolol daily  . Insomnia    but no meds required  . Neuropathy   . Parkinsonism ([place])    takes Mirapex tid and Sinemet tid as well  . Peripheral edema    right foot  . Psoriasis    uses Temovate cream as needed     Current Medications:  Outpatient Encounter Prescriptions as of 10/30/2016  Medication Sig  . aspirin EC 325 MG tablet Take 325 mg by  mouth daily.    . carbidopa-levodopa (SINEMET CR) 50-200 MG tablet Take 1 tablet by mouth at bedtime.  . carbidopa-levodopa (SINEMET IR) 25-100 MG tablet Take 2 tablets by mouth 3 (three) times daily.  . Cholecalciferol (VITAMIN D3) 2000 units TABS Take 2,000 Units by mouth every other day.  . clobetasol cream (TEMOVATE) 5.72 % Apply 1 application topically 2 (two) times daily as needed (for skin irritation).  Marland Kitchen disopyramide (NORPACE) 150 MG capsule TAKE 1 CAPSULE(150 MG) BY MOUTH TWICE DAILY (Patient not taking: Reported on 09/17/2016)  . gabapentin (NEURONTIN) 300 MG capsule TAKE ONE CAPSULE BY MOUTH EVERY MORNING, 2 CAPSULES AT BEDTIME  . Melatonin 3 MG CAPS Take 1 capsule by mouth at bedtime.  . metoprolol succinate (TOPROL-XL) 25 MG 24 hr tablet Take 1 tablet (25 mg total) by mouth daily.  . rotigotine (NEUPRO) 4 MG/24HR Place 1 patch onto the skin daily.  . vitamin B-12 (CYANOCOBALAMIN) 1000 MCG tablet Take 1,000 mcg by mouth daily.   No facility-administered encounter medications on file as of 10/30/2016.      Behavioral Observations:   Appearance: Neatly, casually and appropriately dressed and groomed Gait: Ambulated independently, no gross abnormalities observed Speech: Fluent; normal rate, rhythm and volume. No word finding difficulty. Thought process: Linear, goal directed Affect: Full, euthymic, bright, mildly anxious Interpersonal: Very pleasant, appropriate   TESTING: There is medical necessity to proceed with neuropsychological assessment as the results will be used to aid in differential diagnosis and clinical decision-making and to inform specific treatment recommendations. The patient has a progressive neurodegenerative disorder (PD) which can cause cognitive deficits and baseline testing is indicated to determine if there is cognitive impairment at this time, and to provide a point of comparison in the future.   Following the clinical interview, the patient completed a  full battery of neuropsychological testing with my psychometrician under my supervision.   PLAN: The patient will return to see me for a follow-up session next week at which time his test performances and my impressions and treatment recommendations will be reviewed in detail.  Full report to follow.

## 2016-11-08 NOTE — Progress Notes (Signed)
NEUROPSYCHOLOGICAL EVALUATION   Name:    Jeff [name]  Date of Birth:   [birth year] Date of Interview:  10/30/2016 Date of Testing:  10/30/2016   Date of Feedback:  11/12/2016       Background Information:  Reason for Referral:  Jeff [name] is a 66 y.o. right handed male referred by Dr. Wells Guiles Tat to assess his current level of cognitive functioning and assist in differential diagnosis. The current evaluation consisted of a review of available medical records, an interview with the patient, and the completion of a neuropsychological testing battery. Informed consent was obtained.  History of Presenting Problem:  Jeff [name] was diagnosed with idiopathic tremor-predominant PD in 07/2011. He is followed by Dr. Carles Collet. He is here for neurocognitive evaluation to establish a baseline. He does not feel that his cognitive abilities are diminishing but he states that he knows this may be coming in the future and would like to have objective data when it comes time to decide if he should continue working. He is employed as an Scientist, product/process development at [employer]. He is not having any cognitive difficulties at work. He also chairs the advisory board for the [employer], and he is involved in Parkinson's disease organizations. He does note that when reading for pleasure, it takes him longer to read books, and he does not find it to be as enjoyable. He is not having any significant problems with attention, learning, memory, visual-spatial skills or language/communication.   Physically, the patient is most bothered by his tremor predominantly in his right hand. He is also noticing his right foot starting to drag a bit. He loses balance easier upon turning suddenly. He has some dizziness upon standing up. He has not had any falls. He has had possible visual illusions (thinking he sees a bug or mouse out of the corner of his eye, but when he turns to look nothing is there). He has not had any  hallucinations. He has some sleep difficulty. He has a history of sleep attacks during the day while on pramipexole. He is on Neupro patch now and not having sleep attacks. He is having some difficulty sleeping through the night. He typically goes to bed around 10:30 pm and wakes up around 4 or 5 am. Sometimes he can go back to sleep on the couch. He does not demonstrate REM sleep behavior symptoms such as talking/moving in sleep or acting out dreams.  Mood is good and stable. Psychiatric history was denied. He has no history of mental health treatment. He denied history of substance abuse or dependence.  Family history is significant for Alzheimer's disease in his father (passed away 5 years ago, lived for 11 years after diagnosis) and Parkinson's disease in his maternal grandmother.   Social History: Born/Raised: [place], moved around as a child, eventually settled in [place] Education: [employer]  Occupational history: He was a Public relations account executive for 30 years for [employer] (taught history, Conservation officer, nature, humanities, and later astronomy). 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 He loves his work.  Marital history: Married 45 years, no children. Alcohol: Occasional beer, occasional vodka drink.  Tobacco: Former (quit in college). Substance use: Occasional marijuana (weekends/summer only).   Medical History:  Past Medical History:  Diagnosis Date  . Allergic rhinitis   . Atrial fibrillation ([place]) 09/2008   hx of  . Atrial flutter ([place]) 09/2008   hx of  . Cataract  immature but not sure which eye  . Enlarged prostate   . Heart murmur   . Hemorrhoids    hx of  . Hyperlipidemia   . Hypertr obst cardiomyop    takes Amoxicillin prior to dental work and Metoprolol daily  . Insomnia    but no meds required  . Neuropathy   . Parkinsonism ([place])    takes Mirapex tid and Sinemet tid as well  . Peripheral edema    right foot  .  Psoriasis    uses Temovate cream as needed    Current medications:  Outpatient Encounter Prescriptions as of 11/12/2016  Medication Sig  . aspirin EC 325 MG tablet Take 325 mg by mouth daily.    . carbidopa-levodopa (SINEMET CR) 50-200 MG tablet Take 1 tablet by mouth at bedtime.  . carbidopa-levodopa (SINEMET IR) 25-100 MG tablet Take 2 tablets by mouth 3 (three) times daily.  . Cholecalciferol (VITAMIN D3) 2000 units TABS Take 2,000 Units by mouth every other day.  . clobetasol cream (TEMOVATE) 6.56 % Apply 1 application topically 2 (two) times daily as needed (for skin irritation).  Marland Kitchen disopyramide (NORPACE) 150 MG capsule TAKE 1 CAPSULE(150 MG) BY MOUTH TWICE DAILY (Patient not taking: Reported on 09/17/2016)  . gabapentin (NEURONTIN) 300 MG capsule TAKE ONE CAPSULE BY MOUTH EVERY MORNING, 2 CAPSULES AT BEDTIME  . Melatonin 3 MG CAPS Take 1 capsule by mouth at bedtime.  . metoprolol succinate (TOPROL-XL) 25 MG 24 hr tablet Take 1 tablet (25 mg total) by mouth daily.  . rotigotine (NEUPRO) 4 MG/24HR Place 1 patch onto the skin daily.  . vitamin B-12 (CYANOCOBALAMIN) 1000 MCG tablet Take 1,000 mcg by mouth daily.   No facility-administered encounter medications on file as of 11/12/2016.      Current Examination:  Behavioral Observations:   Appearance: Neatly, casually and appropriately dressed and groomed Gait: Ambulated independently, no gross abnormalities observed Speech: Fluent; normal rate, rhythm and volume. No word finding difficulty. Thought process: Linear, goal directed Affect: Full, euthymic, bright, mildly anxious Interpersonal: Very pleasant, appropriate Orientation: Oriented to all spheres. Accurately named the current President and his predecessor.   Tests Administered: . Test of Premorbid Functioning (TOPF) . Wechsler Adult Intelligence Scale-Fourth Edition (WAIS-IV): Similarities, Block Design, Matrix Reasoning, Arithmetic, Symbol Search, Coding and Digit Span  subtests . Wechsler Memory Scale-Fourth Edition (WMS-IV) Adult Version (ages 39-69): Logical Memory I, II and Recognition subtests  . [place] Verbal Learning Test - 2nd Edition (CVLT-2) Standard Form . [employer] (WCST) . Repeatable Battery for the Assessment of Neuropsychological Status (RBANS) Form A:  Figure Copy and Figure Recall subtests and Semantic Fluency subtest . Neuropsychological Assessment Battery (NAB) Language Module Form1 : Naming subtest . Controlled Oral Word Association Test (COWAT) . Trail Making Test A and B . Boston Diagnostic Aphasia Examination (BDAE): Complex Ideational Material Subtest . Clock Drawing Test . Beck Depression Inventory - Second edition (BDI-II) . Generalized Anxiety Disorder - 7 item screener (GAD-7) . Parkinson's Disease Questionnaire (PDQ-39)  Test Results: Note: Standardized scores are presented only for use by appropriately trained professionals and to allow for any future test-retest comparison. These scores should not be interpreted without consideration of all the information that is contained in the rest of the report. The most recent standardization samples from the test publisher or other sources were used whenever possible to derive standard scores; scores were corrected for age, gender, ethnicity and education when available.   Test Scores:  Test Name  Raw Score Standardized Score Descriptor  TOPF 60/70 SS= 117 High average  WAIS-IV Subtests     Similarities 34/36 ss= 16 Very superior  Block Design 57/66 ss= 16 Very superior  Matrix Reasoning 13/26 ss= 10 Average  Arithmetic 13/22 ss= 9 Average  Symbol Search 25/60 ss= 10 Average  Coding 5/135 ss= 11 Average  Digit Span 27/48 ss= 11 Average  WAIS-IV Index Scores     Working Memory  SS= 100 Average  Processing Speed  SS= 102 Average  WMS-IV Subtests     LM I 43/53 ss= 15 Superior  LM II 28/39 ss= 14 Superior  LM II Recognition 22/23 Cum %: >75 Above average    CVLT-II Scores     Trial 1 5/16 Z= -0.5 Average  Trial 5 10/16 Z= 0 Average  Trials 1-5 total 46/80 T= 56 Average  SD Free Recall 7/16 Z= -0.5 Average  SD Cued Recall 10/16 Z= 0 Average  LD Free Recall 8/16 Z= 0 Average  LD Cued Recall 10/16 Z= 0 Average  Recognition Discriminability 13/16 hits; 0 false positives Z= 0.5 Average  Forced Choice Recognition 16/16  WNL  WCST     Total Errors 12 T= 54 Average  Perseverative Responses 5 T= >80 Very superior  Perseverative Errors 5 T= >80 Very superior  Conceptual Level Responses 48 T= 53 Average  Categories Completed 4 >16% WNL  Trials to Complete 1st Category 12 >16% WNL  Failure to Maintain Set 1    RBANS Subtests     Figure Copy 20/20 Z= 1.1 High average  Figure Recall 19/20 Z= 1.4 Superior  Semantic Fluency 14 Z= -1.5 Borderline  NAB Naming 31/31 T= 55 Average  COWAT-FAS 45 T= 52 Average  COWAT-Animals 20 T= 54 Average  Trail Making Test A  48" 1 error T= 40 Low average  Trail Making Test B  46" 0 errors T= 59 High average  BDAE Complex Ideational Material 12/12  WNL  Clock Drawing Test   WNL  BDI-II 9/63  WNL  GAD-7 3/21  WNL  PDQ-39     Mobility 5%    Activities of Daily Living 20.8%    Emotional Well Being 0    Stigma 18.75%    Social Support 8.33%    Cognitive Impairment 25%    Communication 25%    Bodily Discomfort 25%       Description of Test Results:  Premorbid verbal intellectual abilities were estimated to have been within the high average range based on a test of word reading. Psychomotor processing speed was average. Auditory attention and working memory were average. Visual-spatial construction was very superior. Language abilities were somewhat variable. Specifically, confrontation naming was intact with 100% accuracy, and semantic verbal fluency ranged from borderline to average. Auditory comprehension of complex ideational material was intact. With regard to verbal memory, encoding and acquisition of  non-contextual information (i.e., word list) was average. After a brief interference task, free recall was average (7/16 items recalled). He recalled an additional 3 items with semantic cueing (average). After a delay, free recall was average (8/16 items recalled). Cued recall was average (10/16 items recalled). Performance on a yes/no recognition task was average. On another verbal memory test, encoding and acquisition of contextual auditory information (i.e., short stories) was superior. After a delay, free recall was superior. Performance on a yes/no recognition task was above average. With regard to non-verbal memory, delayed free recall of visual information was superior. Executive functioning was intact overall. Mental  flexibility and set-shifting were high average on Trails B. Verbal fluency with phonemic search restrictions was average. Verbal abstract reasoning was very superior. Non-verbal abstract reasoning was average. Deductive reasoning and problem solving were very superior. Performance on a clock drawing task was intact. On a self-report measure of mood, the patient's responses were not indicative of clinically significant depression at the present time. On a self-report measure of anxiety, the patient did not endorse clinically significant generalized anxiety at the present time. On a self-report measure assessing the impact of PD on quality of life and daily functioning, he did not report any significant difficulty with mobility, emotional wellbeing, or social support. He endorsed mild interference in ADLs, cognitive functioning, communication and bodily discomfort. He reported mild stigma associated with PD symptoms.   Clinical Impressions: Diagnosis deferred. No evidence of neurocognitive dysfunction at this time. Results of cognitive testing were entirely within normal limits and commensurate with estimated premorbid intellectual abilities. Specifically, processing speed, auditory attention,  language, visual spatial skills, learning and memory, and executive functioning were all normal, with many scores falling in the high average to very superior range. There were no impaired performances on testing. There is no evidence to suggest the presence of a neurocognitive disorder, including MCI or dementia, at this time. There is also no evidence of a primary psychiatric disorder. These test results will serve as a nice baseline for future comparison if ever needed.   Feedback to Patient: Jeff [name] and his wife returned for a feedback appointment on 11/12/2016 to review the results of his neuropsychological evaluation with this provider. 35 minutes face-to-face time was spent reviewing his test results, my impressions and my recommendations as detailed above.    Total time spent on this patient's case: [id number] unit for interview with psychologist; [phone] units of testing by psychometrician under psychologist's supervision; [phone] units for medical record review, scoring of neuropsychological tests, interpretation of test results, preparation of this report, and review of results to the patient by psychologist.      Thank you for your referral of [facility]. Please feel free to contact me if you have any questions or concerns regarding this report.

## 2016-11-12 ENCOUNTER — Ambulatory Visit (INDEPENDENT_AMBULATORY_CARE_PROVIDER_SITE_OTHER): Payer: Medicare Other | Admitting: Psychology

## 2016-11-12 ENCOUNTER — Encounter: Payer: Self-pay | Admitting: Psychology

## 2016-11-12 ENCOUNTER — Other Ambulatory Visit: Payer: Self-pay | Admitting: Neurology

## 2016-11-12 DIAGNOSIS — R4189 Other symptoms and signs involving cognitive functions and awareness: Secondary | ICD-10-CM

## 2016-11-12 DIAGNOSIS — G20 Parkinson's disease: Secondary | ICD-10-CM

## 2016-11-19 ENCOUNTER — Other Ambulatory Visit: Payer: Self-pay | Admitting: Neurology

## 2016-11-19 ENCOUNTER — Other Ambulatory Visit: Payer: Self-pay | Admitting: Internal Medicine

## 2016-12-10 ENCOUNTER — Telehealth: Payer: Self-pay | Admitting: Neurology

## 2016-12-10 NOTE — Telephone Encounter (Signed)
error 
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
## 2016-12-11 ENCOUNTER — Encounter: Payer: Self-pay | Admitting: Neurology

## 2016-12-11 ENCOUNTER — Ambulatory Visit (INDEPENDENT_AMBULATORY_CARE_PROVIDER_SITE_OTHER): Payer: Medicare Other | Admitting: Neurology

## 2016-12-11 VITALS — BP 112/80 | HR 60 | Ht 77.0 in | Wt 221.0 lb

## 2016-12-11 DIAGNOSIS — G20 Parkinson's disease: Secondary | ICD-10-CM | POA: Diagnosis not present

## 2016-12-11 DIAGNOSIS — E538 Deficiency of other specified B group vitamins: Secondary | ICD-10-CM

## 2016-12-11 DIAGNOSIS — G4709 Other insomnia: Secondary | ICD-10-CM

## 2016-12-16 ENCOUNTER — Encounter: Payer: Self-pay | Admitting: Neurology

## 2016-12-18 MED ORDER — ROTIGOTINE 6 MG/24HR TD PT24: 1.0000 | MEDICATED_PATCH | Freq: Every day | TRANSDERMAL | 0 refills | Status: DC

## 2016-12-21 ENCOUNTER — Other Ambulatory Visit: Payer: Self-pay | Admitting: Neurology

## 2017-01-15 ENCOUNTER — Other Ambulatory Visit: Payer: Self-pay | Admitting: Neurology

## 2017-01-25 ENCOUNTER — Other Ambulatory Visit: Payer: Self-pay | Admitting: Cardiology

## 2017-01-25 ENCOUNTER — Ambulatory Visit (INDEPENDENT_AMBULATORY_CARE_PROVIDER_SITE_OTHER): Payer: Medicare Other | Admitting: Medical

## 2017-01-25 ENCOUNTER — Encounter: Payer: Self-pay | Admitting: Medical

## 2017-01-25 VITALS — BP 135/77 | HR 58 | Temp 97.7°F | Resp 16 | Ht 75.0 in | Wt 217.2 lb

## 2017-01-25 DIAGNOSIS — R300 Dysuria: Secondary | ICD-10-CM | POA: Diagnosis not present

## 2017-01-25 DIAGNOSIS — R350 Frequency of micturition: Secondary | ICD-10-CM

## 2017-01-25 DIAGNOSIS — N390 Urinary tract infection, site not specified: Secondary | ICD-10-CM

## 2017-01-25 LAB — PSA: PSA: 3.13 ng/mL (ref 0.10–4.00)

## 2017-01-25 LAB — POC URINALSYSI DIPSTICK (AUTOMATED)
Bilirubin, UA: NEGATIVE
Blood, UA: NEGATIVE
Glucose, UA: NEGATIVE
Ketones, UA: NEGATIVE
NITRITE UA: POSITIVE
SPEC GRAV UA: 1.025 (ref 1.010–1.025)
UROBILINOGEN UA: 2 U/dL — AB
pH, UA: 6 (ref 5.0–8.0)

## 2017-01-25 MED ORDER — CIPROFLOXACIN HCL 500 MG PO TABS: 500.0000 mg | ORAL_TABLET | Freq: Two times a day (BID) | ORAL | 0 refills | Status: DC

## 2017-01-25 NOTE — Patient Instructions (Signed)
With your frequent urination/urinary symptoms and urine results, I do think you will likely have an infection when culture results are back. This can be associated with prostatitis and we will get a PSA lab today.  During the interim while we await the studies, I am prescribing Cipro antibiotic. Hydrate well.  We are approaching a long holiday weekend and if you have worsening or changing symptoms then please be evaluated at emergency department. However I don't expect you to have complications.  Follow-up in 7-10 days or as needed.

## 2017-01-25 NOTE — Progress Notes (Signed)
Subjective:    Patient ID: Jeff [name], male    DOB: 1950-09-16, 66 y.o.   MRN: [id number]  HPI   Pt in for feeling like needs to urinate. Urge comes on very quickly. At times hard time to initiate flow. Other times has to strain. Other times will need to run very quickly to get to bathroom. Monday or Tuesday symptoms started. No fever, no chills or sweats.    Review of Systems  Constitutional: Negative for chills, fatigue and fever.  Respiratory: Negative for cough, chest tightness, shortness of breath and wheezing.   Cardiovascular: Negative for chest pain and palpitations.  Gastrointestinal: Negative for abdominal pain, blood in stool, constipation, nausea and vomiting.  Genitourinary: Positive for difficulty urinating, dysuria and frequency. Negative for decreased urine volume, flank pain, hematuria, penile pain, penile swelling, scrotal swelling and testicular pain.  Musculoskeletal: Negative for back pain.  Skin: Negative for rash.  Neurological: Negative for dizziness, speech difficulty, weakness, numbness and headaches.  Hematological: Negative for adenopathy. Does not bruise/bleed easily.  Psychiatric/Behavioral: Negative for confusion, decreased concentration, dysphoric mood, sleep disturbance and suicidal ideas.    Past Medical History:  Diagnosis Date  . Allergic rhinitis   . Atrial fibrillation ([place]) 09/2008   hx of  . Atrial flutter ([place]) 09/2008   hx of  . Cataract    immature but not sure which eye  . Enlarged prostate   . Heart murmur   . Hemorrhoids    hx of  . Hyperlipidemia   . Hypertr obst cardiomyop    takes Amoxicillin prior to dental work and Metoprolol daily  . Insomnia    but no meds required  . Neuropathy   . Parkinsonism ([place])    takes Mirapex tid and Sinemet tid as well  . Peripheral edema    right foot  . Psoriasis    uses Temovate cream as needed     Social History   Social History  . Marital status: Married    Spouse name:  Pamala Hurry  . Number of children: 0  . Years of education: N/A   Occupational History  . [employer] and [employer] professor  .  [place] History Main Topics  . Smoking status: Former Smoker    Types: Cigarettes  . Smokeless tobacco: Never Used     Comment: quit smoking  in the 70s  . Alcohol use 1.8 oz/week    3 Standard drinks or equivalent per week     Comment: beer or vodka a couple of times a week  . Drug use: Yes     Comment: Smokes marijuana once a week  . Sexual activity: Not Currently   Other Topics Concern  . Not on file   Social History Narrative  . No narrative on file    Past Surgical History:  Procedure Laterality Date  . INGUINAL HERNIA REPAIR Bilateral 05/14/2013   Procedure: LAPAROSCOPIC INGUINAL HERNIA REPAIR ;  Surgeon: Harl Bowie, MD;  Location: [place];  Service: General;  Laterality: Bilateral;  . INSERTION OF MESH Bilateral 05/14/2013   Procedure: INSERTION OF MESH;  Surgeon: Harl Bowie, MD;  Location: [place];  Service: General;  Laterality: Bilateral;  . POLYPECTOMY    . TONSILLECTOMY     as a child    Family History  Problem Relation Age of Onset  . Alzheimer's disease Father   . Diabetes Father   .  Heart disease Father        age ?  Marland Kitchen Heart murmur Brother   . Hypertension Neg Hx   . Stroke Neg Hx   . Colon cancer Neg Hx   . Prostate cancer Neg Hx     No Known Allergies  Current Outpatient Prescriptions on File Prior to Visit  Medication Sig Dispense Refill  . aspirin EC 325 MG tablet Take 325 mg by mouth daily.      . carbidopa-levodopa (SINEMET CR) 50-200 MG tablet Take 1 tablet by mouth at bedtime. 90 tablet 3  . carbidopa-levodopa (SINEMET CR) 50-200 MG tablet TAKE 1 TABLET BY MOUTH AT BEDTIME 90 tablet 1  . carbidopa-levodopa (SINEMET IR) 25-100 MG tablet TAKE 2 TABLETS BY MOUTH THREE TIMES DAILY 540 tablet 1  . Cholecalciferol (VITAMIN D3) 2000 units TABS Take 2,000  Units by mouth every other day.    . clobetasol cream (TEMOVATE) 9.83 % Apply 1 application topically 2 (two) times daily as needed (for skin irritation).    Marland Kitchen disopyramide (NORPACE) 150 MG capsule TAKE 1 CAPSULE(150 MG) BY MOUTH TWICE DAILY 180 capsule 1  . gabapentin (NEURONTIN) 300 MG capsule TAKE ONE CAPSULE BY MOUTH EVERY MORNING, 2 CAPSULES AT BEDTIME 270 capsule 1  . Melatonin 3 MG CAPS Take 1 capsule by mouth at bedtime.    . metoprolol succinate (TOPROL-XL) 25 MG 24 hr tablet Take 1 tablet (25 mg total) by mouth daily. 90 tablet 1  . NEUPRO 6 MG/24HR APPLY 1 PATCH EXTERNALLY TO THE SKIN DAILY 30 patch 5  . vitamin B-12 (CYANOCOBALAMIN) 1000 MCG tablet Take 1,000 mcg by mouth daily.     No current facility-administered medications on file prior to visit.     BP 135/77   Pulse (!) 58   Temp 97.7 F (36.5 C) (Oral)   Resp 16   Ht 6\' 3"  (1.905 m)   Wt 217 lb 3.2 oz (98.5 kg)   SpO2 100%   BMI 27.15 kg/m       Objective:   Physical Exam  General Mental Status- Alert. General Appearance- Not in acute distress.   Skin General: Color- Normal Color. Moisture- Normal Moisture.  Neck Carotid Arteries- Normal color. Moisture- Normal Moisture. No carotid bruits. No JVD.  Chest and Lung Exam Auscultation: Breath Sounds:-Normal.  Cardiovascular Auscultation:Rythm- Regular. Murmurs & Other Heart Sounds:Auscultation of the heart reveals- No Murmurs.  Abdomen Inspection:-Inspeection Normal. Palpation/Percussion:Note:No mass. Palpation and Percussion of the abdomen reveal- Non Tender, Non Distended + BS, no rebound or guarding. Faint suprapubic tenderness/pressure. States if I press feels like he needs to urinate.  Neurologic Cranial Nerve exam:- CN III-XII intact(No nystagmus), symmetric smile. Strength:- 5/5 equal and symmetric strength both upper and lower extremities.  Back- no cva tenderness      Assessment & Plan:  With your frequent urination/urinary symptoms  and urine results, I do think you will likely have an infection when culture results are back. This can be associated with prostatitis and we will get a PSA lab today.  During the interim while we await the studies, I am prescribing Cipro antibiotic. Hydrate well.  We are approaching a long holiday weekend and if you have worsening or changing symptoms then please be evaluated at emergency department. However I don't expect you to have complications.  Follow-up in 7-10 days or as needed.  Caliyah Sieh, Percell Miller, PA-C

## 2017-01-27 LAB — URINE CULTURE

## 2017-01-28 ENCOUNTER — Encounter: Payer: Self-pay | Admitting: Medical

## 2017-01-29 ENCOUNTER — Telehealth: Payer: Self-pay | Admitting: Medical

## 2017-01-29 DIAGNOSIS — N400 Enlarged prostate without lower urinary tract symptoms: Secondary | ICD-10-CM

## 2017-01-29 DIAGNOSIS — R39198 Other difficulties with micturition: Secondary | ICD-10-CM

## 2017-01-29 DIAGNOSIS — N390 Urinary tract infection, site not specified: Secondary | ICD-10-CM

## 2017-01-29 MED ORDER — TAMSULOSIN HCL 0.4 MG PO CAPS: 0.4000 mg | ORAL_CAPSULE | Freq: Every day | ORAL | 11 refills | Status: DC

## 2017-01-29 MED ORDER — TAMSULOSIN HCL 0.4 MG PO CAPS: 0.4000 mg | ORAL_CAPSULE | Freq: Every day | ORAL | Status: DC

## 2017-01-29 NOTE — Telephone Encounter (Signed)
Please make sure the prescription of Flomax got to his pharmacy successfully. Initial prescription I tried to send was present only. So you can take that off the printer and shredded it. Notify patient that Flomax was sent to his pharmacy. This may allow him to urinate better. Also see result note. I am referring him to a urologist.

## 2017-01-29 NOTE — Telephone Encounter (Signed)
Referral to urologist placed. I stated I last urology [facility] office. If patient becomes more symptomatic and there is a delay in him being seen locally currently have an see other urologist?

## 2017-01-30 NOTE — Telephone Encounter (Signed)
Patient declined to make appt w/ Urology

## 2017-01-31 NOTE — Telephone Encounter (Signed)
Flomax sent successfully. Pt has declined urology.

## 2017-02-04 ENCOUNTER — Ambulatory Visit (INDEPENDENT_AMBULATORY_CARE_PROVIDER_SITE_OTHER): Payer: Medicare Other | Admitting: Internal Medicine

## 2017-02-04 ENCOUNTER — Encounter: Payer: Self-pay | Admitting: Internal Medicine

## 2017-02-04 VITALS — BP 124/66 | HR 67 | Temp 97.8°F | Resp 14 | Ht 75.0 in | Wt 216.4 lb

## 2017-02-04 DIAGNOSIS — N390 Urinary tract infection, site not specified: Secondary | ICD-10-CM

## 2017-02-04 DIAGNOSIS — Z23 Encounter for immunization: Secondary | ICD-10-CM | POA: Diagnosis not present

## 2017-02-04 NOTE — Progress Notes (Signed)
Subjective:    Patient ID: EINER MEALS, male    DOB: 1951/03/04, 66 y.o.   MRN: [id number]  DOS:  02/04/2017 Type of visit - description : Follow-up Interval history: Was seen recently with urinary symptoms, urine culture showed enterococcus. Hefinished Cipro for 10 days. Now completely asymptomatic. Started Flomax few days after the diagnosis due to L UTS   Review of Systems Currently with no fever chills No dysuria, gross hematuria difficulty urinating  Past Medical History:  Diagnosis Date  . Allergic rhinitis   . Atrial fibrillation ([place]) 09/2008   hx of  . Atrial flutter ([place]) 09/2008   hx of  . Cataract    immature but not sure which eye  . Enlarged prostate   . Heart murmur   . Hemorrhoids    hx of  . Hyperlipidemia   . Hypertr obst cardiomyop    takes Amoxicillin prior to dental work and Metoprolol daily  . Insomnia    but no meds required  . Neuropathy   . Parkinsonism ([place])    takes Mirapex tid and Sinemet tid as well  . Peripheral edema    right foot  . Psoriasis    uses Temovate cream as needed    Past Surgical History:  Procedure Laterality Date  . INGUINAL HERNIA REPAIR Bilateral 05/14/2013   Procedure: LAPAROSCOPIC INGUINAL HERNIA REPAIR ;  Surgeon: Harl Bowie, MD;  Location: [place];  Service: General;  Laterality: Bilateral;  . INSERTION OF MESH Bilateral 05/14/2013   Procedure: INSERTION OF MESH;  Surgeon: Harl Bowie, MD;  Location: [place];  Service: General;  Laterality: Bilateral;  . POLYPECTOMY    . TONSILLECTOMY     as a child    Social History   Social History  . Marital status: Married    Spouse name: Pamala Hurry  . Number of children: 0  . Years of education: N/A   Occupational History  . [employer] and [employer] professor  .  [place] History Main Topics  . Smoking status: Former Smoker    Types: Cigarettes  . Smokeless tobacco: Never Used     Comment: quit  smoking  in the 70s  . Alcohol use 1.8 oz/week    3 Standard drinks or equivalent per week     Comment: beer or vodka a couple of times a week  . Drug use: Yes     Comment: Smokes marijuana once a week  . Sexual activity: Not Currently   Other Topics Concern  . Not on file   Social History Narrative  . No narrative on file      Allergies as of 02/04/2017   No Known Allergies     Medication List       Accurate as of 02/04/17  6:41 PM. Always use your most recent med list.          aspirin EC 325 MG tablet Take 325 mg by mouth daily.   carbidopa-levodopa 25-100 MG tablet Commonly known as:  SINEMET IR TAKE 2 TABLETS BY MOUTH THREE TIMES DAILY   carbidopa-levodopa 50-200 MG tablet Commonly known as:  SINEMET CR TAKE 1 TABLET BY MOUTH AT BEDTIME   clobetasol cream 0.05 % Commonly known as:  TEMOVATE Apply 1 application topically 2 (two) times daily as needed (for skin irritation).   disopyramide 150 MG capsule Commonly known as:  NORPACE TAKE 1 CAPSULE(150 MG) BY MOUTH  TWICE DAILY   gabapentin 300 MG capsule Commonly known as:  NEURONTIN TAKE ONE CAPSULE BY MOUTH EVERY MORNING, 2 CAPSULES AT BEDTIME   Melatonin 3 MG Caps Take 1 capsule by mouth at bedtime.   metoprolol succinate 25 MG 24 hr tablet Commonly known as:  TOPROL-XL Take 1 tablet (25 mg total) by mouth daily.   NEUPRO 6 MG/24HR Generic drug:  rotigotine APPLY 1 PATCH EXTERNALLY TO THE SKIN DAILY   tamsulosin 0.4 MG Caps capsule Commonly known as:  FLOMAX Take 1 capsule (0.4 mg total) by mouth daily.   vitamin B-12 1000 MCG tablet Commonly known as:  CYANOCOBALAMIN Take 1,000 mcg by mouth daily.   Vitamin D3 2000 units Tabs Take 2,000 Units by mouth every other day.            Discharge Care Instructions        Start     Ordered   02/04/17 0000  Pneumococcal conjugate vaccine 13-valent     02/04/17 0905         Objective:   Physical Exam BP 124/66 (BP Location: Left Arm,  Patient Position: Sitting, Cuff Size: Small)   Pulse 67   Temp 97.8 F (36.6 C) (Oral)   Resp 14   Ht 6\' 3"  (1.905 m)   Wt 216 lb 6 oz (98.1 kg)   SpO2 97%   BMI 27.05 kg/m  General:   Well developed, well nourished . NAD.  HEENT:  Normocephalic . Face symmetric, atraumatic  Abdomen:  Not distended, soft, non-tender. No rebound or rigidity.  No CVA tenderness Skin: Not pale. Not jaundice Neurologic:  alert & oriented X3.  Speech normal, gait appropriate for age and unassisted Psych--  Cognition and judgment appear intact.  Cooperative with normal attention span and concentration.  Behavior appropriate. No anxious or depressed appearing.     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 Had a UTI 2014 but otherwise no urological history. Recommend to continue Flomax for a couple of weeks then stop. Call if symptoms resurface. Primary care: Prevnar today RTC few weeks, CPX

## 2017-02-04 NOTE — Progress Notes (Signed)
Pre visit review using our clinic review tool, if applicable. No additional management support is needed unless otherwise documented below in the visit note. 

## 2017-02-04 NOTE — Patient Instructions (Addendum)
Stop tamsulosin in 2 weeks  See you in few weeks

## 2017-02-04 NOTE — Assessment & Plan Note (Signed)
UTI: 66 year old male with a enterococcus UTI, s/p Cipro. Had a UTI 2014 but otherwise no urological history. Recommend to continue Flomax for a couple of weeks then stop. Call if symptoms resurface. Primary care: Prevnar today RTC few weeks, CPX

## 2017-03-13 ENCOUNTER — Encounter: Payer: Self-pay | Admitting: Cardiology

## 2017-03-18 NOTE — Progress Notes (Signed)
HPI: FU hypertrophic obstructive cardiomyopathy and atrial fibrillation. Renal Dopplers in September 2014 normal. Patient also apparently found to have atrial fibrillation on previous monitor. Declined anticoagulation. Exercise treadmill October 2017 showed a normal blood pressure response. Echocardiogram October 2017 showed normal LV function, severe left ventricular hypertrophy, mild chordal SAM with no gradient. Moderate aortic insufficiency. Mild mitral regurgitation and severe left atrial enlargement. Holter monitor October 2017 showed sinus rhythm with PACs, PVCs, couplets and one 3 beat run of nonsustained ventricular tachycardia. Abdominal ultrasound December 2017 showed 2.4 x 2.8 cm abdominal aortic aneurysm. Since he was last seen, the patient denies any dyspnea on exertion, orthopnea, PND, pedal edema, palpitations, syncope or chest pain.   Current Outpatient Prescriptions  Medication Sig Dispense Refill  . aspirin EC 325 MG tablet Take 325 mg by mouth daily.      . carbidopa-levodopa (SINEMET CR) 50-200 MG tablet TAKE 1 TABLET BY MOUTH AT BEDTIME 90 tablet 1  . carbidopa-levodopa (SINEMET IR) 25-100 MG tablet TAKE 2 TABLETS BY MOUTH THREE TIMES DAILY 540 tablet 1  . Cholecalciferol (VITAMIN D3) 2000 units TABS Take 2,000 Units by mouth every other day.    . clobetasol cream (TEMOVATE) 7.84 % Apply 1 application topically 2 (two) times daily as needed (for skin irritation). 60 g 5  . disopyramide (NORPACE) 150 MG capsule TAKE 1 CAPSULE(150 MG) BY MOUTH TWICE DAILY 180 capsule 0  . gabapentin (NEURONTIN) 300 MG capsule TAKE ONE CAPSULE BY MOUTH EVERY MORNING, 2 CAPSULES AT BEDTIME 270 capsule 1  . Melatonin 3 MG CAPS Take 1 capsule by mouth at bedtime.    . metoprolol succinate (TOPROL-XL) 25 MG 24 hr tablet Take 1 tablet (25 mg total) by mouth daily. 90 tablet 1  . NEUPRO 6 MG/24HR APPLY 1 PATCH EXTERNALLY TO THE SKIN DAILY 30 patch 5  . vitamin B-12 (CYANOCOBALAMIN) 1000 MCG  tablet Take 1,000 mcg by mouth daily.     No current facility-administered medications for this visit.      Past Medical History:  Diagnosis Date  . Allergic rhinitis   . Atrial fibrillation ([place]) 09/2008   hx of  . Atrial flutter ([place]) 09/2008   hx of  . Cataract    immature but not sure which eye  . Enlarged prostate   . Heart murmur   . Hemorrhoids    hx of  . Hyperlipidemia   . Hypertr obst cardiomyop    takes Amoxicillin prior to dental work and Metoprolol daily  . Insomnia    but no meds required  . Neuropathy   . Parkinsonism ([place])    takes Mirapex tid and Sinemet tid as well  . Peripheral edema    right foot  . Psoriasis    uses Temovate cream as needed    Past Surgical History:  Procedure Laterality Date  . INGUINAL HERNIA REPAIR Bilateral 05/14/2013   Procedure: LAPAROSCOPIC INGUINAL HERNIA REPAIR ;  Surgeon: Harl Bowie, MD;  Location: [place];  Service: General;  Laterality: Bilateral;  . INSERTION OF MESH Bilateral 05/14/2013   Procedure: INSERTION OF MESH;  Surgeon: Harl Bowie, MD;  Location: [place];  Service: General;  Laterality: Bilateral;  . POLYPECTOMY    . TONSILLECTOMY     as a child    Social History   Social History  . Marital status: Married    Spouse name: Pamala Hurry  . Number of children: 0  . 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  [place] History Main Topics  . Smoking status: Former Smoker    Types: Cigarettes  . Smokeless tobacco: Never Used     Comment: quit smoking  in the 70s  . Alcohol use 1.8 oz/week    3 Standard drinks or equivalent per week     Comment: beer or vodka a couple of times a week  . Drug use: Yes     Comment: Smokes marijuana once a week  . Sexual activity: Not Currently   Other Topics Concern  . Not on file   Social History Narrative  . No narrative on file    Family History  Problem Relation Age of Onset    . Alzheimer's disease Father   . Diabetes Father   . Heart disease Father        age ?  Marland Kitchen Heart murmur Brother   . Hypertension Neg Hx   . Stroke Neg Hx   . Colon cancer Neg Hx   . Prostate cancer Neg Hx     ROS: no fevers or chills, productive cough, hemoptysis, dysphasia, odynophagia, melena, hematochezia, dysuria, hematuria, rash, seizure activity, orthopnea, PND, pedal edema, claudication. Remaining systems are negative.  Physical Exam: Well-developed well-nourished in no acute distress.  Skin is warm and dry.  HEENT is normal.  Neck is supple.  Chest is clear to auscultation with normal expansion.  Cardiovascular exam is regular rate and rhythm.  Abdominal exam nontender or distended. No masses palpated. Extremities show no edema. neuro grossly intact  ECG- Sinus rhythm with PVCs. First-degree AV block. Left bundle branch block. personally reviewed  A/P  1 Hypertrophic obstructive cardiomyopathy-patient continues to do well from a symptomatic standpoint. Continue present dose of beta blocker. He has no history of syncope and no family history of sudden death. His blood pressure increased normally with exercise. He did have 3 beats of nonsustained ventricular tachycardia on his Holter monitor. We will repeat echocardiogram to evaluate wall thickness. In the absence of other risk factors I will not pursue ICD for isolated 3 beats nonsustained ventricular tachycardia.  2 abdominal aortic aneurysm-plan follow-up ultrasound in December 2019.  3 history of atrial fibrillation-noted on remote monitor. Patient remains in sinus rhythm and has declined anticoagulation in the past. He understands the risk of CVA. Continue disopyramide.  4 moderate aortic insufficiency-repeat echocardiogram.   Kirk Ruths, MD

## 2017-03-20 ENCOUNTER — Other Ambulatory Visit: Payer: Self-pay | Admitting: *Deleted

## 2017-03-20 DIAGNOSIS — I714 Abdominal aortic aneurysm, without rupture, unspecified: Secondary | ICD-10-CM

## 2017-03-22 ENCOUNTER — Ambulatory Visit (INDEPENDENT_AMBULATORY_CARE_PROVIDER_SITE_OTHER): Payer: Medicare Other | Admitting: Internal Medicine

## 2017-03-22 ENCOUNTER — Encounter: Payer: Self-pay | Admitting: Internal Medicine

## 2017-03-22 VITALS — BP 124/72 | HR 56 | Temp 98.1°F | Resp 14 | Ht 75.0 in | Wt 211.5 lb

## 2017-03-22 DIAGNOSIS — Z0000 Encounter for general adult medical examination without abnormal findings: Secondary | ICD-10-CM

## 2017-03-22 DIAGNOSIS — Z23 Encounter for immunization: Secondary | ICD-10-CM

## 2017-03-22 MED ORDER — METOPROLOL SUCCINATE ER 25 MG PO TB24: 25.0000 mg | ORAL_TABLET | Freq: Every day | ORAL | 1 refills | Status: DC

## 2017-03-22 MED ORDER — CLOBETASOL PROPIONATE 0.05 % EX CREA: 1.0000 "application " | TOPICAL_CREAM | Freq: Two times a day (BID) | CUTANEOUS | 5 refills | Status: AC | PRN

## 2017-03-22 NOTE — Patient Instructions (Addendum)
   GO TO THE FRONT DESK Schedule labs to be done fasting next week  Schedule your next appointment for a routine checkup in 6 months  Will also need to recheck your PSA 06-2017, please call the office at the time  Consider  [facility]

## 2017-03-22 NOTE — Progress Notes (Signed)
Pre visit review using our clinic review tool, if applicable. No additional management support is needed unless otherwise documented below in the visit note. 

## 2017-03-22 NOTE — Progress Notes (Signed)
Subjective:    Patient ID: Jeff [name], male    DOB: 26-Jun-1950, 66 y.o.   MRN: [id number]  DOS:  03/22/2017 Type of visit - description : cpx Interval history: In general feeling well, he does have some concerns   Review of Systems Occasional urinary urgency, mild, no other associated symptoms. Occasional backache, right side lower back, symptoms usually worse if he sits for too long, no radiation.  He is able to enjoy at 3-mile walk 5 times a week.  No fever or chills, no unexplained weight loss. Sleeping pattern has been poor but they recently Parkinson medication was changed -per pt- and sleeps better. Has also noted that he is dragging his R foot sometimes.  Other than above, a 14 point review of systems is negative    Past Medical History:  Diagnosis Date  . Allergic rhinitis   . Atrial fibrillation ([place]) 09/2008   hx of  . Atrial flutter ([place]) 09/2008   hx of  . Cataract    immature but not sure which eye  . Enlarged prostate   . Heart murmur   . Hemorrhoids    hx of  . Hyperlipidemia   . Hypertr obst cardiomyop    takes Amoxicillin prior to dental work and Metoprolol daily  . Insomnia    but no meds required  . Neuropathy   . Parkinsonism ([place])    takes Mirapex tid and Sinemet tid as well  . Peripheral edema    right foot  . Psoriasis    uses Temovate cream as needed    Past Surgical History:  Procedure Laterality Date  . INGUINAL HERNIA REPAIR Bilateral 05/14/2013   Procedure: LAPAROSCOPIC INGUINAL HERNIA REPAIR ;  Surgeon: Harl Bowie, MD;  Location: [facility];  Service: General;  Laterality: Bilateral;  . INSERTION OF MESH Bilateral 05/14/2013   Procedure: INSERTION OF MESH;  Surgeon: Harl Bowie, MD;  Location: [facility];  Service: General;  Laterality: Bilateral;  . POLYPECTOMY    . TONSILLECTOMY     as a child    Social History   Social History  . Marital status: Married    Spouse name: Pamala Hurry  . Number of children: 0  . Years of  education: N/A   Occupational History  . [employer] professor  .  [place] History Main Topics  . Smoking status: Former Smoker    Types: Cigarettes  . Smokeless tobacco: Never Used     Comment: quit smoking  in the 70s  . Alcohol use 1.8 oz/week    3 Standard drinks or equivalent per week     Comment: beer or vodka a couple of times a week  . Drug use: Yes     Comment: Smokes marijuana once a week  . Sexual activity: Not Currently   Other Topics Concern  . Not on file   Social History Narrative  . No narrative on file     Family History  Problem Relation Age of Onset  . Alzheimer's disease Father   . Diabetes Father   . Heart disease Father        age ?  Marland Kitchen Heart murmur Brother   . Hypertension Neg Hx   . Stroke Neg Hx   . Colon cancer Neg Hx   . Prostate cancer Neg Hx      Allergies as of 03/22/2017   No Known Allergies  Medication List       Accurate as of 03/22/17 11:59 PM. Always use your most recent med list.          aspirin EC 325 MG tablet Take 325 mg by mouth daily.   carbidopa-levodopa 25-100 MG tablet Commonly known as:  SINEMET IR TAKE 2 TABLETS BY MOUTH THREE TIMES DAILY   carbidopa-levodopa 50-200 MG tablet Commonly known as:  SINEMET CR TAKE 1 TABLET BY MOUTH AT BEDTIME   clobetasol cream 0.05 % Commonly known as:  TEMOVATE Apply 1 application topically 2 (two) times daily as needed (for skin irritation).   disopyramide 150 MG capsule Commonly known as:  NORPACE TAKE 1 CAPSULE(150 MG) BY MOUTH TWICE DAILY   gabapentin 300 MG capsule Commonly known as:  NEURONTIN TAKE ONE CAPSULE BY MOUTH EVERY MORNING, 2 CAPSULES AT BEDTIME   Melatonin 3 MG Caps Take 1 capsule by mouth at bedtime.   metoprolol succinate 25 MG 24 hr tablet Commonly known as:  TOPROL-XL Take 1 tablet (25 mg total) by mouth daily.   NEUPRO 6 MG/24HR Generic drug:  rotigotine APPLY 1 PATCH EXTERNALLY TO THE SKIN  DAILY   vitamin B-12 1000 MCG tablet Commonly known as:  CYANOCOBALAMIN Take 1,000 mcg by mouth daily.   Vitamin D3 2000 units Tabs Take 2,000 Units by mouth every other day.          Objective:   Physical Exam BP 124/72 (BP Location: Left Arm, Patient Position: Sitting, Cuff Size: Small)   Pulse (!) 56   Temp 98.1 F (36.7 C) (Oral)   Resp 14   Ht 6\' 3"  (1.905 m)   Wt 211 lb 8 oz (95.9 kg)   SpO2 97%   BMI 26.44 kg/m   General:   Well developed, well nourished . NAD.  Neck: No  thyromegaly  HEENT:  Normocephalic . Face symmetric, atraumatic Lungs:  CTA B Normal respiratory effort, no intercostal retractions, no accessory muscle use. Heart: RRR,  no murmur.  No pretibial edema bilaterally  Abdomen:  Not distended, soft, non-tender. No rebound or rigidity.   Skin: Exposed areas without rash. Not pale. Not jaundice Neurologic:  alert & oriented X3.  Speech normal, gait appropriate for age and unassisted Psych: Cognition and judgment appear intact.  Cooperative with normal attention span and concentration.  Behavior appropriate. No anxious or depressed appearing.    Assessment & Plan:   Assessment  Hyperlipidemia CV: --H/O Atrial fibrillation-flutter 2010 --Hypertrophic obstructive cardiomyopathy  (Dr Stanford Breed) --03-2015: Ectatic mid aorta with infrarenal AAA measuring 2.2 cm x 2.8 cm,Last ultrasound 04/2016, one year Neuro: -Parkinsonism (Dr Tat) -Neuropathy Psoriasis BPH B12 deficiency   drematitis-- behind ears, topical steroids prn  PLAN: Hyperlipidemia: Diet controlled, checking labs CV: Seems stable, on Toprol, Norpace. Parkinson, neuropathy: Follow-up by neurology, reports he sometimes strike his right foot, recommend to discuss with neurology, at some point may need a brace to prevent falls. Back pain: No worrisome features, rec stretching, heating pad as needed. Mild BPH Self d/c Flomax, sxs not severe, observation RTC 6 months but will check  a PSA 06-2017

## 2017-03-22 NOTE — Assessment & Plan Note (Addendum)
-  Td  2014 ; pnm 23: 12-2015; prevnar : 2018; Shingles shot : 02-2011; shingrix discussed. Flu shot today -[facility]: Colonoscopy:   11-2006, adenomatous polyp, cscope again 2014, next 5 years -Prostate Ca screening: DRE wnl 08-2016, PSA has been checked twice due to a UTI, last one was slt elevated 12/2016,  will recheck by 06-2017 ( 6 months later). -Labs: Will come back fasting for CMP, FLP, CBC, A1c. -Diet and exercise he is doing great with taking regular walks.

## 2017-03-24 NOTE — Assessment & Plan Note (Signed)
Hyperlipidemia: Diet controlled, checking labs CV: Seems stable, on Toprol, Norpace. Parkinson, neuropathy: Follow-up by neurology, reports he sometimes strike his right foot, recommend to discuss with neurology, at some point may need a brace to prevent falls. Back pain: No worrisome features, rec stretching, heating pad as needed. Mild BPH Self d/c Flomax, sxs not severe, observation RTC 6 months but will check a PSA 06-2017

## 2017-03-25 ENCOUNTER — Encounter: Payer: Self-pay | Admitting: Internal Medicine

## 2017-03-25 ENCOUNTER — Other Ambulatory Visit (INDEPENDENT_AMBULATORY_CARE_PROVIDER_SITE_OTHER): Payer: Medicare Other

## 2017-03-25 DIAGNOSIS — E785 Hyperlipidemia, unspecified: Secondary | ICD-10-CM

## 2017-03-25 DIAGNOSIS — I480 Paroxysmal atrial fibrillation: Secondary | ICD-10-CM

## 2017-03-25 LAB — CBC WITH DIFFERENTIAL/PLATELET
BASOS ABS: 0 10*3/uL (ref 0.0–0.1)
BASOS PCT: 0.6 % (ref 0.0–3.0)
EOS ABS: 0.2 10*3/uL (ref 0.0–0.7)
Eosinophils Relative: 4.5 % (ref 0.0–5.0)
HEMATOCRIT: 45.1 % (ref 39.0–52.0)
Hemoglobin: 15 g/dL (ref 13.0–17.0)
LYMPHS ABS: 1.2 10*3/uL (ref 0.7–4.0)
LYMPHS PCT: 26 % (ref 12.0–46.0)
MCHC: 33.2 g/dL (ref 30.0–36.0)
MCV: 90.7 fl (ref 78.0–100.0)
Monocytes Absolute: 0.4 10*3/uL (ref 0.1–1.0)
Monocytes Relative: 9.7 % (ref 3.0–12.0)
NEUTROS ABS: 2.7 10*3/uL (ref 1.4–7.7)
NEUTROS PCT: 59.2 % (ref 43.0–77.0)
PLATELETS: 134 10*3/uL — AB (ref 150.0–400.0)
RBC: 4.97 Mil/uL (ref 4.22–5.81)
RDW: 13.4 % (ref 11.5–15.5)
WBC: 4.6 10*3/uL (ref 4.0–10.5)

## 2017-03-25 LAB — COMPREHENSIVE METABOLIC PANEL
ALT: 5 U/L (ref 0–53)
AST: 19 U/L (ref 0–37)
Albumin: 4 g/dL (ref 3.5–5.2)
Alkaline Phosphatase: 56 U/L (ref 39–117)
BUN: 18 mg/dL (ref 6–23)
CALCIUM: 9.5 mg/dL (ref 8.4–10.5)
CHLORIDE: 105 meq/L (ref 96–112)
CO2: 30 meq/L (ref 19–32)
Creatinine, Ser: 0.97 mg/dL (ref 0.40–1.50)
GFR: 82.03 mL/min (ref >60.00)
Glucose, Bld: 101 mg/dL — ABNORMAL HIGH (ref 70–99)
POTASSIUM: 3.9 meq/L (ref 3.5–5.1)
Sodium: 141 mEq/L (ref 135–145)
Total Bilirubin: 0.6 mg/dL (ref 0.2–1.2)
Total Protein: 6.3 g/dL (ref 6.0–8.3)

## 2017-03-25 LAB — LIPID PANEL
CHOLESTEROL: 166 mg/dL (ref 0–200)
HDL: 53 mg/dL (ref >39.00)
LDL Cholesterol: 95 mg/dL (ref 0–99)
NONHDL: 113.18
TRIGLYCERIDES: 90 mg/dL (ref 0.0–149.0)
Total CHOL/HDL Ratio: 3
VLDL: 18 mg/dL (ref 0.0–40.0)

## 2017-03-25 LAB — HEMOGLOBIN A1C: HEMOGLOBIN A1C: 5.6 % (ref 4.6–6.5)

## 2017-03-27 ENCOUNTER — Ambulatory Visit (INDEPENDENT_AMBULATORY_CARE_PROVIDER_SITE_OTHER): Payer: Medicare Other | Admitting: Cardiology

## 2017-03-27 ENCOUNTER — Encounter: Payer: Self-pay | Admitting: Cardiology

## 2017-03-27 VITALS — BP 126/84 | HR 68 | Ht 75.0 in | Wt 212.0 lb

## 2017-03-27 DIAGNOSIS — I714 Abdominal aortic aneurysm, without rupture, unspecified: Secondary | ICD-10-CM

## 2017-03-27 DIAGNOSIS — I421 Obstructive hypertrophic cardiomyopathy: Secondary | ICD-10-CM | POA: Diagnosis not present

## 2017-03-27 DIAGNOSIS — I480 Paroxysmal atrial fibrillation: Secondary | ICD-10-CM | POA: Diagnosis not present

## 2017-03-27 NOTE — Patient Instructions (Signed)

## 2017-03-29 ENCOUNTER — Telehealth: Payer: Self-pay | Admitting: *Deleted

## 2017-03-29 ENCOUNTER — Other Ambulatory Visit: Payer: Self-pay

## 2017-03-29 ENCOUNTER — Ambulatory Visit (HOSPITAL_COMMUNITY): Payer: Medicare Other | Attending: Cardiology

## 2017-03-29 DIAGNOSIS — R011 Cardiac murmur, unspecified: Secondary | ICD-10-CM | POA: Insufficient documentation

## 2017-03-29 DIAGNOSIS — I080 Rheumatic disorders of both mitral and aortic valves: Secondary | ICD-10-CM | POA: Insufficient documentation

## 2017-03-29 DIAGNOSIS — G629 Polyneuropathy, unspecified: Secondary | ICD-10-CM | POA: Insufficient documentation

## 2017-03-29 DIAGNOSIS — G20 Parkinson's disease: Secondary | ICD-10-CM | POA: Insufficient documentation

## 2017-03-29 DIAGNOSIS — I4891 Unspecified atrial fibrillation: Secondary | ICD-10-CM | POA: Diagnosis not present

## 2017-03-29 DIAGNOSIS — I77810 Thoracic aortic ectasia: Secondary | ICD-10-CM | POA: Insufficient documentation

## 2017-03-29 DIAGNOSIS — I421 Obstructive hypertrophic cardiomyopathy: Secondary | ICD-10-CM | POA: Diagnosis present

## 2017-03-29 DIAGNOSIS — R600 Localized edema: Secondary | ICD-10-CM | POA: Diagnosis not present

## 2017-03-29 DIAGNOSIS — I4892 Unspecified atrial flutter: Secondary | ICD-10-CM | POA: Diagnosis not present

## 2017-03-29 NOTE — Telephone Encounter (Addendum)
-----   Message from Lelon Perla, MD sent at 03/29/2017  9:14 AM EDT ----- Arrange cardiac MRI [place] with pt, aware of echo results. Order placed and sent to admin to schedule and sent to pre-cert.

## 2017-04-02 ENCOUNTER — Encounter: Payer: Self-pay | Admitting: Cardiology

## 2017-04-17 ENCOUNTER — Ambulatory Visit (HOSPITAL_COMMUNITY)
Admission: RE | Admit: 2017-04-17 | Discharge: 2017-04-17 | Disposition: A | Discharge status: Home / Self Care | Payer: Medicare Other | Source: Ambulatory Visit | Attending: Cardiology | Admitting: Cardiology

## 2017-04-17 DIAGNOSIS — I080 Rheumatic disorders of both mitral and aortic valves: Secondary | ICD-10-CM | POA: Diagnosis not present

## 2017-04-17 DIAGNOSIS — I421 Obstructive hypertrophic cardiomyopathy: Secondary | ICD-10-CM | POA: Insufficient documentation

## 2017-04-17 DIAGNOSIS — I422 Other hypertrophic cardiomyopathy: Secondary | ICD-10-CM | POA: Diagnosis not present

## 2017-04-17 MED ORDER — GADOBENATE DIMEGLUMINE 529 MG/ML IV SOLN
30.0000 mL | Freq: Once | INTRAVENOUS | Status: AC
  Administered 2017-04-17: 30 mL via INTRAVENOUS

## 2017-04-29 ENCOUNTER — Other Ambulatory Visit: Payer: Self-pay | Admitting: Cardiology
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
## 2017-05-01 ENCOUNTER — Ambulatory Visit: Payer: Medicare Other | Admitting: Neurology

## 2017-05-01 ENCOUNTER — Encounter: Payer: Self-pay | Admitting: Neurology

## 2017-05-01 VITALS — BP 128/70 | HR 64 | Ht 77.0 in | Wt 216.0 lb

## 2017-05-01 DIAGNOSIS — G20 Parkinson's disease: Secondary | ICD-10-CM | POA: Diagnosis not present

## 2017-05-01 DIAGNOSIS — G609 Hereditary and idiopathic neuropathy, unspecified: Secondary | ICD-10-CM | POA: Diagnosis not present

## 2017-05-01 MED ORDER — CARBIDOPA-LEVODOPA 25-100 MG PO TABS: 2.0000 | ORAL_TABLET | Freq: Three times a day (TID) | ORAL | 1 refills | Status: DC

## 2017-05-01 MED ORDER — GABAPENTIN 300 MG PO CAPS: ORAL_CAPSULE | ORAL | 1 refills | Status: AC

## 2017-05-01 MED ORDER — CARBIDOPA-LEVODOPA ER 50-200 MG PO TBCR: 1.0000 | EXTENDED_RELEASE_TABLET | Freq: Every day | ORAL | 1 refills | Status: DC

## 2017-05-01 MED ORDER — FLUTICASONE PROPIONATE 50 MCG/ACT NA SUSP: NASAL | 2 refills | Status: DC

## 2017-05-10 ENCOUNTER — Telehealth: Payer: Self-pay | Admitting: Cardiology

## 2017-05-10 NOTE — Telephone Encounter (Signed)
Returned the call to the patient. He stated that he was called to schedule a AAA doppler. He would like to wait. Per Dr. Stanford Breed he is good to have a repeat AAA doppler in December 2019. The patient has verbalized his understanding.

## 2017-05-10 NOTE — Telephone Encounter (Signed)
New message    Pt is returning call about abdominal aneurysm

## 2017-05-13 ENCOUNTER — Other Ambulatory Visit: Payer: Self-pay | Admitting: Neurology

## 2017-05-15 ENCOUNTER — Telehealth: Payer: Self-pay | Admitting: Cardiology

## 2017-05-15 NOTE — Telephone Encounter (Signed)
Will forward for dr crenshaw review  

## 2017-05-15 NOTE — Telephone Encounter (Signed)
Mr. Ritchie is calling because he received a letter from [facility] that one of his medication (Disopyramide) is not on the drug list for 2019 , but this is a alternative drug (Amiodarone) is on the 2019 drug list .  Mr. Piacentini is wanting to know what will Dr. Stanford Breed. Please call

## 2017-05-16 NOTE — Telephone Encounter (Signed)
Spoke with pt, Aware of dr Jacalyn Lefevre recommendations. Follow up abdominal US scheduled to f/u AAA.

## 2017-05-16 NOTE — Telephone Encounter (Signed)
Given h/o HOCM, disopyramide is better choice than amiodarone Jeff [name]

## 2017-05-17 ENCOUNTER — Other Ambulatory Visit: Payer: Self-pay | Admitting: Internal Medicine

## 2017-05-31 ENCOUNTER — Ambulatory Visit (HOSPITAL_COMMUNITY)
Admission: RE | Admit: 2017-05-31 | Discharge: 2017-05-31 | Disposition: A | Discharge status: Home / Self Care | Payer: Medicare Other | Source: Ambulatory Visit | Attending: Cardiovascular Disease | Admitting: Cardiovascular Disease

## 2017-05-31 ENCOUNTER — Other Ambulatory Visit: Payer: Self-pay | Admitting: Neurology

## 2017-05-31 DIAGNOSIS — I714 Abdominal aortic aneurysm, without rupture, unspecified: Secondary | ICD-10-CM

## 2017-06-11 ENCOUNTER — Other Ambulatory Visit: Payer: Self-pay | Admitting: *Deleted

## 2017-06-11 DIAGNOSIS — I714 Abdominal aortic aneurysm, without rupture, unspecified: Secondary | ICD-10-CM

## 2017-06-24 ENCOUNTER — Encounter: Payer: Self-pay | Admitting: Neurology

## 2017-07-05 ENCOUNTER — Ambulatory Visit: Payer: Medicare Other | Admitting: Internal Medicine

## 2017-07-05 VITALS — BP 154/78 | HR 60 | Temp 98.3°F | Ht 75.0 in | Wt 216.4 lb

## 2017-07-05 DIAGNOSIS — R972 Elevated prostate specific antigen [PSA]: Secondary | ICD-10-CM

## 2017-07-05 DIAGNOSIS — J069 Acute upper respiratory infection, unspecified: Secondary | ICD-10-CM

## 2017-07-05 LAB — PSA: PSA: 1.81 ng/mL (ref 0.10–4.00)

## 2017-07-05 MED ORDER — AMOXICILLIN 875 MG PO TABS: 875.0000 mg | ORAL_TABLET | Freq: Two times a day (BID) | ORAL | 0 refills | Status: DC

## 2017-07-05 NOTE — Progress Notes (Signed)
Subjective:    Patient ID: Jeff [name], male    DOB: 1950/10/04, 67 y.o.   MRN: [id number]  DOS:  07/05/2017 Type of visit - description : Acute visit Interval history: Symptoms started yesterday with cough, greenish sputum production. Wife is also sick.    BP Readings from Last 3 Encounters:  07/05/17 (!) 154/78  05/01/17 128/70  03/27/17 126/84     Review of Systems Denies fever, chills. No sinus pain or congestion, he does have a small amount of nasal discharge. Denies nausea, vomiting. With no myalgias. All the "congestion" seems to be in the throat.   Past Medical History:  Diagnosis Date  . Allergic rhinitis   . Atrial fibrillation ([place]) 09/2008   hx of  . Atrial flutter ([place]) 09/2008   hx of  . Cataract    immature but not sure which eye  . Enlarged prostate   . Heart murmur   . Hemorrhoids    hx of  . Hyperlipidemia   . Hypertr obst cardiomyop    takes Amoxicillin prior to dental work and Metoprolol daily  . Insomnia    but no meds required  . Neuropathy   . Parkinsonism ([place])    takes Mirapex tid and Sinemet tid as well  . Peripheral edema    right foot  . Psoriasis    uses Temovate cream as needed    Past Surgical History:  Procedure Laterality Date  . INGUINAL HERNIA REPAIR Bilateral 05/14/2013   Procedure: LAPAROSCOPIC INGUINAL HERNIA REPAIR ;  Surgeon: Harl Bowie, MD;  Location: [facility];  Service: General;  Laterality: Bilateral;  . INSERTION OF MESH Bilateral 05/14/2013   Procedure: INSERTION OF MESH;  Surgeon: Harl Bowie, MD;  Location: [facility];  Service: General;  Laterality: Bilateral;  . POLYPECTOMY    . TONSILLECTOMY     as a child    Social History   Socioeconomic History  . Marital status: Married    Spouse name: Pamala Hurry  . Number of children: 0  . Years of education: Not on file  . Highest education level: Not on file  Social Needs  . Financial resource strain: Not on file  . Food insecurity - worry: Not on  file  . Food insecurity - inability: Not on file  . Transportation needs - medical: Not on file  . Transportation needs - non-medical: Not on file  Occupational History  . Occupation: [employer]      Comment: Administrator, sports: [employer]  Tobacco Use  . Smoking status: Former Smoker    Types: Cigarettes  . Smokeless tobacco: Never Used  . Tobacco comment: quit smoking  in the 70s  Substance and Sexual Activity  . Alcohol use: Yes    Alcohol/week: 1.8 oz    Types: 3 Standard drinks or equivalent per week    Comment: beer or vodka a couple of times a week  . Drug use: Yes    Comment: Smokes marijuana once a week  . Sexual activity: Not Currently  Other Topics Concern  . Not on file  Social History Narrative  . Not on file      Allergies as of 07/05/2017   No Known Allergies     Medication List        Accurate as of 07/05/17 11:59 PM. Always use your most recent med list.          amoxicillin 875 MG tablet Commonly known  as:  AMOXIL Take 1 tablet (875 mg total) by mouth 2 (two) times daily.   aspirin EC 325 MG tablet Take 325 mg by mouth daily.   carbidopa-levodopa 25-100 MG tablet Commonly known as:  SINEMET IR Take 2 tablets by mouth 3 (three) times daily.   carbidopa-levodopa 50-200 MG tablet Commonly known as:  SINEMET CR Take 1 tablet by mouth at bedtime.   clobetasol cream 0.05 % Commonly known as:  TEMOVATE Apply 1 application topically 2 (two) times daily as needed (for skin irritation).   disopyramide 150 MG capsule Commonly known as:  NORPACE TAKE ONE CAPSULE BY MOUTH TWICE DAILY   fluticasone 50 MCG/ACT nasal spray Commonly known as:  FLONASE As directed   gabapentin 300 MG capsule Commonly known as:  NEURONTIN TAKE ONE CAPSULE BY MOUTH EVERY MORNING, 2 CAPSULES AT BEDTIME   Melatonin 3 MG Caps Take 1 capsule by mouth at bedtime.   metoprolol succinate 25 MG 24 hr tablet Commonly known as:  TOPROL-XL Take 1  tablet (25 mg total) by mouth daily.   NEUPRO 6 MG/24HR Generic drug:  rotigotine APPLY 1 PATCH EXTERNALLY TO THE SKIN DAILY   vitamin B-12 1000 MCG tablet Commonly known as:  CYANOCOBALAMIN Take 1,000 mcg by mouth daily.   Vitamin D3 2000 units Tabs Take 2,000 Units by mouth every other day.          Objective:   Physical Exam BP (!) 154/78   Pulse 60   Temp 98.3 F (36.8 C) (Oral)   Ht 6\' 3"  (1.905 m)   Wt 216 lb 6.4 oz (98.2 kg)   SpO2 100%   BMI 27.05 kg/m  General:   Well developed, well nourished.  Cough noted during the visit, no distress HEENT:  Normocephalic . Face symmetric, atraumatic. TMs normal, throat symmetric, normal rate. Nose congested, sinuses non-TTP.   Lungs:  CTA B Normal respiratory effort, no intercostal retractions, no accessory muscle use. Heart: RRR,  no murmur.  No pretibial edema bilaterally  Skin: Not pale. Not jaundice Neurologic:  alert & oriented X3.  Speech normal, gait appropriate for age and unassisted Psych--  Cognition and judgment appear intact.  Cooperative with normal attention span and concentration.  Behavior appropriate. No anxious or depressed appearing.      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  See AVS PSA velocity increased?: Check a PSA BP slightly elevated today: BP is usually okay, will recommend to check the next few days and call if it is persistently elevated.  Avoid decongestants RTC ~ 08-2017

## 2017-07-05 NOTE — Patient Instructions (Signed)
GO TO THE LAB : Get the blood work    Next visit by April 2019   Check the  blood pressure 2 or 3 times a   week  X the next 2 weeks  Be sure your blood pressure is between 110/65 and  135/85. If it is consistently higher or lower, let me know  =====  Rest, fluids , tylenol  For cough:  Take Mucinex DM twice a day as needed until better  If nasal congestion: Use OTC Nasocort or Flonase : 2 nasal sprays on each side of the nose in the morning until you feel better   Avoid decongestants such as  Pseudoephedrine or phenylephrine      Take the antibiotic as prescribed  (Amoxicillin) only if no better in 5-6 days   Call if not gradually better over the next  10 days  Call anytime if the symptoms are severe

## 2017-07-06 NOTE — Assessment & Plan Note (Signed)
URI: No influenza on clinical grounds, rx supportive care, start amoxicillin if not gradually better in the next few days.  See AVS PSA velocity increased?: Check a PSA BP slightly elevated today: BP is usually okay, will recommend to check the next few days and call if it is persistently elevated.  Avoid decongestants RTC ~ 08-2017

## 2017-07-18 ENCOUNTER — Other Ambulatory Visit: Payer: Self-pay | Admitting: Neurology

## 2017-07-30 ENCOUNTER — Telehealth: Payer: Self-pay | Admitting: Neurology

## 2017-07-30 MED ORDER — ROTIGOTINE 6 MG/24HR TD PT24: MEDICATED_PATCH | TRANSDERMAL | 0 refills | Status: AC

## 2017-07-30 NOTE — Telephone Encounter (Signed)
Spoke with patient's wife.   She states patient ran out of Gabapentin for the last week and tremor has gotten worse. He is now back on it. They will monitor tremor while on Gabapentin and see if it remains worse.   Also ran out of Neupro over the weekend because the pharmacy was out and had to order it. Gave samples in case they are needed in the future.

## 2017-08-07 ENCOUNTER — Other Ambulatory Visit: Payer: Self-pay | Admitting: Cardiology

## 2017-08-09 ENCOUNTER — Telehealth: Payer: Self-pay | Admitting: Cardiology

## 2017-08-09 ENCOUNTER — Other Ambulatory Visit: Payer: Self-pay | Admitting: Cardiology

## 2017-08-09 MED ORDER — DISOPYRAMIDE PHOSPHATE 150 MG PO CAPS: 150.0000 mg | ORAL_CAPSULE | Freq: Two times a day (BID) | ORAL | 2 refills | Status: DC

## 2017-08-09 NOTE — Telephone Encounter (Signed)
Pt c/o medication issue:  1. Name of Medication: disopyramide   2. How are you currently taking this medication (dosage and times per day)? 150 mg// twice daily  3. Are you having a reaction (difficulty breathing--STAT)? no  4. What is your medication issue? Patient states that pharmacy could not fill prescription because they were out of the medication. Patient is in donut hole

## 2017-08-09 NOTE — Telephone Encounter (Signed)
SPOKE WITH PHARMACIST AT [facility] THAT PT JUST NEEDS REFILLS.
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
## 2017-09-04 ENCOUNTER — Ambulatory Visit: Payer: Medicare Other | Admitting: Neurology

## 2017-09-04 ENCOUNTER — Encounter: Payer: Self-pay | Admitting: Neurology

## 2017-09-04 VITALS — BP 124/78 | HR 62 | Ht 77.0 in | Wt 219.0 lb

## 2017-09-04 DIAGNOSIS — M79674 Pain in right toe(s): Secondary | ICD-10-CM

## 2017-09-04 DIAGNOSIS — R32 Unspecified urinary incontinence: Secondary | ICD-10-CM

## 2017-09-04 DIAGNOSIS — G20 Parkinson's disease: Secondary | ICD-10-CM

## 2017-09-04 DIAGNOSIS — M79675 Pain in left toe(s): Secondary | ICD-10-CM

## 2017-09-04 MED ORDER — CARBIDOPA-LEVODOPA 25-100 MG PO TABS: ORAL_TABLET | ORAL | 1 refills | Status: DC

## 2017-09-04 NOTE — Patient Instructions (Signed)
  [facility]  The [facility] team know that living well with a movement disorder extends far beyond our clinic walls. We are together with you. Our team is passionate about providing resources to you and your loved ones who are living with Parkinson's disease and movement disorders. Participate in these programs and join our community. These resources are free or low cost!   [facility] is adding:   Innovative educational programs for patients and caregivers.   Support groups for patients and caregivers living with Parkinson's disease.   Parkinson's specific exercise programs.   Custom tailored therapeutic programs that will benefit patient's living with Parkinson's disease.   We are in this together. You can help and contribute to grow these programs and resources in our community. 100% of the funds donated to the Movement Disorders Fund stays right here in our community to support patients and their caregivers.  To make a tax deductible contribution:  -ask for a [facility]'s envelope in the office today.  - call the Office of Institutional Advancement at [phone].         

## 2017-09-18 ENCOUNTER — Ambulatory Visit: Payer: Medicare Other | Admitting: *Deleted

## 2017-09-19 NOTE — Progress Notes (Addendum)
Subjective:   Jeff [name] is a 67 y.o. male who presents for Medicare Annual/Subsequent preventive examination. 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  Review of Systems: No ROS.  Medicare Wellness Visit. Additional risk factors are reflected in the social history.  Cardiac Risk Factors include: advanced age (>61men, >11 women);dyslipidemia;male gender Sleep patterns: Taking 6mg  Melatonin.  Home Safety/Smoke Alarms: Feels safe in home. Smoke alarms in place.  Living environment; residence and Firearm Safety: Lives with wife. No stairs. Walk-in shower   Male:   CCS- Due July 2019 w/ Dr.Stark.      PSA-  Lab Results  Component Value Date   PSA 1.81 07/05/2017   PSA 3.13 01/25/2017   PSA 2.71 09/17/2016       Objective:    Vitals: BP 106/60 (BP Location: Left Arm, Patient Position: Sitting, Cuff Size: Normal)   Pulse 63   Ht 6\' 5"  (1.956 m)   Wt 225 lb (102.1 kg)   SpO2 98%   BMI 26.68 kg/m   Body mass index is 26.68 kg/m.  Advanced Directives 09/24/2017 09/17/2016 02/18/2015 05/13/2014 05/11/2013  Does Patient Have a Medical Advance Directive? Yes Yes Yes No Patient has advance directive, copy not in chart  Type of Advance Directive [place];Living will [place];Living will - - [place];Living will  Does patient want to make changes to medical advance directive? No - Patient declined - No - Patient declined - -  Copy of [place] in Chart? - No - copy requested No - copy requested - Copy requested from family  Would patient like information on creating a medical advance directive? - No - Patient declined - No - patient declined information -  Pre-existing out of facility DNR order (yellow form or pink MOST form) - - - - No    Tobacco Social History   Tobacco Use  Smoking Status Former Smoker  . Types: Cigarettes  Smokeless Tobacco Never Used  Tobacco Comment   quit smoking  in the  70s     Counseling given: Not Answered Comment: quit smoking  in the 70s   Clinical Intake:     Pain : 0-10 Pain Score: 3  Pain Location: Back Pain Orientation: Lower Pain Onset: More than a month ago Pain Frequency: Occasional Pain Relieving Factors: ibuprofen  Pain Relieving Factors: ibuprofen              Past Medical History:  Diagnosis Date  . Allergic rhinitis   . Atrial fibrillation ([place]) 09/2008   hx of  . Atrial flutter ([place]) 09/2008   hx of  . Cataract    immature but not sure which eye  . Enlarged prostate   . Heart murmur   . Hemorrhoids    hx of  . Hyperlipidemia   . Hypertr obst cardiomyop    takes Amoxicillin prior to dental work and Metoprolol daily  . Insomnia    but no meds required  . Neuropathy   . Parkinsonism ([place])    takes Mirapex tid and Sinemet tid as well  . Peripheral edema    right foot  . Psoriasis    uses Temovate cream as needed   Past Surgical History:  Procedure Laterality Date  . INGUINAL HERNIA REPAIR Bilateral 05/14/2013   Procedure: LAPAROSCOPIC INGUINAL HERNIA REPAIR ;  Surgeon: Harl Bowie, MD;  Location: [facility];  Service: General;  Laterality: Bilateral;  . INSERTION OF MESH  Bilateral 05/14/2013   Procedure: INSERTION OF MESH;  Surgeon: Harl Bowie, MD;  Location: [facility];  Service: General;  Laterality: Bilateral;  . POLYPECTOMY    . TONSILLECTOMY     as a child  . TOOTH EXTRACTION     Family History  Problem Relation Age of Onset  . Alzheimer's disease Father   . Diabetes Father   . Heart disease Father        age ?  Marland Kitchen Heart murmur Brother   . Hypertension Neg Hx   . Stroke Neg Hx   . Colon cancer Neg Hx   . Prostate cancer Neg Hx    Social History   Socioeconomic History  . Marital status: Married    Spouse name: Pamala Hurry  . Number of children: 0  . Years of education: Not on file  . Highest education level: Not on file  Occupational History  . Occupation: [employer]       Comment: Administrator, sports: [employer]  Social Needs  . Financial resource strain: Not on file  . Food insecurity:    Worry: Not on file    Inability: Not on file  . Transportation needs:    Medical: Not on file    Non-medical: Not on file  Tobacco Use  . Smoking status: Former Smoker    Types: Cigarettes  . Smokeless tobacco: Never Used  . Tobacco comment: quit smoking  in the 70s  Substance and Sexual Activity  . Alcohol use: Yes    Alcohol/week: 1.8 oz    Types: 3 Standard drinks or equivalent per week    Comment: beer or vodka a couple of times a week  . Drug use: Yes    Types: Marijuana    Comment: Smokes marijuana once a week  . Sexual activity: Not Currently  Lifestyle  . Physical activity:    Days per week: Not on file    Minutes per session: Not on file  . Stress: Not on file  Relationships  . Social connections:    Talks on phone: Not on file    Gets together: Not on file    Attends religious service: Not on file    Active member of club or organization: Not on file    Attends meetings of clubs or organizations: Not on file    Relationship status: Not on file  Other Topics Concern  . Not on file  Social History Narrative  . Not on file    Outpatient Encounter Medications as of 09/24/2017  Medication Sig  . aspirin EC 325 MG tablet Take 325 mg by mouth daily.    . carbidopa-levodopa (SINEMET CR) 50-200 MG tablet Take 1 tablet by mouth at bedtime.  . carbidopa-levodopa (SINEMET IR) 25-100 MG tablet 2 at 6am/2 at 10am/2 at 2pm/1 at 6pm  . Cholecalciferol (VITAMIN D3) 2000 units TABS Take 2,000 Units by mouth every other day.  . clonazePAM (KLONOPIN) 0.25 MG disintegrating tablet Take 1-2 tablets (0.25-0.5 mg total) by mouth at bedtime as needed for seizure.  . disopyramide (NORPACE) 150 MG capsule Take 1 capsule (150 mg total) by mouth 2 (two) times daily.  . fluticasone (FLONASE) 50 MCG/ACT nasal spray As directed  . gabapentin  (NEURONTIN) 300 MG capsule TAKE ONE CAPSULE BY MOUTH EVERY MORNING, 2 CAPSULES AT BEDTIME  . Melatonin 3 MG CAPS Take 1 capsule by mouth at bedtime.  . metoprolol succinate (TOPROL-XL) 25 MG 24 hr tablet Take  1 tablet (25 mg total) by mouth daily.  . rotigotine (NEUPRO) 6 MG/24HR APPLY 1 PATCH EXTERNALLY TO THE SKIN DAILY  . tamsulosin (FLOMAX) 0.4 MG CAPS capsule Take 0.4 mg by mouth daily.  . vitamin B-12 (CYANOCOBALAMIN) 1000 MCG tablet Take 1,000 mcg by mouth daily.  . clobetasol cream (TEMOVATE) 6.23 % Apply 1 application topically 2 (two) times daily as needed (for skin irritation).  . [DISCONTINUED] metoprolol succinate (TOPROL-XL) 25 MG 24 hr tablet Take 1 tablet (25 mg total) by mouth daily.   No facility-administered encounter medications on file as of 09/24/2017.     Activities of Daily Living In your present state of health, do you have any difficulty performing the following activities: 09/24/2017 09/20/2017  Hearing? N N  Vision? N N  Difficulty concentrating or making decisions? N N  Walking or climbing stairs? N N  Dressing or bathing? N N  Doing errands, shopping? N N  Preparing Food and eating ? N -  Using the Toilet? N -  In the past six months, have you accidently leaked urine? Y -  Comment wears depends if going out. -  Do you have problems with loss of bowel control? N -  Managing your Medications? N -  Managing your Finances? N -  Housekeeping or managing your Housekeeping? N -  Some recent data might be hidden    Patient Care Team: Colon Branch, MD as PCP - General Stanford Breed, Denice Bors, MD as Consulting Physician (Cardiology) Tat, Eustace Quail, DO as Consulting Physician (Neurology) Rutherford Guys, MD as Consulting Physician (Ophthalmology) Paulla Dolly Tamala Fothergill, DPM as Consulting Physician (Podiatry)   Assessment:   This is a routine wellness examination for [place]. Physical assessment deferred to PCP.  Exercise Activities and Dietary recommendations Current Exercise  Habits: Home exercise routine, Type of exercise: walking, Time (Minutes): 60, Frequency (Times/Week): 5, Weekly Exercise (Minutes/Week): 300, Intensity: Mild Diet (meal preparation, eat out, water intake, caffeinated beverages, dairy products, fruits and vegetables): in general, a "healthy" diet  , well balanced   Goals    . I would like to walk more with friend Jeff [name].   (pt-stated)       Fall Risk Fall Risk  09/24/2017 09/20/2017 09/04/2017 05/01/2017 09/17/2016  Falls in the past year? No No No No No    Depression Screen PHQ 2/9 Scores 09/24/2017 09/20/2017 09/17/2016 01/10/2016  PHQ - 2 Score 0 0 0 0    Cognitive Function Ad8 score reviewed for issues:  Issues making decisions:no  Less interest in hobbies / activities:no  Repeats questions, stories (family complaining):no  Trouble using ordinary gadgets (microwave, computer, phone):no  Forgets the month or year: no  Mismanaging finances: no  Remembering appts:no  Daily problems with thinking and/or memory:no Ad8 score is=0  MMSE - Mini Mental State Exam 09/17/2016 02/18/2015  Orientation to time 5 5  Orientation to Place 5 5  Registration 3 3  Attention/ Calculation 5 5  Recall 3 3  Language- name 2 objects 2 2  Language- repeat 1 1  Language- follow 3 step command 3 3  Language- read & follow direction 1 1  Write a sentence 1 1  Copy design 1 1  Total score 30 30        Immunization History  Administered Date(s) Administered  . Influenza Split 03/13/2011, 02/25/2012  . Influenza Whole 02/25/2009, 03/13/2010  . Influenza, High Dose Seasonal PF 03/09/2016, 03/22/2017  . Influenza,inj,Quad PF,6+ Mos 02/06/2013, 01/20/2014, 02/11/2015  . Pneumococcal Conjugate-13  02/04/2017  . Pneumococcal Polysaccharide-23 01/10/2016  . Tdap 07/01/2012  . Zoster 03/22/2011  . Zoster Recombinat (Shingrix) 03/25/2017, 05/29/2017    Screening Tests Health Maintenance  Topic Date Due  . COLONOSCOPY  11/20/2017  . INFLUENZA  VACCINE  12/26/2017  . TETANUS/TDAP  07/01/2022  . Hepatitis C Screening  Completed  . PNA vac Low Risk Adult  Completed        Plan:   Follow up with Dr.Paz as directed.  Schedule appointment to see me again in 1 yr.   Keep up the great work of walking daily.  Drink more water and include more fruits and vegetables in your diet.   Schedule colonoscopy this summer.  I have personally reviewed and noted the following in the patient's chart:   . Medical and social history . Use of alcohol, tobacco or illicit drugs  . Current medications and supplements . Functional ability and status . Nutritional status . Physical activity . Advanced directives . List of other physicians . Hospitalizations, surgeries, and ER visits in previous 12 months . Vitals . Screenings to include cognitive, depression, and falls . Referrals and appointments  In addition, I have reviewed and discussed with patient certain preventive protocols, quality metrics, and best practice recommendations. A written personalized care plan for preventive services as well as general preventive health recommendations were provided to patient.     Naaman Plummer [place], [place]  09/24/2017  Kathlene November, MD

## 2017-09-20 ENCOUNTER — Encounter: Payer: Self-pay | Admitting: Internal Medicine

## 2017-09-20 ENCOUNTER — Ambulatory Visit: Payer: Medicare Other | Admitting: Internal Medicine

## 2017-09-20 VITALS — BP 130/78 | HR 58 | Temp 97.4°F | Resp 14 | Ht 77.0 in | Wt 221.2 lb

## 2017-09-20 DIAGNOSIS — G4700 Insomnia, unspecified: Secondary | ICD-10-CM

## 2017-09-20 DIAGNOSIS — R399 Unspecified symptoms and signs involving the genitourinary system: Secondary | ICD-10-CM

## 2017-09-20 DIAGNOSIS — N390 Urinary tract infection, site not specified: Secondary | ICD-10-CM | POA: Diagnosis not present

## 2017-09-20 DIAGNOSIS — I4892 Unspecified atrial flutter: Secondary | ICD-10-CM

## 2017-09-20 LAB — POC URINALSYSI DIPSTICK (AUTOMATED)
Bilirubin, UA: NEGATIVE
Glucose, UA: NEGATIVE
Ketones, UA: NEGATIVE
Nitrite, UA: NEGATIVE
PH UA: 6 (ref 5.0–8.0)
UROBILINOGEN UA: 0.2 U/dL

## 2017-09-20 LAB — URINALYSIS, ROUTINE W REFLEX MICROSCOPIC
Bilirubin Urine: NEGATIVE
Ketones, ur: NEGATIVE
Nitrite: NEGATIVE
PH: 5.5 (ref 5.0–8.0)
Specific Gravity, Urine: 1.025 (ref 1.000–1.030)
Total Protein, Urine: NEGATIVE
Urine Glucose: NEGATIVE
Urobilinogen, UA: 0.2 (ref 0.0–1.0)

## 2017-09-20 MED ORDER — CLONAZEPAM 0.25 MG PO TBDP: 0.2500 mg | ORAL_TABLET | Freq: Every evening | ORAL | 0 refills | Status: DC | PRN

## 2017-09-20 MED ORDER — METOPROLOL SUCCINATE ER 25 MG PO TB24: 25.0000 mg | ORAL_TABLET | Freq: Every day | ORAL | 1 refills | Status: DC

## 2017-09-20 NOTE — Patient Instructions (Addendum)
  GO TO THE FRONT DESK Schedule your next appointment for physical exam by October 2019  Take clonazepam 1 or 2 tablets at bedtime, watch for excessive somnolence.  Continue melatonin, 6 mg  Call if you are not sleeping better    HEALTHY SLEEP Sleep hygiene: Basic rules for a good night's sleep  Sleep only as much as you need to feel rested and then get out of bed  Keep a regular sleep schedule  Avoid forcing sleep  Exercise regularly for at least 20 minutes, preferably 4 to 5 hours before bedtime  Avoid caffeinated beverages after lunch  Avoid alcohol near bedtime: no "night cap"  Avoid smoking, especially in the evening  Do not go to bed hungry  Adjust bedroom environment  Avoid prolonged use of light-emitting screens before bedtime   Deal with your worries before bedtime

## 2017-09-20 NOTE — Progress Notes (Signed)
Pre visit review using our clinic review tool, if applicable. No additional management support is needed unless otherwise documented below in the visit note. 

## 2017-09-20 NOTE — Progress Notes (Signed)
Subjective:    Patient ID: Jeff [name], male    DOB: 10/20/1950, 67 y.o.   MRN: [id number]  DOS:  09/20/2017 Type of visit - description : rov Interval history: Routine checkup, he has some concerns Several months history of difficulty sleeping, he usually wakes up and has a  hard time falling asleep again. Admits to some stress related to his future retirement and his wife's health.  Neurology prescribed melatonin but that has not helped much.  In their note they state that he declined clonazepam.  Also, takes walks regularly, shortly after he started his routine he has bilateral big toe pain.  No swelling or redness.  Pain goes away after a few minutes. Denies claudication per se Has neuropathy in a sock distribution.  Unchanged  2 weeks ago developed L UTS again: Severe urgency with small amount of urine output.  Occasional difficulty urinating. Denies dysuria, gross hematuria. No fever chills No nausea, vomiting, diarrhea. No flank pain   Review of Systems As above  Past Medical History:  Diagnosis Date  . Allergic rhinitis   . Atrial fibrillation ([facility]) 09/2008   hx of  . Atrial flutter ([facility]) 09/2008   hx of  . Cataract    immature but not sure which eye  . Enlarged prostate   . Heart murmur   . Hemorrhoids    hx of  . Hyperlipidemia   . Hypertr obst cardiomyop    takes Amoxicillin prior to dental work and Metoprolol daily  . Insomnia    but no meds required  . Neuropathy   . Parkinsonism ([place])    takes Mirapex tid and Sinemet tid as well  . Peripheral edema    right foot  . Psoriasis    uses Temovate cream as needed    Past Surgical History:  Procedure Laterality Date  . INGUINAL HERNIA REPAIR Bilateral 05/14/2013   Procedure: LAPAROSCOPIC INGUINAL HERNIA REPAIR ;  Surgeon: Harl Bowie, MD;  Location: [facility];  Service: General;  Laterality: Bilateral;  . INSERTION OF MESH Bilateral 05/14/2013   Procedure: INSERTION OF MESH;  Surgeon: Harl Bowie, MD;  Location: [facility];  Service: General;  Laterality: Bilateral;  . POLYPECTOMY    . TONSILLECTOMY     as a child    Social History   Socioeconomic History  . Marital status: Married    Spouse name: Pamala Hurry  . Number of children: 0  . Years of education: Not on file  . Highest education level: Not on file  Occupational History  . 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 Financial resource strain: Not on file  . Food insecurity:    Worry: Not on file    Inability: Not on file  . Transportation needs:    Medical: Not on file    Non-medical: Not on file  Tobacco Use  . Smoking status: Former Smoker    Types: Cigarettes  . Smokeless tobacco: Never Used  . Tobacco comment: quit smoking  in the 70s  Substance and Sexual Activity  . Alcohol use: Yes    Alcohol/week: 1.8 oz    Types: 3 Standard drinks or equivalent per week    Comment: beer or vodka a couple of times a week  . Drug use: Yes    Comment: Smokes marijuana once a week  . Sexual activity: Not Currently  Lifestyle  .  Physical activity:    Days per week: Not on file    Minutes per session: Not on file  . Stress: Not on file  Relationships  . Social connections:    Talks on phone: Not on file    Gets together: Not on file    Attends religious service: Not on file    Active member of club or organization: Not on file    Attends meetings of clubs or organizations: Not on file    Relationship status: Not on file  . Intimate partner violence:    Fear of current or ex partner: Not on file    Emotionally abused: Not on file    Physically abused: Not on file    Forced sexual activity: Not on file  Other Topics Concern  . Not on file  Social History Narrative  . Not on file      Allergies as of 09/20/2017   No Known Allergies     Medication List        Accurate as of 09/20/17 11:59 PM. Always use your most recent med list.           aspirin EC 325 MG tablet Take 325 mg by mouth daily.   carbidopa-levodopa 50-200 MG tablet Commonly known as:  SINEMET CR Take 1 tablet by mouth at bedtime.   carbidopa-levodopa 25-100 MG tablet Commonly known as:  SINEMET IR 2 at 6am/2 at 10am/2 at 2pm/1 at 6pm   clobetasol cream 0.05 % Commonly known as:  TEMOVATE Apply 1 application topically 2 (two) times daily as needed (for skin irritation).   clonazePAM 0.25 MG disintegrating tablet Commonly known as:  KLONOPIN Take 1-2 tablets (0.25-0.5 mg total) by mouth at bedtime as needed for seizure.   disopyramide 150 MG capsule Commonly known as:  NORPACE Take 1 capsule (150 mg total) by mouth 2 (two) times daily.   fluticasone 50 MCG/ACT nasal spray Commonly known as:  FLONASE As directed   gabapentin 300 MG capsule Commonly known as:  NEURONTIN TAKE ONE CAPSULE BY MOUTH EVERY MORNING, 2 CAPSULES AT BEDTIME   Melatonin 3 MG Caps Take 1 capsule by mouth at bedtime.   metoprolol succinate 25 MG 24 hr tablet Commonly known as:  TOPROL-XL Take 1 tablet (25 mg total) by mouth daily.   rotigotine 6 MG/24HR Commonly known as:  NEUPRO APPLY 1 PATCH EXTERNALLY TO THE SKIN DAILY   tamsulosin 0.4 MG Caps capsule Commonly known as:  FLOMAX Take 0.4 mg by mouth daily.   vitamin B-12 1000 MCG tablet Commonly known as:  CYANOCOBALAMIN Take 1,000 mcg by mouth daily.   Vitamin D3 2000 units Tabs Take 2,000 Units by mouth every other day.          Objective:   Physical Exam BP 130/78 (BP Location: Left Arm, Patient Position: Sitting, Cuff Size: Normal)   Pulse (!) 58   Temp (!) 97.4 F (36.3 C) (Oral)   Resp 14   Ht 6\' 5"  (1.956 m)   Wt 221 lb 4 oz (100.4 kg)   SpO2 97%   BMI 26.24 kg/m  General:   Well developed, well nourished . NAD.  HEENT:  Normocephalic . Face symmetric, atraumatic  Abdomen:  Not distended, soft, non-tender. No rebound or rigidity. No CVA tenderness MSK: Feet: No edema, good  pedal pulses, minimal DJD changes at the great toes but no synovitis on exam. Rectal: External abnormalities: none. Normal sphincter tone. No rectal masses or tenderness.  No  stools Prostate: Prostate gland firm and smooth, no enlargement, nodularity, tenderness, mass, asymmetry or induration Skin: Not pale. Not jaundice Neurologic:  alert & oriented X3.  Speech normal, gait appropriate for age and unassisted Psych--  Cognition and judgment appear intact.  Cooperative with normal attention span and concentration.  Behavior appropriate. No anxious or depressed appearing.     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  Will send a UA, urine culture, RX with results. Previously had a UTI in 2014 and a UTI enterococcus ~ 01/2017.  If this UCX is +, will need urology referral. Increased PSA velocity?:  Last PSA satisfactory, DRE today negative Insomnia: Healthy habits discussed, see AVS.  Continue melatonin,add clonazepam Toe pain: Recommend observation.   A flutter: Refill beta-blockers RTC 02-2018 [facility]

## 2017-09-22 LAB — URINE CULTURE
MICRO NUMBER: 90512367
SPECIMEN QUALITY: ADEQUATE

## 2017-09-22 NOTE — Assessment & Plan Note (Signed)
UTI: Present with L UTS for 2 weeks, no fever chills, Udip + .  Will send a UA, urine culture, RX with results. Previously had a UTI in 2014 and a UTI enterococcus ~ 01/2017.  If this UCX is +, will need urology referral. Increased PSA velocity?:  Last PSA satisfactory, DRE today negative Insomnia: Healthy habits discussed, see AVS.  Continue melatonin,add clonazepam Toe pain: Recommend observation.   A flutter: Refill beta-blockers RTC 02-2018 [facility]

## 2017-09-24 ENCOUNTER — Encounter: Payer: Self-pay | Admitting: *Deleted

## 2017-09-24 ENCOUNTER — Ambulatory Visit (INDEPENDENT_AMBULATORY_CARE_PROVIDER_SITE_OTHER): Payer: Medicare Other | Admitting: *Deleted

## 2017-09-24 VITALS — BP 106/60 | HR 63 | Ht 77.0 in | Wt 225.0 lb

## 2017-09-24 DIAGNOSIS — Z0000 Encounter for general adult medical examination without abnormal findings: Secondary | ICD-10-CM

## 2017-09-24 MED ORDER — AMOXICILLIN 875 MG PO TABS: 875.0000 mg | ORAL_TABLET | Freq: Two times a day (BID) | ORAL | 0 refills | Status: DC

## 2017-09-24 NOTE — Addendum Note (Signed)
Addended byDamita Dunnings D on: 09/24/2017 05:14 PM   Modules accepted: Orders

## 2017-09-24 NOTE — Patient Instructions (Signed)
Follow up with Dr.Paz as directed.  Schedule appointment to see me again in 1 yr.   Keep up the great work of walking daily.  Drink more water and include more fruits and vegetables in your diet.   Schedule colonoscopy this summer.    [name] , Thank you for taking time to come for your Medicare Wellness Visit. I appreciate your ongoing commitment to your health goals. Please review the following plan we discussed and let me know if I can assist you in the future.   These are the goals we discussed: Goals    . I would like to walk more with friend [name].   (pt-stated)       This is a list of the screening recommended for you and due dates:  Health Maintenance  Topic Date Due  . Colon Cancer Screening  11/20/2017  . Flu Shot  12/26/2017  . Tetanus Vaccine  07/01/2022  .  Hepatitis C: One time screening is recommended by Center for Disease Control  (CDC) for  adults born from [birth year] through 1966.   Completed  . Pneumonia vaccines  Completed    Health Maintenance, Male A healthy lifestyle and preventive care is important for your health and wellness. Ask your health care provider about what schedule of regular examinations is right for you. What should I know about weight and diet? Eat a Healthy Diet  Eat plenty of vegetables, fruits, whole grains, low-fat dairy products, and lean protein.  Do not eat a lot of foods high in solid fats, added sugars, or salt.  Maintain a Healthy Weight Regular exercise can help you achieve or maintain a healthy weight. You should:  Do at least 150 minutes of exercise each week. The exercise should increase your heart rate and make you sweat (moderate-intensity exercise).  Do strength-training exercises at least twice a week.  Watch Your Levels of Cholesterol and Blood Lipids  Have your blood tested for lipids and cholesterol every 5 years starting at 68 years of age. If you are at high risk for heart disease, you should start having your  blood tested when you are 68 years old. You may need to have your cholesterol levels checked more often if: ? Your lipid or cholesterol levels are high. ? You are older than 68 years of age. ? You are at high risk for heart disease.  What should I know about cancer screening? Many types of cancers can be detected early and may often be prevented. Lung Cancer  You should be screened every year for lung cancer if: ? You are a current smoker who has smoked for at least 30 years. ? You are a former smoker who has quit within the past 15 years.  Talk to your health care provider about your screening options, when you should start screening, and how often you should be screened.  Colorectal Cancer  Routine colorectal cancer screening usually begins at 68 years of age and should be repeated every 5-10 years until you are 68 years old. You may need to be screened more often if early forms of precancerous polyps or small growths are found. Your health care provider may recommend screening at an earlier age if you have risk factors for colon cancer.  Your health care provider may recommend using home test kits to check for hidden blood in the stool.  A small camera at the end of a tube can be used to examine your colon (sigmoidoscopy or colonoscopy). This  checks for the earliest forms of colorectal cancer.  Prostate and Testicular Cancer  Depending on your age and overall health, your health care provider may do certain tests to screen for prostate and testicular cancer.  Talk to your health care provider about any symptoms or concerns you have about testicular or prostate cancer.  Skin Cancer  Check your skin from head to toe regularly.  Tell your health care provider about any new moles or changes in moles, especially if: ? There is a change in a mole's size, shape, or color. ? You have a mole that is larger than a pencil eraser.  Always use sunscreen. Apply sunscreen liberally and  repeat throughout the day.  Protect yourself by wearing long sleeves, pants, a wide-brimmed hat, and sunglasses when outside.  What should I know about heart disease, diabetes, and high blood pressure?  If you are 51-75 years of age, have your blood pressure checked every 3-5 years. If you are 33 years of age or older, have your blood pressure checked every year. You should have your blood pressure measured twice-once when you are at a hospital or clinic, and once when you are not at a hospital or clinic. Record the average of the two measurements. To check your blood pressure when you are not at a hospital or clinic, you can use: ? An automated blood pressure machine at a pharmacy. ? A home blood pressure monitor.  Talk to your health care provider about your target blood pressure.  If you are between 75-52 years old, ask your health care provider if you should take aspirin to prevent heart disease.  Have regular diabetes screenings by checking your fasting blood sugar level. ? If you are at a normal weight and have a low risk for diabetes, have this test once every three years after the age of 70. ? If you are overweight and have a high risk for diabetes, consider being tested at a younger age or more often.  A one-time screening for abdominal aortic aneurysm (AAA) by ultrasound is recommended for men aged 17-75 years who are current or former smokers. What should I know about preventing infection? Hepatitis B If you have a higher risk for hepatitis B, you should be screened for this virus. Talk with your health care provider to find out if you are at risk for hepatitis B infection. Hepatitis C Blood testing is recommended for:  Everyone born from [birth year] through 1966.  Anyone with known risk factors for hepatitis C.  Sexually Transmitted Diseases (STDs)  You should be screened each year for STDs including gonorrhea and chlamydia if: ? You are sexually active and are younger than 68  years of age. ? You are older than 68 years of age and your health care provider tells you that you are at risk for this type of infection. ? Your sexual activity has changed since you were last screened and you are at an increased risk for chlamydia or gonorrhea. Ask your health care provider if you are at risk.  Talk with your health care provider about whether you are at high risk of being infected with HIV. Your health care provider may recommend a prescription medicine to help prevent HIV infection.  What else can I do?  Schedule regular health, dental, and eye exams.  Stay current with your vaccines (immunizations).  Do not use any tobacco products, such as cigarettes, chewing tobacco, and e-cigarettes. If you need help quitting, ask your health care provider.  Limit alcohol intake to no more than 2 drinks per day. One drink equals 12 ounces of beer, 5 ounces of wine, or 1 ounces of hard liquor.  Do not use street drugs.  Do not share needles.  Ask your health care provider for help if you need support or information about quitting drugs.  Tell your health care provider if you often feel depressed.  Tell your health care provider if you have ever been abused or do not feel safe at home. This information is not intended to replace advice given to you by your health care provider. Make sure you discuss any questions you have with your health care provider. Document Released: 11/10/2007 Document Revised: 01/11/2016 Document Reviewed: 02/15/2015 Elsevier Interactive Patient Education  [employer].

## 2017-09-25 ENCOUNTER — Encounter: Payer: Self-pay | Admitting: Neurology

## 2017-10-16 ENCOUNTER — Encounter: Payer: Self-pay | Admitting: Gastroenterology

## 2017-10-29 ENCOUNTER — Other Ambulatory Visit: Payer: Self-pay | Admitting: Neurology

## 2017-12-04 ENCOUNTER — Encounter: Payer: Self-pay | Admitting: Internal Medicine

## 2017-12-04 DIAGNOSIS — H9193 Unspecified hearing loss, bilateral: Secondary | ICD-10-CM

## 2017-12-12 ENCOUNTER — Ambulatory Visit (AMBULATORY_SURGERY_CENTER): Payer: Self-pay | Admitting: *Deleted

## 2017-12-12 ENCOUNTER — Other Ambulatory Visit: Payer: Self-pay

## 2017-12-12 VITALS — Ht 74.0 in | Wt 216.8 lb

## 2017-12-12 DIAGNOSIS — Z86010 Personal history of colonic polyps: Secondary | ICD-10-CM

## 2017-12-12 MED ORDER — SUPREP BOWEL PREP KIT 17.5-3.13-1.6 GM/177ML PO SOLN: 1.0000 | Freq: Once | ORAL | 0 refills | Status: AC

## 2017-12-12 NOTE — Progress Notes (Signed)
Patient denies any allergies to egg or soy products. Patient denies complications with anesthesia/sedation.  Patient denies oxygen use at home and denies diet medications.  Denies information on colonoscopy.

## 2017-12-13 ENCOUNTER — Encounter: Payer: Self-pay | Admitting: Gastroenterology

## 2017-12-15 ENCOUNTER — Other Ambulatory Visit: Payer: Self-pay | Admitting: Medical

## 2017-12-26 ENCOUNTER — Encounter: Payer: Self-pay | Admitting: Gastroenterology

## 2017-12-26 ENCOUNTER — Ambulatory Visit (AMBULATORY_SURGERY_CENTER): Payer: Medicare Other | Admitting: Gastroenterology

## 2017-12-26 VITALS — BP 122/74 | HR 50 | Temp 98.4°F | Resp 18 | Ht 74.0 in | Wt 216.0 lb

## 2017-12-26 DIAGNOSIS — Z86010 Personal history of colonic polyps: Secondary | ICD-10-CM

## 2017-12-26 DIAGNOSIS — D123 Benign neoplasm of transverse colon: Secondary | ICD-10-CM

## 2017-12-26 DIAGNOSIS — D120 Benign neoplasm of cecum: Secondary | ICD-10-CM | POA: Diagnosis not present

## 2017-12-26 MED ORDER — SODIUM CHLORIDE 0.9 % IV SOLN: 500.0000 mL | Freq: Once | INTRAVENOUS | Status: DC

## 2017-12-26 NOTE — Progress Notes (Signed)
Called to room to assist during endoscopic procedure.  Patient ID and intended procedure confirmed with present staff. Received instructions for my participation in the procedure from the performing physician.  

## 2017-12-26 NOTE — Progress Notes (Signed)
Report given to PACU, vss 

## 2017-12-26 NOTE — Op Note (Signed)
[facility] Patient Name: Jeff [name] Procedure Date: 12/26/2017 12:03 PM MRN: [id number] Endoscopist: Ladene Artist , MD Age: 67 Referring MD:  Date of Birth: [birth year] Gender: Male Account #: [id number] Procedure:                Colonoscopy Indications:              Surveillance: Personal history of adenomatous                            polyps on last colonoscopy 5 years ago Medicines:                Monitored Anesthesia Care Procedure:                Pre-Anesthesia Assessment:                           - Prior to the procedure, a History and Physical                            was performed, and patient medications and                            allergies were reviewed. The patient's tolerance of                            previous anesthesia was also reviewed. The risks                            and benefits of the procedure and the sedation                            options and risks were discussed with the patient.                            All questions were answered, and informed consent                            was obtained. Prior Anticoagulants: The patient has                            taken no previous anticoagulant or antiplatelet                            agents. ASA Grade Assessment: II - A patient with                            mild systemic disease. After reviewing the risks                            and benefits, the patient was deemed in                            satisfactory condition to undergo the procedure.  After obtaining informed consent, the colonoscope                            was passed under direct vision. Throughout the                            procedure, the patient's blood pressure, pulse, and                            oxygen saturations were monitored continuously. The                            Colonoscope was introduced through the anus and                            advanced to the the cecum,  identified by                            appendiceal orifice and ileocecal valve. The                            ileocecal valve, appendiceal orifice, and rectum                            were photographed. The quality of the bowel                            preparation was good. The colonoscopy was performed                            without difficulty. The patient tolerated the                            procedure well. Scope In: 12:11:25 PM Scope Out: 12:28:51 PM Scope Withdrawal Time: 0 hours 14 minutes 42 seconds  Total Procedure Duration: 0 hours 17 minutes 26 seconds  Findings:                 The perianal and digital rectal examinations were                            normal.                           Two sessile polyps were found in the transverse                            colon and cecum. The polyps were 6 to 7 mm in size.                            These polyps were removed with a cold snare.                            Resection and retrieval were complete.  A few small-mouthed diverticula were found in the                            left colon.                           Internal hemorrhoids were found during                            retroflexion. The hemorrhoids were small and Grade                            I (internal hemorrhoids that do not prolapse).                           The exam was otherwise without abnormality on                            direct and retroflexion views. Complications:            No immediate complications. Estimated blood loss:                            None. Estimated Blood Loss:     Estimated blood loss: none. Impression:               - Two 6 to 7 mm polyps in the transverse colon and                            in the cecum, removed with a cold snare. Resected                            and retrieved.                           - Diverticulosis in the left colon.                           - Internal  hemorrhoids.                           - The examination was otherwise normal on direct                            and retroflexion views. Recommendation:           - Repeat colonoscopy in 5 years for surveillance.                           - Patient has a contact number available for                            emergencies. The signs and symptoms of potential                            delayed complications were discussed with the  patient. Return to normal activities tomorrow.                            Written discharge instructions were provided to the                            patient.                           - High fiber diet.                           - Continue present medications.                           - Await pathology results. Ladene Artist, MD 12/26/2017 12:33:54 PM This report has been signed electronically.

## 2017-12-26 NOTE — Patient Instructions (Signed)
YOU HAD AN ENDOSCOPIC PROCEDURE TODAY AT [facility]:   Refer to the procedure report that was given to you for any specific questions about what was found during the examination.  If the procedure report does not answer your questions, please call your gastroenterologist to clarify.  If you requested that your care partner not be given the details of your procedure findings, then the procedure report has been included in a sealed envelope for you to review at your convenience later.  YOU SHOULD EXPECT: Some feelings of bloating in the abdomen. Passage of more gas than usual.  Walking can help get rid of the air that was put into your GI tract during the procedure and reduce the bloating. If you had a lower endoscopy (such as a colonoscopy or flexible sigmoidoscopy) you may notice spotting of blood in your stool or on the toilet paper. If you underwent a bowel prep for your procedure, you may not have a normal bowel movement for a few days.  Please Note:  You might notice some irritation and congestion in your nose or some drainage.  This is from the oxygen used during your procedure.  There is no need for concern and it should clear up in a day or so.  SYMPTOMS TO REPORT IMMEDIATELY:   Following lower endoscopy (colonoscopy or flexible sigmoidoscopy):  Excessive amounts of blood in the stool  Significant tenderness or worsening of abdominal pains  Swelling of the abdomen that is new, acute  Fever of 100F or higher  For urgent or emergent issues, a gastroenterologist can be reached at any hour by calling [phone].   DIET:  We do recommend a small meal at first, but then you may proceed to your regular diet. Follow a High Fiber diet (see handout given to you by your recovery nurse).  Drink plenty of fluids but you should avoid alcoholic beverages for 24 hours.  MEDICATIONS: Continue present medications.  Please see handouts given to you by your recovery  nurse.  ACTIVITY:  You should plan to take it easy for the rest of today and you should NOT DRIVE or use heavy machinery until tomorrow (because of the sedation medicines used during the test).    FOLLOW UP: Our staff will call the number listed on your records the next business day following your procedure to check on you and address any questions or concerns that you may have regarding the information given to you following your procedure. If we do not reach you, we will leave a message.  However, if you are feeling well and you are not experiencing any problems, there is no need to return our call.  We will assume that you have returned to your regular daily activities without incident.  If any biopsies were taken you will be contacted by phone or by letter within the next 1-3 weeks.  Please call us at [phone] if you have not heard about the biopsies in 3 weeks.   Thank you for allowing [place] to provide for your healthcare needs today,  SIGNATURES/CONFIDENTIALITY: You and/or your care partner have signed paperwork which will be entered into your electronic medical record.  These signatures attest to the fact that that the information above on your After Visit Summary has been reviewed and is understood.  Full responsibility of the confidentiality of this discharge information lies with you and/or your care-partner.

## 2017-12-27 ENCOUNTER — Telehealth: Payer: Self-pay | Admitting: *Deleted

## 2017-12-27 ENCOUNTER — Telehealth: Payer: Self-pay

## 2017-12-27 NOTE — Telephone Encounter (Signed)
Called [phone] and call could not go through, called [phone] cell and left a messaged we tried to reach pt for a follow up call. maw

## 2017-12-27 NOTE — Telephone Encounter (Signed)
Pt returned call and said he is doing great today

## 2017-12-27 NOTE — Telephone Encounter (Signed)
Second follow up call attempt,  Phone number out of order.

## 2018-01-07 ENCOUNTER — Encounter: Payer: Self-pay | Admitting: Gastroenterology

## 2018-01-19 ENCOUNTER — Other Ambulatory Visit: Payer: Self-pay | Admitting: Neurology

## 2018-01-22 ENCOUNTER — Ambulatory Visit: Payer: Medicare Other | Admitting: Neurology

## 2018-01-27 ENCOUNTER — Other Ambulatory Visit: Payer: Self-pay | Admitting: Neurology
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
## 2018-01-31 ENCOUNTER — Encounter: Payer: Self-pay | Admitting: Neurology

## 2018-01-31 ENCOUNTER — Ambulatory Visit: Payer: Medicare Other | Admitting: Neurology

## 2018-01-31 VITALS — BP 110/64 | HR 60 | Ht 76.0 in | Wt 215.0 lb

## 2018-01-31 DIAGNOSIS — G20 Parkinson's disease: Secondary | ICD-10-CM

## 2018-01-31 DIAGNOSIS — R32 Unspecified urinary incontinence: Secondary | ICD-10-CM

## 2018-02-15 ENCOUNTER — Encounter: Payer: Self-pay | Admitting: Internal Medicine

## 2018-02-17 MED ORDER — CARBIDOPA-LEVODOPA ER 50-200 MG PO TBCR: 1.0000 | EXTENDED_RELEASE_TABLET | Freq: Every day | ORAL | 1 refills | Status: DC

## 2018-02-17 MED ORDER — METOPROLOL SUCCINATE ER 25 MG PO TB24: 25.0000 mg | ORAL_TABLET | Freq: Every day | ORAL | 1 refills | Status: AC

## 2018-03-05 ENCOUNTER — Ambulatory Visit (INDEPENDENT_AMBULATORY_CARE_PROVIDER_SITE_OTHER): Payer: Medicare Other

## 2018-03-05 DIAGNOSIS — Z23 Encounter for immunization: Secondary | ICD-10-CM

## 2018-03-06 ENCOUNTER — Other Ambulatory Visit: Payer: Self-pay | Admitting: Neurology

## 2018-03-11 ENCOUNTER — Other Ambulatory Visit: Payer: Self-pay | Admitting: Internal Medicine

## 2018-03-11 NOTE — Telephone Encounter (Signed)
Please let the patient known I sent a prescription, he can take 1 or 2 tablets at bedtime, start with only 1 tablet to be sure if he does not get too sleepy.

## 2018-03-11 NOTE — Telephone Encounter (Addendum)
Pt has taken previously- he is aware.

## 2018-03-11 NOTE — Telephone Encounter (Signed)
Pt is requesting refill on clonazepam. No longer on med list.

## 2018-04-15 ENCOUNTER — Other Ambulatory Visit: Payer: Self-pay | Admitting: Cardiology

## 2018-04-17 NOTE — Progress Notes (Signed)
HPI: FU hypertrophic obstructive cardiomyopathy and atrial fibrillation. Renal Dopplers in September 2014 normal. Patient also apparently found to have atrial fibrillation on previous monitor. Declined anticoagulation. Exercise treadmill October 2017 showed a normal blood pressure response. Holter monitor October 2017 showed sinus rhythm with PACs, PVCs, couplets and one 3 beat run of nonsustained ventricular tachycardia. Echocardiogram November 2018 showed ejection fraction 45 to 50%, severe basal septal hypertrophy, moderate diastolic dysfunction, moderate aortic insufficiency, mild mitral regurgitation and severe left atrial enlargement.  Aortic root and ascending aorta mildly dilated (4.3 cm).  Cardiac MRI November 2018 showed dilated cardiomyopathy, asymmetric septal hypertrophy and chordal Sam.  Ejection fraction 43%.  Moderate aortic insufficiency, mild mitral regurgitation, severe left atrial enlargement.  Aortic root measured 40 mm.  Abdominal ultrasound January 2019 showed largest aortic diameter 2.7 cm.  Follow-up study recommended 2 years.  Since he was last seen, the patient denies any dyspnea on exertion, orthopnea, PND, pedal edema, palpitations, syncope or chest pain.   Current Outpatient Medications  Medication Sig Dispense Refill  . amoxicillin (AMOXIL) 875 MG tablet     . aspirin EC 325 MG tablet Take 325 mg by mouth daily.      . carbidopa-levodopa (SINEMET CR) 50-200 MG tablet TAKE 1 TABLET BY MOUTH AT BEDTIME 90 tablet 1  . carbidopa-levodopa (SINEMET IR) 25-100 MG tablet TAKE 2 TABLET BY MOUTH AT 6AM, 2 TABLET BY MOUTH AT 10 AM, 2 TABLET BY MOUTH 2PM AND THEN TAKE 1 TABLET BY MOUTH AT 6PM 810 tablet 1  . Cholecalciferol (VITAMIN D3) 2000 units TABS Take 2,000 Units by mouth every other day.    . clobetasol cream (TEMOVATE) 4.40 % Apply 1 application topically 2 (two) times daily as needed (for skin irritation). 60 g 5  . clonazePAM (KLONOPIN) 0.25 MG disintegrating tablet  DISSOLVE 1 TO 2 TABLETS(0.25 TO 0.5 MG) ON THE TONGUE AT BEDTIME AS NEEDED FOR INSOMNIA (Patient taking differently: Take 0.25 mg by mouth at bedtime. ) 45 tablet 0  . disopyramide (NORPACE) 150 MG capsule TAKE 1 CAPSULE(150 MG) BY MOUTH TWICE DAILY 180 capsule 0  . gabapentin (NEURONTIN) 300 MG capsule TAKE ONE CAPSULE BY MOUTH EVERY MORNING, 2 CAPSULES AT BEDTIME 270 capsule 1  . Melatonin 3 MG CAPS Take 1 capsule by mouth at bedtime.    . metoprolol succinate (TOPROL-XL) 25 MG 24 hr tablet Take 1 tablet (25 mg total) by mouth daily. 90 tablet 1  . rotigotine (NEUPRO) 6 MG/24HR APPLY 1 PATCH EXTERNALLY TO THE SKIN DAILY 7 patch 0  . tamsulosin (FLOMAX) 0.4 MG CAPS capsule Take 0.4 mg by mouth daily.  6  . vitamin B-12 (CYANOCOBALAMIN) 1000 MCG tablet Take 1,000 mcg by mouth daily.     No current facility-administered medications for this visit.      Past Medical History:  Diagnosis Date  . Allergic rhinitis   . Atrial fibrillation ([place]) 09/2008   hx of  . Atrial flutter ([place]) 09/2008   hx of - no problems  . Cataract    immature but not sure which eye  . Enlarged prostate   . Heart murmur    no problems  . Hemorrhoids    hx of  . History of recurrent UTIs   . Hyperlipidemia   . Hypertension   . Hypertr obst cardiomyop    takes Amoxicillin prior to dental work and Metoprolol daily  . Insomnia    but no meds required  . Neuropathy  feet  . Parkinsonism ([place])    takes Mirapex tid and Sinemet tid as well  . Peripheral edema    right foot  . Psoriasis    uses Temovate cream as needed    Past Surgical History:  Procedure Laterality Date  . COLONOSCOPY  10/2012   polyps  . INGUINAL HERNIA REPAIR Bilateral 05/14/2013   Procedure: LAPAROSCOPIC INGUINAL HERNIA REPAIR ;  Surgeon: Harl Bowie, MD;  Location: [place];  Service: General;  Laterality: Bilateral;  . INSERTION OF MESH Bilateral 05/14/2013   Procedure: INSERTION OF MESH;  Surgeon: Harl Bowie, MD;   Location: [place];  Service: General;  Laterality: Bilateral;  . POLYPECTOMY    . TONSILLECTOMY     as a child  . TOOTH EXTRACTION      Social History   Socioeconomic History  . Marital status: Married    Spouse name: Pamala Hurry  . Number of children: 0  . Years of education: Not on file  . Highest education level: Not on file  Occupational History  . 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 Financial resource strain: Not on file  . Food insecurity:    Worry: Not on file    Inability: Not on file  . Transportation needs:    Medical: Not on file    Non-medical: Not on file  Tobacco Use  . Smoking status: Former Smoker    Packs/day: 0.50    Years: 2.00    Pack years: 1.00    Types: Cigarettes    Last attempt to quit: 1973    Years since quitting: 46.9  . Smokeless tobacco: Never Used  Substance and Sexual Activity  . Alcohol use: Yes    Alcohol/week: 3.0 standard drinks    Types: 3 Standard drinks or equivalent per week    Comment: beer or vodka a couple of times a week  . Drug use: Yes    Types: Marijuana    Comment: Last use Tues, 7/16 - smokes weekly  . Sexual activity: Not Currently  Lifestyle  . Physical activity:    Days per week: Not on file    Minutes per session: Not on file  . Stress: Not on file  Relationships  . Social connections:    Talks on phone: Not on file    Gets together: Not on file    Attends religious service: Not on file    Active member of club or organization: Not on file    Attends meetings of clubs or organizations: Not on file    Relationship status: Not on file  . Intimate partner violence:    Fear of current or ex partner: Not on file    Emotionally abused: Not on file    Physically abused: Not on file    Forced sexual activity: Not on file  Other Topics Concern  . Not on file  Social History Narrative  . Not on file    Family History  Problem Relation  Age of Onset  . Alzheimer's disease Father   . Diabetes Father   . Heart disease Father        age ?  Marland Kitchen Heart murmur Brother   . Hypertension Neg Hx   . Stroke Neg Hx   . Colon cancer Neg Hx   . Prostate cancer Neg Hx   . Stomach cancer Neg Hx   . Rectal  cancer Neg Hx     ROS: no fevers or chills, productive cough, hemoptysis, dysphasia, odynophagia, melena, hematochezia, dysuria, hematuria, rash, seizure activity, orthopnea, PND, pedal edema, claudication. Remaining systems are negative.  Physical Exam: Well-developed well-nourished in no acute distress.  Skin is warm and dry.  HEENT is normal.  Neck is supple.  Chest is clear to auscultation with normal expansion.  Cardiovascular exam is regular rate and rhythm.  Abdominal exam nontender or distended. No masses palpated. Extremities show no edema. neuro grossly intact  ECG-sinus rhythm with first-degree AV block, left bundle branch block.  Personally reviewed  A/P  1 hypertrophic obstructive cardiomyopathy-patient denies symptoms of increased dyspnea or chest pain.  Continue present dose of beta-blocker and disopyramide.  Repeat echocardiogram.  Note patient has no history of syncope and no family history of sudden death.  Previous exercise treadmill showed normal increase in systolic blood pressure with exercise.  Previous Holter monitor showed 3 beats of nonsustained ventricular tachycardia.  Risk of sudden death appears to be low particularly given age greater than 58.  2 abdominal aortic aneurysm-plan follow-up ultrasound Jan 2021.  3 history of atrial fibrillation-this was noted on a monitor in the remote past.  He remains in sinus rhythm and has previously declined anticoagulation.  Patient understands higher risk of CVA off of anticoagulation.  Continue disopyramide.  4 history of moderate aortic insufficiency-repeat echocardiogram.  5 thoracic aortic aneurysm-mildly dilated on previous MRI and echocardiogram.  Repeat  echocardiogram to further assess.  Kirk Ruths, MD

## 2018-04-22 ENCOUNTER — Other Ambulatory Visit: Payer: Self-pay | Admitting: Internal Medicine

## 2018-04-22 NOTE — Telephone Encounter (Signed)
Letter mailed informing due for visit.

## 2018-04-22 NOTE — Telephone Encounter (Signed)
Pt is requesting refill on clonazepam.   Last OV: 09/20/2017 Last Fill: 03/11/2018 #45 and 0RF UDS: None  NCCR printed- no discrepancies noted- sent for scanning

## 2018-04-22 NOTE — Telephone Encounter (Signed)
RF sent, let him know needs a OV before next RF

## 2018-04-29 ENCOUNTER — Ambulatory Visit: Payer: Medicare Other | Admitting: Cardiology

## 2018-04-29 ENCOUNTER — Encounter: Payer: Self-pay | Admitting: Cardiology

## 2018-04-29 VITALS — BP 110/66 | HR 65 | Ht 74.0 in | Wt 215.0 lb

## 2018-04-29 DIAGNOSIS — I351 Nonrheumatic aortic (valve) insufficiency: Secondary | ICD-10-CM | POA: Diagnosis not present

## 2018-04-29 DIAGNOSIS — I714 Abdominal aortic aneurysm, without rupture, unspecified: Secondary | ICD-10-CM

## 2018-04-29 DIAGNOSIS — I421 Obstructive hypertrophic cardiomyopathy: Secondary | ICD-10-CM

## 2018-04-29 MED ORDER — ASPIRIN EC 81 MG PO TBEC: 81.0000 mg | DELAYED_RELEASE_TABLET | Freq: Every day | ORAL | 3 refills | Status: AC

## 2018-04-29 NOTE — Patient Instructions (Signed)
Medication Instructions:  DECREASE ASPIRIN TO 81 MG ONCE DAILY If you need a refill on your cardiac medications before your next appointment, please call your pharmacy.   Lab work: If you have labs (blood work) drawn today and your tests are completely normal, you will receive your results only by: Marland Kitchen MyChart Message (if you have MyChart) OR . A paper copy in the mail If you have any lab test that is abnormal or we need to change your treatment, we will call you to review the results.  Testing/Procedures: Your physician has requested that you have an echocardiogram. Echocardiography is a painless test that uses sound waves to create images of your heart. It provides your doctor with information about the size and shape of your heart and how well your heart's chambers and valves are working. This procedure takes approximately one hour. There are no restrictions for this procedure.    Follow-Up: At [facility], you and your health needs are our priority.  As part of our continuing mission to provide you with exceptional heart care, we have created designated Provider Care Teams.  These Care Teams include your primary Cardiologist (physician) and Advanced Practice Providers (APPs -  Physician Assistants and Nurse Practitioners) who all work together to provide you with the care you need, when you need it. You will need a follow up appointment in 12 months.  Please call our office 2 months in advance to schedule this appointment.  You may see Kirk Ruths MD or one of the following Advanced Practice Providers on your designated Care Team:   Kerin Ransom, PA-C Roby Lofts, [place] . Sande Rives, PA-C

## 2018-05-05 ENCOUNTER — Ambulatory Visit (HOSPITAL_COMMUNITY): Payer: Medicare Other | Attending: Cardiology

## 2018-05-05 ENCOUNTER — Other Ambulatory Visit: Payer: Self-pay

## 2018-05-05 DIAGNOSIS — I421 Obstructive hypertrophic cardiomyopathy: Secondary | ICD-10-CM | POA: Diagnosis present

## 2018-05-06 ENCOUNTER — Other Ambulatory Visit: Payer: Self-pay | Admitting: Neurology

## 2018-05-06 ENCOUNTER — Other Ambulatory Visit: Payer: Self-pay | Admitting: Internal Medicine

## 2018-05-30 ENCOUNTER — Telehealth: Payer: Self-pay

## 2018-05-30 NOTE — Telephone Encounter (Signed)
Certificate faxed to [employer] at [phone]. Informed Rodena Piety that original is ready for pick up. Copy of form sent for scanning.

## 2018-05-30 NOTE — Telephone Encounter (Signed)
Rodena Piety calling to check status. Time sensitive. Please advise Cb#[phone]

## 2018-05-30 NOTE — Telephone Encounter (Signed)
Spoke w/ Rodena Piety- asked that she re-fax- informed that we were unaware that Pt passed away until yesterday- informed provider will probably have to call family to see what happened. Rodena Piety- informed that Pt passed away at a friends house and informed that medical examiner is also signing off on death certificate. She will be faxing to [phone] and [phone].

## 2018-05-30 NOTE — Telephone Encounter (Signed)
Copied from [place] [phone]. Topic: General - Deceased Patient >> Jun 10, 2018 10:19 AM Berneta Levins wrote: Reason for CRM:  Rodena Piety with [employer] calling.  Stated that she faxed over a death certificate yesterday and they needed it signed and faxed back.  States that this is necessary for pt to be cremated.  They will be sending over the original today.  Rodena Piety had to hang up and stated she would call back in a bit to find out what is going on.  Route to department's PEC Pool.

## 2018-05-30 NOTE — Telephone Encounter (Signed)
I spoke with the funeral director Richardson Landry), EMS chief commander at [phone] as well as the patient's wife. On 06-06-18, he was at a friend's house with his wife and without warning he suddenly collapsed. EMS and the sheriff patrol arrived They attempted resuscitation for more than 45 minutes but never got him back. The way out tries to remove the body. Based on this information I feel comfortable saying that this was a natural death and I will sign the death certificate. Prior to cremation, the medical examiner will sign the paperwork according to the funeral director. Condolences provided to the patient's wife She would like to come to the office and talk to me in the next several days and that is perfectly fine.Marland Kitchen

## 2018-05-30 NOTE — Telephone Encounter (Signed)
Copied from [place] [id number]. Topic: Quick Communication - See Telephone Encounter >> May 30, 2018 11:54 AM Rosalin Hawking wrote: CRM for notification. See Telephone encounter for: 05/30/18.   [place] dropped off death certificate for Provider. Document given to CMA.

## 2018-05-30 NOTE — Telephone Encounter (Signed)
Form received- forwarded to PCP.

## 2018-06-28 DEATH — deceased

## 2018-08-08 ENCOUNTER — Ambulatory Visit: Payer: Medicare Other | Admitting: Neurology

## 2018-09-26 ENCOUNTER — Ambulatory Visit: Payer: Medicare Other | Admitting: *Deleted
